# Patient Record
Sex: Male | Born: 1945 | Race: White | Hispanic: No | Smoking: Never smoker
Health system: Southern US, Community
[De-identification: ages and names within clinical notes are randomized; demographics above are authoritative.]

## PROBLEM LIST (undated history)

## (undated) ENCOUNTER — Emergency Department (HOSPITAL_COMMUNITY): Payer: Self-pay | Source: Home / Self Care

## (undated) DIAGNOSIS — I00 Rheumatic fever without heart involvement: Secondary | ICD-10-CM

## (undated) DIAGNOSIS — Q211 Atrial septal defect, unspecified: Secondary | ICD-10-CM

## (undated) DIAGNOSIS — I639 Cerebral infarction, unspecified: Secondary | ICD-10-CM

## (undated) DIAGNOSIS — E7800 Pure hypercholesterolemia, unspecified: Secondary | ICD-10-CM

## (undated) DIAGNOSIS — M545 Low back pain, unspecified: Secondary | ICD-10-CM

## (undated) DIAGNOSIS — H269 Unspecified cataract: Secondary | ICD-10-CM

## (undated) DIAGNOSIS — H9190 Unspecified hearing loss, unspecified ear: Secondary | ICD-10-CM

## (undated) DIAGNOSIS — IMO0001 Reserved for inherently not codable concepts without codable children: Secondary | ICD-10-CM

## (undated) DIAGNOSIS — M25519 Pain in unspecified shoulder: Secondary | ICD-10-CM

## (undated) DIAGNOSIS — M170 Bilateral primary osteoarthritis of knee: Secondary | ICD-10-CM

## (undated) DIAGNOSIS — I10 Essential (primary) hypertension: Secondary | ICD-10-CM

## (undated) DIAGNOSIS — H547 Unspecified visual loss: Secondary | ICD-10-CM

## (undated) HISTORY — DX: Bilateral primary osteoarthritis of knee: M17.0

## (undated) HISTORY — DX: Unspecified hearing loss, unspecified ear: H91.90

## (undated) HISTORY — DX: Reserved for inherently not codable concepts without codable children: IMO0001

## (undated) HISTORY — PX: ASD REPAIR: SHX258

## (undated) HISTORY — DX: Essential (primary) hypertension: I10

## (undated) HISTORY — DX: Pure hypercholesterolemia, unspecified: E78.00

## (undated) HISTORY — PX: KNEE SURGERY: SHX244

## (undated) HISTORY — DX: Unspecified visual loss: H54.7

## (undated) HISTORY — DX: Cerebral infarction, unspecified: I63.9

## (undated) HISTORY — PX: OTHER SURGICAL HISTORY: SHX169

## (undated) HISTORY — PX: CATARACT EXTRACTION: SUR2

---
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
## 1977-12-30 HISTORY — PX: APPENDECTOMY: SHX54

## 2007-02-26 ENCOUNTER — Ambulatory Visit: Payer: Self-pay | Admitting: Internal Medicine

## 2007-03-05 ENCOUNTER — Ambulatory Visit: Payer: Self-pay | Admitting: *Deleted

## 2007-03-17 ENCOUNTER — Ambulatory Visit: Payer: Self-pay | Admitting: Internal Medicine

## 2007-12-16 ENCOUNTER — Ambulatory Visit: Payer: Self-pay | Admitting: Internal Medicine

## 2007-12-31 HISTORY — PX: TOTAL KNEE ARTHROPLASTY: SHX125

## 2008-01-04 ENCOUNTER — Ambulatory Visit: Payer: Self-pay | Admitting: Internal Medicine

## 2008-01-04 LAB — CONVERTED CEMR LAB
Calcium: 9.4 mg/dL (ref 8.4–10.5)
Creatinine, Ser: 1.06 mg/dL (ref 0.40–1.50)
Potassium: 4.5 meq/L (ref 3.5–5.3)
Sodium: 142 meq/L (ref 135–145)

## 2008-02-01 ENCOUNTER — Ambulatory Visit: Payer: Self-pay | Admitting: Internal Medicine

## 2008-02-02 ENCOUNTER — Ambulatory Visit (HOSPITAL_COMMUNITY): Admission: RE | Admit: 2008-02-02 | Discharge: 2008-02-02 | Payer: Self-pay | Admitting: Cardiovascular Disease

## 2008-02-15 ENCOUNTER — Ambulatory Visit: Payer: Self-pay | Admitting: Internal Medicine

## 2008-03-14 DIAGNOSIS — I639 Cerebral infarction, unspecified: Secondary | ICD-10-CM

## 2008-03-14 HISTORY — DX: Cerebral infarction, unspecified: I63.9

## 2008-03-15 ENCOUNTER — Encounter: Admission: RE | Admit: 2008-03-15 | Discharge: 2008-03-15 | Payer: Self-pay | Admitting: Orthopedic Surgery

## 2008-03-17 ENCOUNTER — Inpatient Hospital Stay (HOSPITAL_COMMUNITY): Admission: RE | Admit: 2008-03-17 | Discharge: 2008-03-20 | Payer: Self-pay | Admitting: Orthopedic Surgery

## 2008-03-21 ENCOUNTER — Emergency Department (HOSPITAL_COMMUNITY): Admission: EM | Admit: 2008-03-21 | Discharge: 2008-03-21 | Payer: Self-pay | Admitting: Emergency Medicine

## 2008-03-31 ENCOUNTER — Encounter (INDEPENDENT_AMBULATORY_CARE_PROVIDER_SITE_OTHER): Payer: Self-pay | Admitting: Internal Medicine

## 2008-03-31 ENCOUNTER — Ambulatory Visit: Payer: Self-pay | Admitting: Vascular Surgery

## 2008-03-31 ENCOUNTER — Inpatient Hospital Stay (HOSPITAL_COMMUNITY): Admission: EM | Admit: 2008-03-31 | Discharge: 2008-04-05 | Payer: Self-pay | Admitting: Emergency Medicine

## 2008-04-04 ENCOUNTER — Ambulatory Visit: Payer: Self-pay | Admitting: Psychiatry

## 2008-04-05 ENCOUNTER — Inpatient Hospital Stay (HOSPITAL_COMMUNITY): Admission: AD | Admit: 2008-04-05 | Discharge: 2008-04-13 | Payer: Self-pay | Admitting: *Deleted

## 2008-04-05 ENCOUNTER — Ambulatory Visit: Payer: Self-pay | Admitting: *Deleted

## 2008-05-03 ENCOUNTER — Encounter: Admission: RE | Admit: 2008-05-03 | Discharge: 2008-05-24 | Payer: Self-pay | Admitting: Orthopedic Surgery

## 2008-05-25 ENCOUNTER — Ambulatory Visit: Payer: Self-pay | Admitting: Internal Medicine

## 2008-10-31 ENCOUNTER — Ambulatory Visit: Payer: Self-pay | Admitting: *Deleted

## 2008-11-03 ENCOUNTER — Encounter: Admission: RE | Admit: 2008-11-03 | Discharge: 2008-11-03 | Payer: Self-pay | Admitting: Orthopedic Surgery

## 2009-01-11 ENCOUNTER — Encounter: Payer: Self-pay | Admitting: Family Medicine

## 2009-01-11 ENCOUNTER — Ambulatory Visit: Payer: Self-pay | Admitting: Internal Medicine

## 2009-01-11 LAB — CONVERTED CEMR LAB
ALT: 18 units/L (ref 0–53)
AST: 17 units/L (ref 0–37)
Alkaline Phosphatase: 63 units/L (ref 39–117)
Calcium: 9.5 mg/dL (ref 8.4–10.5)
Creatinine, Ser: 0.99 mg/dL (ref 0.40–1.50)
Glucose, Bld: 90 mg/dL (ref 70–99)
HDL: 33 mg/dL — ABNORMAL LOW (ref >39)
Potassium: 4.4 meq/L (ref 3.5–5.3)
Sodium: 140 meq/L (ref 135–145)
Total CHOL/HDL Ratio: 4.1
Total Protein: 7.2 g/dL (ref 6.0–8.3)
Triglycerides: 138 mg/dL (ref <150)
VLDL: 28 mg/dL (ref 0–40)

## 2009-01-13 ENCOUNTER — Ambulatory Visit: Payer: Self-pay | Admitting: Internal Medicine

## 2009-01-18 ENCOUNTER — Ambulatory Visit (HOSPITAL_COMMUNITY): Admission: RE | Admit: 2009-01-18 | Discharge: 2009-01-18 | Payer: Self-pay | Admitting: Family Medicine

## 2009-01-19 ENCOUNTER — Encounter: Admission: RE | Admit: 2009-01-19 | Discharge: 2009-02-20 | Payer: Self-pay | Admitting: Family Medicine

## 2009-02-28 ENCOUNTER — Ambulatory Visit: Payer: Self-pay | Admitting: Internal Medicine

## 2009-02-28 ENCOUNTER — Encounter: Payer: Self-pay | Admitting: Family Medicine

## 2009-02-28 LAB — CONVERTED CEMR LAB
Alkaline Phosphatase: 76 units/L (ref 39–117)
BUN: 22 mg/dL (ref 6–23)
Band Neutrophils: 0 % (ref 0–10)
Basophils Absolute: 0 10*3/uL (ref 0.0–0.1)
CO2: 24 meq/L (ref 19–32)
Calcium: 9.2 mg/dL (ref 8.4–10.5)
Chloride: 102 meq/L (ref 96–112)
Cholesterol: 130 mg/dL (ref 0–200)
Creatinine, Ser: 0.97 mg/dL (ref 0.40–1.50)
Glucose, Bld: 73 mg/dL (ref 70–99)
HDL: 30 mg/dL — ABNORMAL LOW (ref >39)
Hemoglobin: 13.4 g/dL (ref 13.0–17.0)
LDL Cholesterol: 68 mg/dL (ref 0–99)
Lymphs Abs: 2.1 10*3/uL (ref 0.7–4.0)
Monocytes Absolute: 0.7 10*3/uL (ref 0.1–1.0)
Monocytes Relative: 10 % (ref 3–12)
Neutro Abs: 3.8 10*3/uL (ref 1.7–7.7)
Platelets: 220 10*3/uL (ref 150–400)
Sodium: 139 meq/L (ref 135–145)
Total CHOL/HDL Ratio: 4.3
VLDL: 32 mg/dL (ref 0–40)

## 2009-03-10 ENCOUNTER — Ambulatory Visit: Payer: Self-pay | Admitting: Internal Medicine

## 2009-07-07 ENCOUNTER — Ambulatory Visit: Payer: Self-pay | Admitting: Internal Medicine

## 2009-07-12 ENCOUNTER — Ambulatory Visit: Payer: Self-pay | Admitting: Family Medicine

## 2009-09-22 IMAGING — CR DG CHEST 2V
2 series · 2 of 2 positions shown · non-contrast
Comparison: None.

CLINICAL DATA: Hypertension. Preoperative evaluation.
 CHEST - 2 VIEW:

[w chest pa]
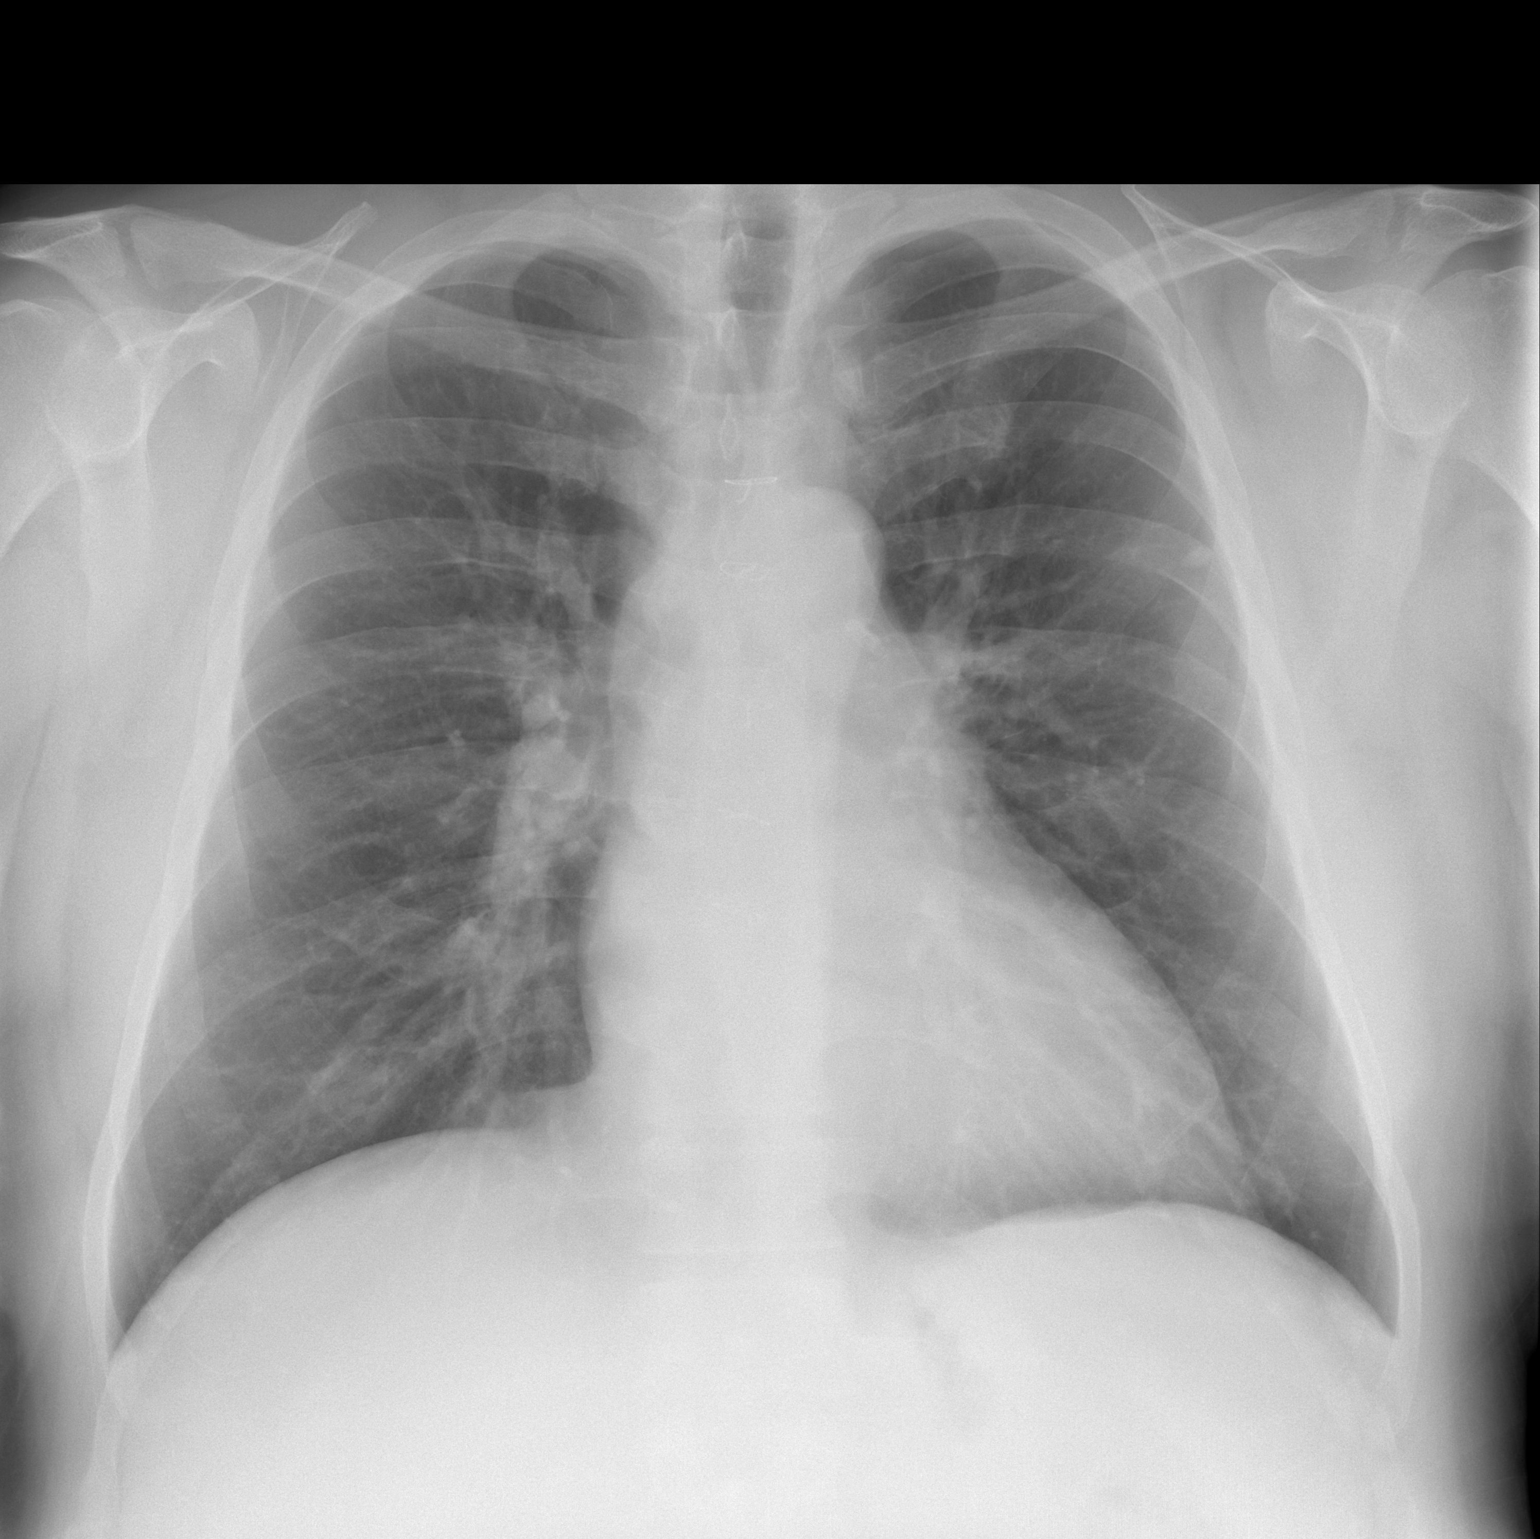

[w chest lat]
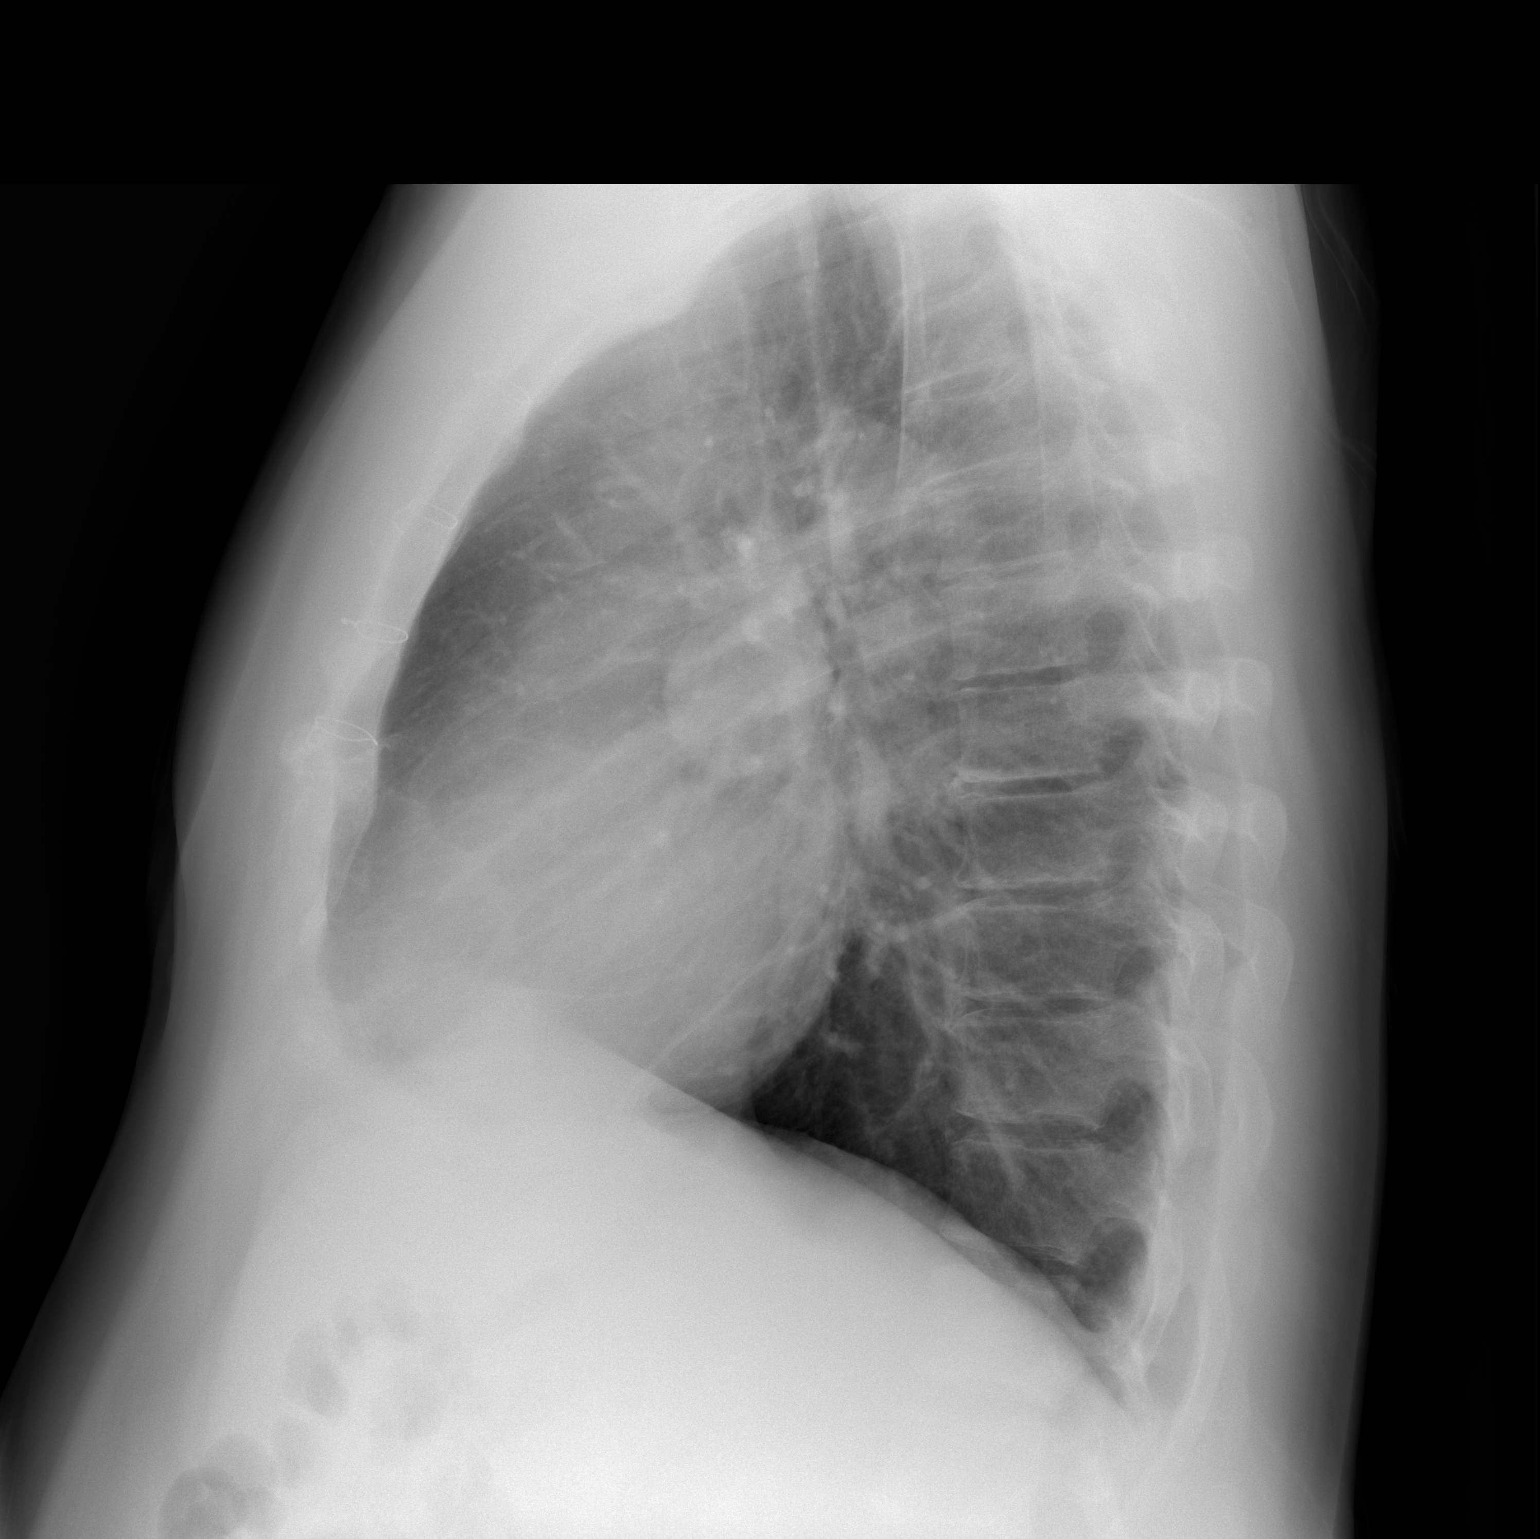

[2 of 2 positions shown; findings below may reference images not displayed]

FINDINGS: PA and lateral examinations are submitted for interpretation.
 The cardiac density is borderline in size. An 8 mm pulmonary nodular density projects over the lateral aspect of the posterior portion of the left 6th rib.  No other nodular opacities are seen. No pulmonary infiltrates or evident. There is no evidence of pleural effusion. There is slight flattening of the diaphragm on the lateral view consistent with minimal hyperinflation. There is minimal osteophyte formation of the spine representing degenerative spondylosis compatible with age.
IMPRESSION: 1.  Slight hyperinflation with no evidence of pulmonary edema or pneumonia.

## 2009-09-27 IMAGING — CT CT CHEST W/O CM
2 of 4 series · 15 of 36 positions shown, 18 images · IV contrast (agent unspecified)
Comparison: Chest x-ray, 03/10/08.

CLINICAL DATA: 61 year old with abnormal chest x-ray.  
CHEST CT WITHOUT CONTRAST:
TECHNIQUE: Multidetector CT imaging of the chest was performed following the standard protocol without IV contrast.

[Series 3: routine chest · axial · 0.70mm/px · z∈[-304,-19]mm · 12 of 67 slices shown, 15 images]
[im 5/67  mediastinal]
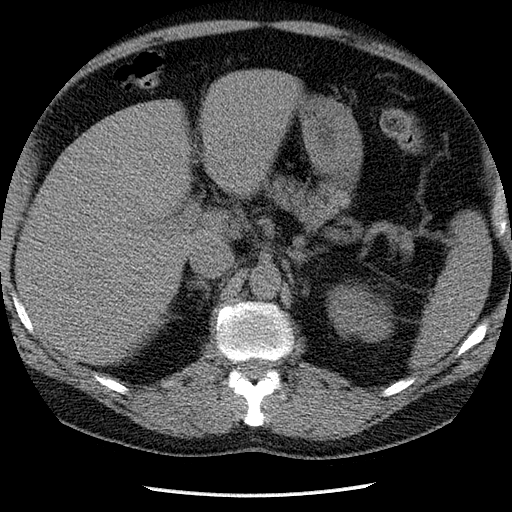
[im 5/67  lung]
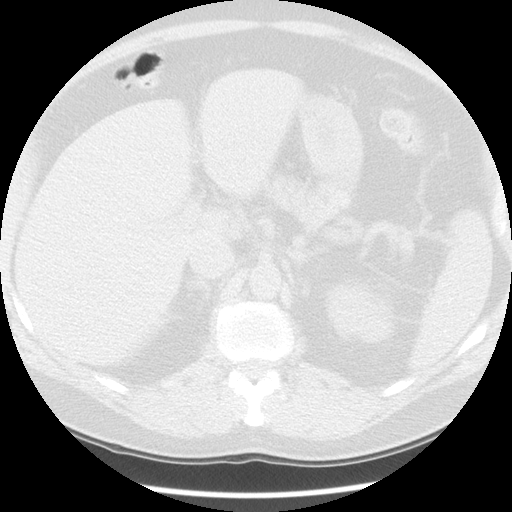
[im 10/67  lung]
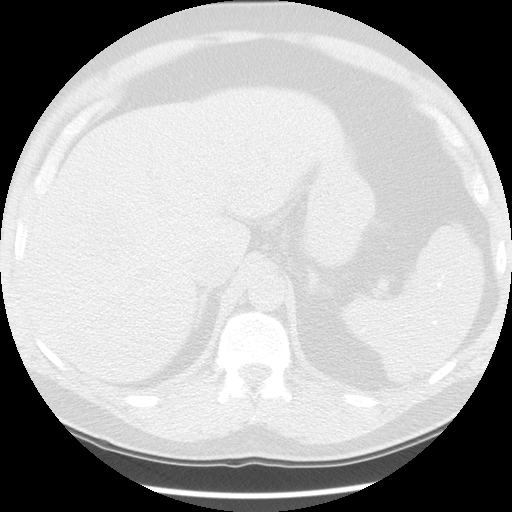
[im 15/67  lung]
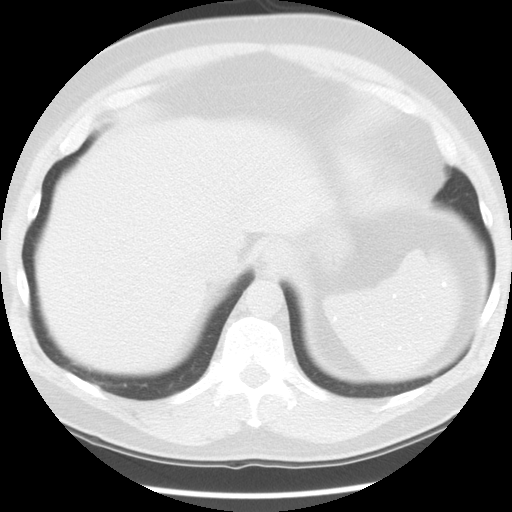
[im 19/67  lung]
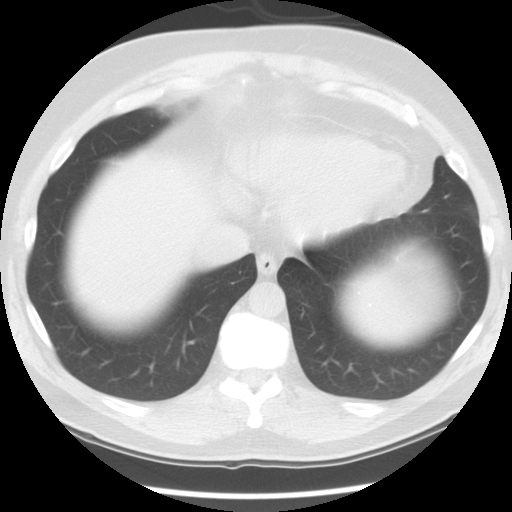
[im 24/67  mediastinal]
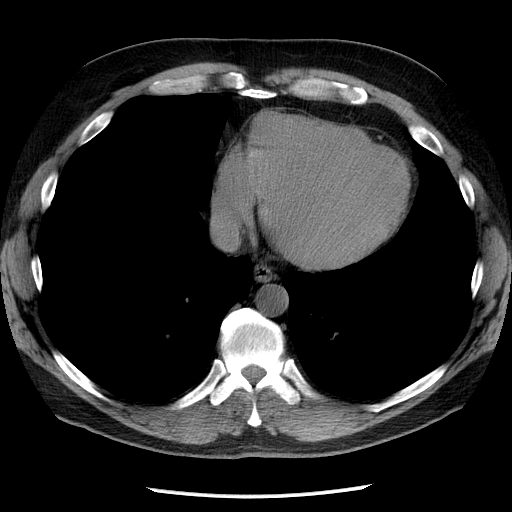
[im 24/67  lung]
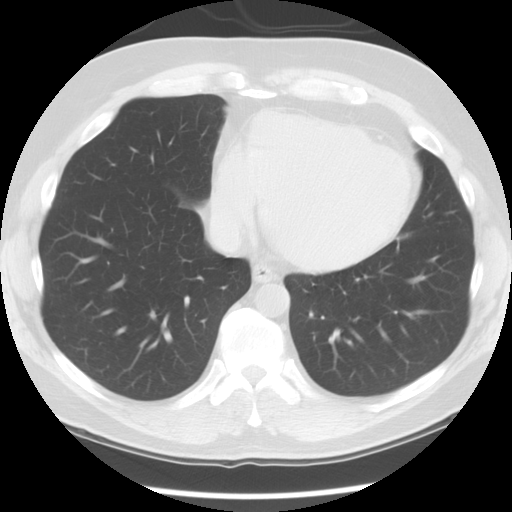
[im 29/67  lung]
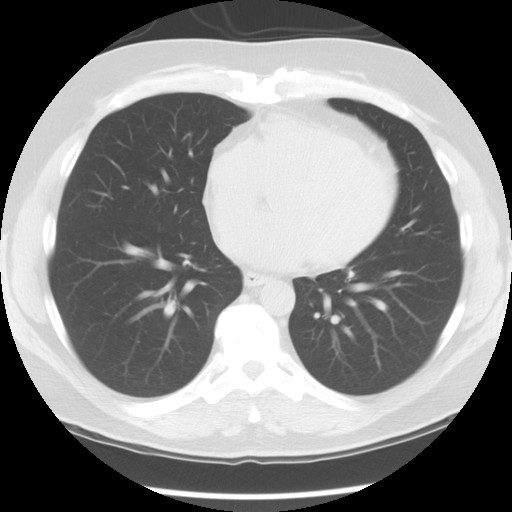
[im 38/67  lung]
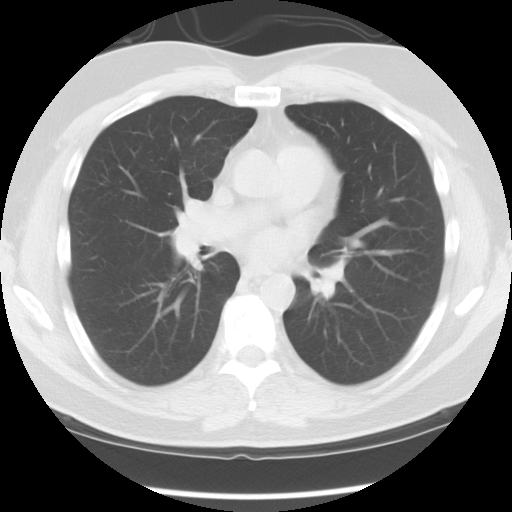
[im 43/67  lung]
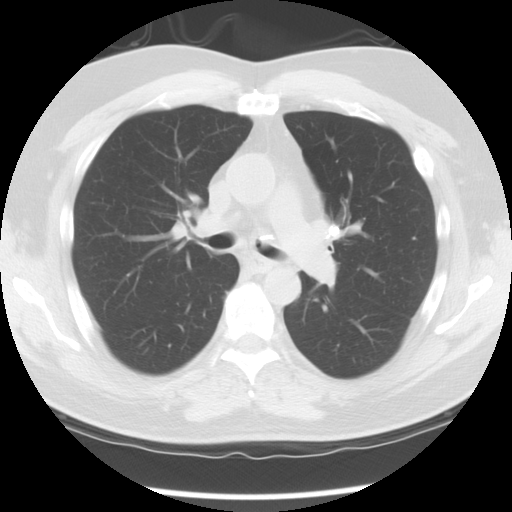
[im 48/67  mediastinal]
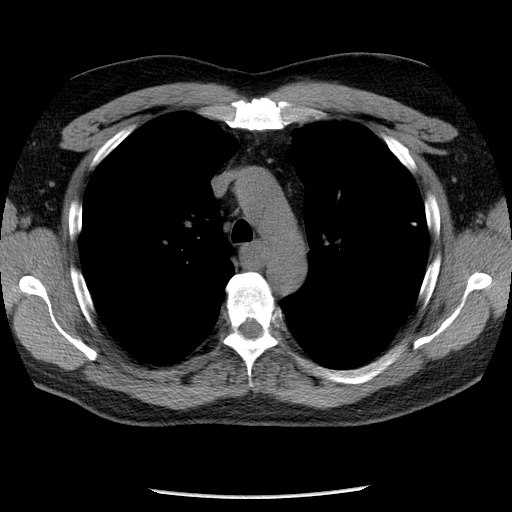
[im 48/67  lung]
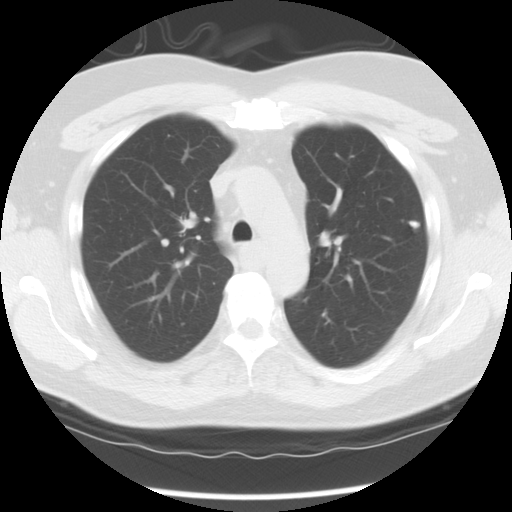
[im 52/67  lung]
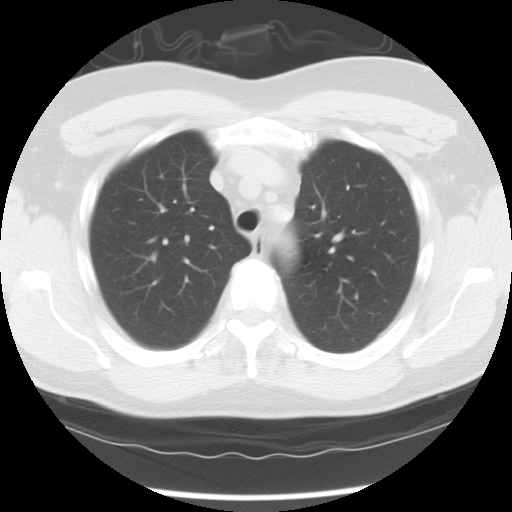
[im 57/67  lung]
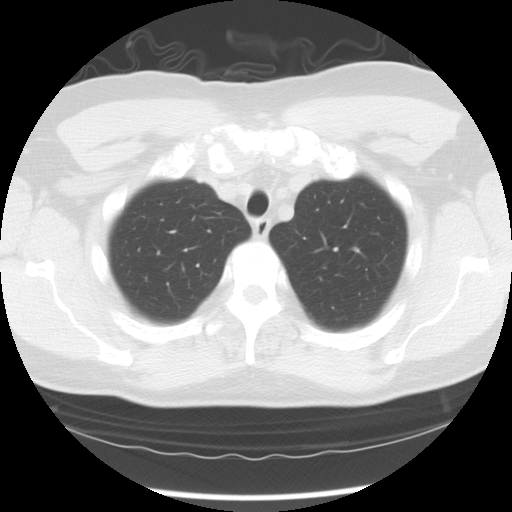
[im 62/67  lung]
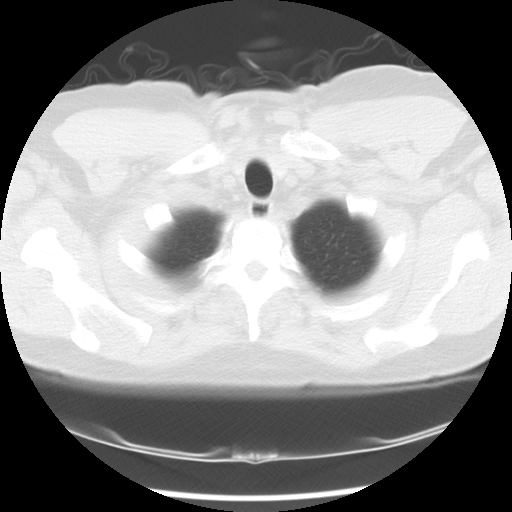

[Series 602: sagittal body · sagittal · 0.70mm/px · 3 of 145 slices shown]
[im 29/145  lung]
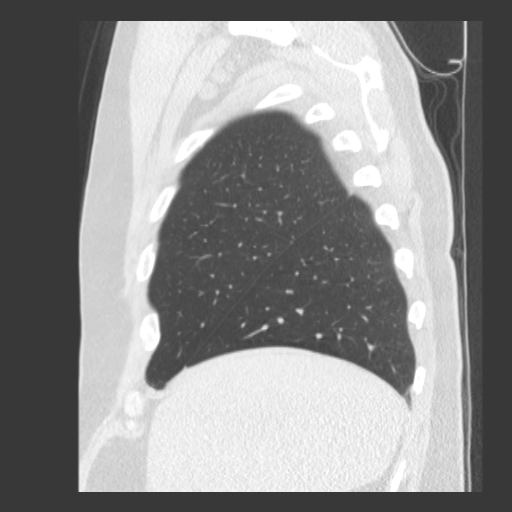
[im 58/145  lung]
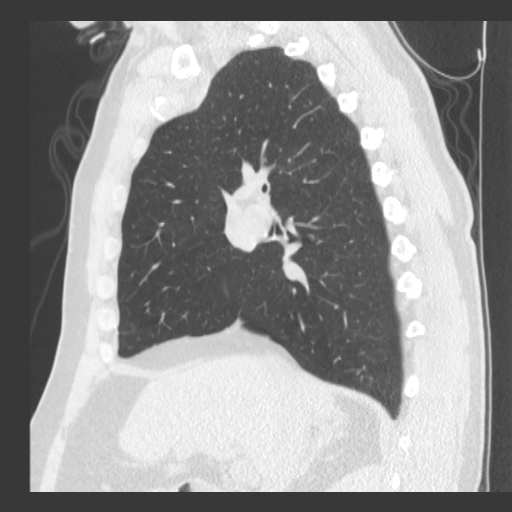
[im 87/145  lung]
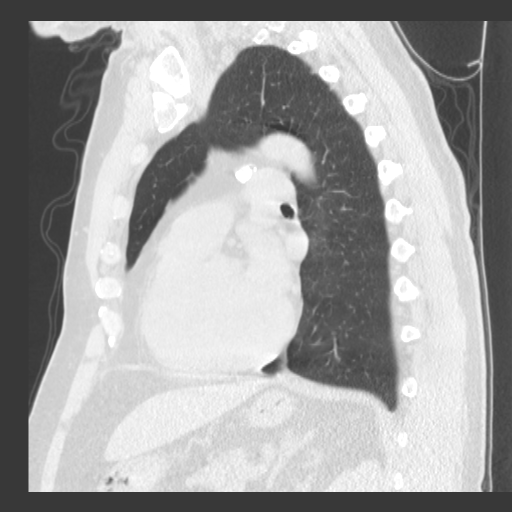

[15 of 36 positions shown; findings below may reference images not displayed]

FINDINGS: The patient has scattered calcifications throughout the mediastinum.  Evidence for a calcified node in the prevascular space.  Overall, there is no significant chest lymphadenopathy.  Negative for pericardial or pleural effusions.  The patient has undergone a median sternotomy in the past.  There is some tenting of the anterior pericardial fat as well as the right atrium along the caudal margin of the sternum.  Suspect these findings are related to scarring from the previous surgery.  There is a normal appearance of the adrenal tissue.  Multiple punctate calcifications scattered throughout the splenic parenchyma.  There may be a small accessory spleen along the inferior margin of the spleen.  Evaluation of the upper abdominal structures is limited, however, there is slightly decreased attenuation in the anterior right hepatic lobe seen on sequence 3, image #58.  This area of low density is only appreciated with liver windows.  Difficult to exclude an underlying lesion. The trachea and mainstem bronchi are patent.  The area of concern on the recent chest radiograph represents a 7 mm calcification in the left upper lobe.  The lungs are otherwise clear with exception of a 3 mm nodular density along the top of the right major fissure on sequence 4, image #21.  No acute bony abnormalities.
IMPRESSION: 1.  The nodule in the left upper lobe is calcified and consistent with a benign granuloma.  There is evidence of old granulomatous disease throughout the mediastinum as well as the spleen. 
2.  No acute chest findings.
3.  There is a punctate density along the right major fissure that measures approximately 3 mm.  If the patient is low risk for malignancy, this does not require follow-up.  
4.  Decreased density in the right hepatic lobe concerning for an underlying lesion.  
.

## 2009-11-14 ENCOUNTER — Inpatient Hospital Stay (HOSPITAL_COMMUNITY): Admission: EM | Admit: 2009-11-14 | Discharge: 2009-11-15 | Payer: Self-pay | Admitting: Emergency Medicine

## 2009-12-13 ENCOUNTER — Ambulatory Visit: Payer: Self-pay | Admitting: Internal Medicine

## 2010-04-23 ENCOUNTER — Ambulatory Visit (HOSPITAL_COMMUNITY): Admission: RE | Admit: 2010-04-23 | Discharge: 2010-04-23 | Payer: Self-pay | Admitting: Internal Medicine

## 2010-04-23 ENCOUNTER — Ambulatory Visit: Payer: Self-pay | Admitting: Internal Medicine

## 2011-01-20 ENCOUNTER — Encounter: Payer: Self-pay | Admitting: Cardiovascular Disease

## 2011-02-26 ENCOUNTER — Encounter (INDEPENDENT_AMBULATORY_CARE_PROVIDER_SITE_OTHER): Payer: Self-pay | Admitting: *Deleted

## 2011-03-05 ENCOUNTER — Encounter (INDEPENDENT_AMBULATORY_CARE_PROVIDER_SITE_OTHER): Payer: Self-pay | Admitting: *Deleted

## 2011-03-07 ENCOUNTER — Encounter: Payer: Self-pay | Admitting: Internal Medicine

## 2011-03-07 NOTE — Letter (Signed)
Summary: Pre Visit Letter Revised  [facility]  [address] [place], [place] [address]   Phone: [phone]  Fax: [phone]        02/26/2011 MRN: [id number] Dean Fry [address] [place], [place]  [address]                          Procedure Date:  March 19, 2011                 dir col- Dr Juanda Chance                 Welcome to the Gastroenterology Division at [facility].    You are scheduled to see a nurse for your pre-procedure visit on March 05, 2011 at 1:30pm on the 3rd floor at [facility], [address]  We ask that you try to arrive at our office 15 minutes prior to your appointment time to allow for check-in.  Please take a minute to review the attached form.  If you answer "Yes" to one or more of the questions on the first page, we ask that you call the person listed at your earliest opportunity.  If you answer "No" to all of the questions, please complete the rest of the form and bring it to your appointment.    Your nurse visit will consist of discussing your medical and surgical history, your immediate family medical history, and your medications.   If you are unable to list all of your medications on the form, please bring the medication bottles to your appointment and we will list them.  We will need to be aware of both prescribed and over the counter drugs.  We will need to know exact dosage information as well.    Please be prepared to read and sign documents such as consent forms, a financial agreement, and acknowledgement forms.  If necessary, and with your consent, a friend or relative is welcome to sit-in on the nurse visit with you.  Please bring your insurance card so that we may make a copy of it.  If your insurance requires a referral to see a specialist, please bring your referral form from your primary care physician.  No co-pay is required for this nurse visit.     If you cannot keep your appointment, please  call [phone] to cancel or reschedule prior to your appointment date.  This allows [place] the opportunity to schedule an appointment for another patient in need of care.    Thank you for choosing  Gastroenterology for your medical needs.  We appreciate the opportunity to care for you.  Please visit [place] at our website  to learn more about our practice.  Sincerely, The Gastroenterology Division

## 2011-03-12 NOTE — Letter (Signed)
Summary: [facility] Instructions  [facility]  [address] [place], [place] [address]   Phone: [phone]  Fax: [phone]       Dean Fry    [birth year]    MRN: [id number]        Procedure Day Dorna Bloom: Jake Shark  03/19/11     Arrival Time:  2:00pm     Procedure Time: 3:00pm     Location of Procedure:                    _x _  [facility] (4th Floor)                       PREPARATION FOR COLONOSCOPY WITH MOVIPREP   Starting 5 days prior to your procedure  Thursday  03/15  do not eat nuts, seeds, popcorn, corn, beans, peas,  salads, or any raw vegetables.  Do not take any fiber supplements (e.g. Metamucil, Citrucel, and Benefiber).  THE DAY BEFORE YOUR PROCEDURE         DATE: MONDAY 03/19  1.  Drink clear liquids the entire day-NO SOLID FOOD  2.  Do not drink anything colored red or purple.  Avoid juices with pulp.  No orange juice.  3.  Drink at least 64 oz. (8 glasses) of fluid/clear liquids during the day to prevent dehydration and help the prep work efficiently.  CLEAR LIQUIDS INCLUDE: Water Jello Ice Popsicles Tea (sugar ok, no milk/cream) Powdered fruit flavored drinks Coffee (sugar ok, no milk/cream) Gatorade Juice: apple, white grape, white cranberry  Lemonade Clear bullion, consomm, broth Carbonated beverages (any kind) Strained chicken noodle soup Hard Candy                             4.  In the morning, mix first dose of MoviPrep solution:    Empty 1 Pouch A and 1 Pouch B into the disposable container    Add lukewarm drinking water to the top line of the container. Mix to dissolve    Refrigerate (mixed solution should be used within 24 hrs)  5.  Begin drinking the prep at 5:00 p.m. The MoviPrep container is divided by 4 marks.   Every 15 minutes drink the solution down to the next mark (approximately 8 oz) until the full liter is complete.   6.  Follow completed prep with 16 oz of clear liquid of your choice  (Nothing red or purple).  Continue to drink clear liquids until bedtime.  7.  Before going to bed, mix second dose of MoviPrep solution:    Empty 1 Pouch A and 1 Pouch B into the disposable container    Add lukewarm drinking water to the top line of the container. Mix to dissolve    Refrigerate  THE DAY OF YOUR PROCEDURE      DATE: TUESDAY  03/20  Beginning at  10:00 a.m. (5 hours before procedure):         1. Every 15 minutes, drink the solution down to the next mark (approx 8 oz) until the full liter is complete.  2. Follow completed prep with 16 oz. of clear liquid of your choice.    3. You may drink clear liquids until 1:00pm  (2 HOURS BEFORE PROCEDURE).   MEDICATION INSTRUCTIONS  Unless otherwise instructed, you should take regular prescription medications with a small sip of water   as early as possible the  morning of your procedure.   Additional medication instructions: Do not take Lisiniopril/HCTZ day of procedure.       OTHER INSTRUCTIONS  You will need a responsible adult at least 66 years of age to accompany you and drive you home.   This person must remain in the waiting room during your procedure.  Wear loose fitting clothing that is easily removed.  Leave jewelry and other valuables at home.  However, you may wish to bring a book to read or  an iPod/MP3 player to listen to music as you wait for your procedure to start.  Remove all body piercing jewelry and leave at home.  Total time from sign-in until discharge is approximately 2-3 hours.  You should go home directly after your procedure and rest.  You can resume normal activities the  day after your procedure.  The day of your procedure you should not:   Drive   Make legal decisions   Operate machinery   Drink alcohol   Return to work  You will receive specific instructions about eating, activities and medications before you leave.    The above instructions have been reviewed and  explained to me by   _______________________ Instructions reviewed w/ pt by Sadie Haber.  Computer was locked during PV.  Pt verbalized instructions   I fully understand and can verbalize these instructions _____________________________ Date _________

## 2011-03-12 NOTE — Miscellaneous (Signed)
Summary: [facility] PV  Clinical Lists Changes  Medications: Added new medication of MOVIPREP 100 GM  SOLR (PEG-KCL-NACL-NASULF-NA ASC-C) As per prep instructions. - Signed Rx of MOVIPREP 100 GM  SOLR (PEG-KCL-NACL-NASULF-NA ASC-C) As per prep instructions.;  #1 x 0;  Signed;  Entered by: Ezra Sites RN;  Authorized by: Hart Carwin MD;  Method used: Electronically to [facility] [address], [place], [place]  [id number], Ph: [phone], Fax: [phone] Observations: Added new observation of NKA: T (03/07/2011 14:27)    Prescriptions: MOVIPREP 100 GM  SOLR (PEG-KCL-NACL-NASULF-NA ASC-C) As per prep instructions.  #1 x 0   Entered by:   Ezra Sites RN   Authorized by:   Hart Carwin MD   Signed by:   Ezra Sites RN on 03/07/2011   Method used:   Electronically to        [facility] (retail)       [address]       [place], [place]  [id number]       Ph: [phone]       Fax: [phone]   RxID:   [id number]

## 2011-03-19 ENCOUNTER — Encounter: Payer: Self-pay | Admitting: Internal Medicine

## 2011-03-19 ENCOUNTER — Ambulatory Visit: Payer: Medicare Other | Admitting: Internal Medicine

## 2011-03-19 VITALS — BP 157/73 | HR 65 | Temp 98.9°F | Resp 16 | Ht 72.0 in | Wt 254.0 lb

## 2011-03-19 DIAGNOSIS — K5730 Diverticulosis of large intestine without perforation or abscess without bleeding: Secondary | ICD-10-CM

## 2011-03-19 DIAGNOSIS — Z1211 Encounter for screening for malignant neoplasm of colon: Secondary | ICD-10-CM

## 2011-03-19 NOTE — Patient Instructions (Signed)
MODERATE DIVERTICULOSIS IN THE SIGMOID COLON OTHERWISE NORMAL EXAM  PLEASE EAT A DIET THAT IS HIGH IN FIBER  REPEAT EXAM IN 10 YEARS-2022

## 2011-03-20 ENCOUNTER — Telehealth: Payer: Self-pay | Admitting: *Deleted

## 2011-03-20 NOTE — Telephone Encounter (Signed)

## 2011-03-28 NOTE — Procedures (Signed)
Summary: Colonoscopy  Patient: [name] Note: All result statuses are Final unless otherwise noted.  Tests: (1) Colonoscopy (COL)   COL Colonoscopy           DONE      [facility]     [address]     [place], [place]  [address]          COLONOSCOPY PROCEDURE REPORT          PATIENT:  Dean Fry, Dean Fry  MR#:  [id number]     BIRTHDATE:  November 20, 1946, 64 yrs. old  GENDER:  male     ENDOSCOPIST:  Hedwig Morton. Juanda Chance, MD     REF. BY:     PROCEDURE DATE:  03/19/2011     PROCEDURE:  Colonoscopy [address]     ASA CLASS:  Class I     INDICATIONS:  Routine Risk Screening     MEDICATIONS:   Versed 8 mg, Fentanyl 50 mcg          DESCRIPTION OF PROCEDURE:   After the risks benefits and     alternatives of the procedure were thoroughly explained, informed     consent was obtained.  Digital rectal exam was performed and     revealed no rectal masses.   The LB 180AL [id number] endoscope was     introduced through the anus and advanced to the cecum, which was     identified by both the appendix and ileocecal valve, without     limitations.  The quality of the prep was Moviprep fair.  The     instrument was then slowly withdrawn as the colon was fully     examined.<<PROCEDUREIMAGES>>          FINDINGS:  Moderate diverticulosis was found in the sigmoid colon     (see image1, image5, and image6).  This was otherwise a normal     examination of the colon (see image8, image4, image3, and image2).     Retroflexed views in the rectum revealed no abnormalities.    The     scope was then withdrawn from the patient and the procedure     completed.          COMPLICATIONS:  None     ENDOSCOPIC IMPRESSION:     1) Moderate diverticulosis in the sigmoid colon     2) Otherwise normal examination     RECOMMENDATIONS:     1) high fiber diet     REPEAT EXAM:  In 10 year(s) for.          ______________________________     Hedwig Morton. Juanda Chance, MD          CC:          n.     eSIGNED:   Hedwig Morton. Ashaya Raftery at 03/19/2011 03:37 PM          Brigandi, Mellody Dance, [phone]  Note: An exclamation mark (!) indicates a result that was not dispersed into the flowsheet. Document Creation Date: 03/19/2011 3:37 PM _______________________________________________________________________  (1) Order result status: Final Collection or observation date-time: 03/19/2011 15:13 Requested date-time:  Receipt date-time:  Reported date-time:  Referring Physician:   Ordering Physician: Lina Sar [id number]) Specimen Source:  Source: Launa Grill Order Number: [id number] Lab site:

## 2011-04-03 LAB — BASIC METABOLIC PANEL
BUN: 29 mg/dL — ABNORMAL HIGH (ref 6–23)
BUN: 43 mg/dL — ABNORMAL HIGH (ref 6–23)
BUN: 41 mg/dL — ABNORMAL HIGH (ref 6–23)
CO2: 17 mEq/L — ABNORMAL LOW (ref 19–32)
CO2: 20 mEq/L (ref 19–32)
CO2: 17 mEq/L — ABNORMAL LOW (ref 19–32)
Calcium: 8.4 mg/dL (ref 8.4–10.5)
Chloride: 106 mEq/L (ref 96–112)
Chloride: 112 mEq/L (ref 96–112)
Chloride: 105 mEq/L (ref 96–112)
Creatinine, Ser: 2.91 mg/dL — ABNORMAL HIGH (ref 0.4–1.5)
Creatinine, Ser: 3.64 mg/dL — ABNORMAL HIGH (ref 0.4–1.5)
GFR calc Af Amer: 27 mL/min — ABNORMAL LOW (ref >60)
GFR calc non Af Amer: 22 mL/min — ABNORMAL LOW (ref >60)
Glucose, Bld: 91 mg/dL (ref 70–99)
Potassium: 3.6 mEq/L (ref 3.5–5.1)
Potassium: 4.2 mEq/L (ref 3.5–5.1)
Sodium: 132 mEq/L — ABNORMAL LOW (ref 135–145)
Sodium: 138 mEq/L (ref 135–145)

## 2011-04-03 LAB — URINALYSIS, ROUTINE W REFLEX MICROSCOPIC
Glucose, UA: NEGATIVE mg/dL
Ketones, ur: 15 mg/dL — AB
Protein, ur: 100 mg/dL — AB

## 2011-04-03 LAB — COMPREHENSIVE METABOLIC PANEL
AST: 24 U/L (ref 0–37)
BUN: 40 mg/dL — ABNORMAL HIGH (ref 6–23)
CO2: 19 mEq/L (ref 19–32)
Chloride: 106 mEq/L (ref 96–112)
Creatinine, Ser: 3.73 mg/dL — ABNORMAL HIGH (ref 0.4–1.5)
GFR calc non Af Amer: 17 mL/min — ABNORMAL LOW (ref >60)
Glucose, Bld: 121 mg/dL — ABNORMAL HIGH (ref 70–99)
Total Bilirubin: 1.8 mg/dL — ABNORMAL HIGH (ref 0.3–1.2)

## 2011-04-03 LAB — DIFFERENTIAL
Basophils Absolute: 0 10*3/uL (ref 0.0–0.1)
Basophils Relative: 0 % (ref 0–1)
Eosinophils Absolute: 0.2 10*3/uL (ref 0.0–0.7)
Eosinophils Relative: 1 % (ref 0–5)
Lymphocytes Relative: 9 % — ABNORMAL LOW (ref 12–46)
Monocytes Absolute: 0.7 10*3/uL (ref 0.1–1.0)
Monocytes Relative: 7 % (ref 3–12)
Neutro Abs: 11.1 10*3/uL — ABNORMAL HIGH (ref 1.7–7.7)
Neutrophils Relative %: 85 % — ABNORMAL HIGH (ref 43–77)

## 2011-04-03 LAB — POCT CARDIAC MARKERS
Myoglobin, poc: >500 ng/mL (ref 12–200)
Troponin i, poc: <0.05 ng/mL (ref 0.00–0.09)

## 2011-04-03 LAB — CBC
HCT: 37.2 % — ABNORMAL LOW (ref 39.0–52.0)
HCT: 44.9 % (ref 39.0–52.0)
Hemoglobin: 13.6 g/dL (ref 13.0–17.0)
Hemoglobin: 15.6 g/dL (ref 13.0–17.0)
MCHC: 34 g/dL (ref 30.0–36.0)
MCHC: 34.6 g/dL (ref 30.0–36.0)
MCV: 87.7 fL (ref 78.0–100.0)
MCV: 87.9 fL (ref 78.0–100.0)
MCV: 87.4 fL (ref 78.0–100.0)
RBC: 4.24 MIL/uL (ref 4.22–5.81)
RBC: 4.54 MIL/uL (ref 4.22–5.81)
RBC: 5.14 MIL/uL (ref 4.22–5.81)
WBC: 7.3 10*3/uL (ref 4.0–10.5)
WBC: 13 10*3/uL — ABNORMAL HIGH (ref 4.0–10.5)

## 2011-04-03 LAB — URINE MICROSCOPIC-ADD ON

## 2011-04-03 LAB — POCT I-STAT, CHEM 8
Calcium, Ion: 1.13 mmol/L (ref 1.12–1.32)
Chloride: 111 mEq/L (ref 96–112)
HCT: 48 % (ref 39.0–52.0)

## 2011-04-03 LAB — LIPASE, BLOOD: Lipase: 20 U/L (ref 11–59)

## 2011-05-14 NOTE — Op Note (Signed)
NAME:  Dean Fry, Dean Fry           ACCOUNT NO.:  [id number]   MEDICAL RECORD NO.:  [id number]          PATIENT TYPE:  INP   LOCATION:  1606                         FACILITY:  [facility]   PHYSICIAN:  John L. Rendall, M.D.  DATE OF BIRTH:  Feb 08, 1946   DATE OF PROCEDURE:  03/17/2008  DATE OF DISCHARGE:                               OPERATIVE REPORT   PREOPERATIVE DIAGNOSIS:  End-stage osteoarthritis right knee, status  post high tibial osteotomy with lateral staple.   POSTOPERATIVE DIAGNOSIS:  End-stage osteoarthritis right knee, status  post high tibial osteotomy with lateral staple.   SURGICAL PROCEDURES:  Removal lateral stable and LCS total knee  replacement using computer navigation assistance.   SURGEON:  John L. Rendall, M.D.   ASSISTANT:  Legrand Pitts. Duffy, P.A.C.   ANESTHESIA:  General.   PATHOLOGY:  The patient has had a proximal high tibial closing osteotomy  with the lateral step staple.  The step staple was been in now for  virtually 20 years and is overgrown with bone.  In addition, the total  knee has abundant scar fibrosis and spurring and enlargement of the  lateral femoral and medial femoral condyle posteriorly causing great  deal of difficulty and final balancing and debridement of the knee.   PROCEDURE:  Under general anesthesia, the right leg was prepared with  DuraPrep and draped as a sterile field with using a proximal thigh  sterile tourniquet.  Legs wrapped out with the Esmarch and tourniquet is  used at 350 mm.  First the portion of the old lateral tibial osteotomy  incision is reopened approximately an inch and half of it.  The step  staple is identified beneath the periosteum and dissection about it is  done.  It was necessary to make a vertical incision towards the medial  side to get down to the length of the step staple.  Once it was  undermined, it was grasped with pliers and literally pulled straight  out.  Fortunately, it came out intact but it took  considerable removal  of bone overgrowth to get it ready and considerable undermining to work  it loose as it was solidly fixed.  This took about 30 minutes.  Once  this was completed, attention was turned to the joint itself.  A midline  incision was made going deep medial parapatellar.  The patella was  everted.  The femur was sized to a large plus.  Debridement was done in  preparation for computer mapping.  Then two Schanz pins were placed  through accessory punctures in the superior medial tibia and  inferomedial femur.  The arrays were set up.  The femoral head and  medial and lateral malleoli were identified.  Proximal tibia and distal  femur were mapped, all within 0.5 mm of reproducible accuracy.  Once  this was completed, the proximal tibia was resected using the computer  as a guide to remove 9 mm of proximal tibia.  This was a greatly helpful  as the proximal tibia was very deformed by the old osteotomy.  The pins  were left in place in case it was necessary  to remove even more bone  later because of the extensive scarring about the posterior and medial  side of the knee.  Once the proximal tibia was resected and the large  fragment removed, balancing was carried out both in flexion and  extension using the first femoral guide, anterior and posterior flare of  the distal femur were resected and at this point it was noted that it  was very difficult to even get a 10 mm spacer in the flexion gap.  An  additional 2.5 mm were taken off the proximal tibia at this point.  Then  the balancing block worked well at the flexion gap.  The extension cut  was then made using the distal femoral guide.  Once this was completed,  the lamina spreader was inserted.  Remnants of menisci and cruciates  were resected.  A horrendous amount of spurring was encountered both in  the inner condylar notch and posteriorly.  As this spurring and the  totally buried PCL were essentially dugout from  overhanging spur, the  knee developed better range of motion and came out into extension  better.  At this point, the recessing guide was used.  The proximal  tibia was then exposed.  It fit a #5, first a number 10 bearing was  inserted and on top of the #5 tibial plate.  With this in place, the  femoral component was inserted.  It should be noted that in flexion the  tibial tray flipped up in front and this was felt to be a result of bone  spurs on the back of the femur.  Everything was removed at this point  and more debridement of the back of the femoral condyles was done  removing at least 2 x 1 cm additional spurring on either side.  Once  this was completed, the posterior recesses were freed up much better and  a 12.5 bearing at this point went in quite nicely.  The patella was  osteotomized and the three peg hole patella was inserted.  Trial  reduction of all components at this time reveals excellent alignment  within 1.5 degrees of anatomic axis and full extension.  Permanent  components were obtained.  All were then cemented in place.  After  excess cement was removed, the tourniquet was let down at 2 hours 10  minutes.  Multiple small vessels were cauterized.  a medium Hemovac was  inserted.  The knee was then closed in layers with #1 Tycron, #1 Vicryl,  2-0 Vicryl and skin clips.  The patient returned to recovery in good  condition.      John L. Rendall, M.D.  Electronically Signed     JLR/MEDQ  D:  03/17/2008  T:  03/17/2008  Job:  [id number]

## 2011-05-14 NOTE — Discharge Summary (Signed)
NAME:  DERK, DOUBEK           ACCOUNT NO.:  [id number]   MEDICAL RECORD NO.:  [id number]          PATIENT TYPE:  INP   LOCATION:  1515                         FACILITY:  [facility]   PHYSICIAN:  John L. Rendall, M.D.  DATE OF BIRTH:  1946/03/25   DATE OF ADMISSION:  03/17/2008  DATE OF DISCHARGE:  03/20/2008                               DISCHARGE SUMMARY   ADMISSION DIAGNOSES:  1. End-stage osteoarthritis right knee.  2. Hypertension.  3. History of atrial septal defect treated with surgery.  4. Dyslipidemia.  5. Hard of hearing.   DISCHARGE DIAGNOSES:  1. End-stage osteoarthritis right knee status post right total knee      arthroplasty.  2. Acute blood loss anemia secondary to surgery.  3. Possible liver lesion.  4. Hypertension.  5. History of atrioseptal defect treated with surgery.  6. Dyslipidemia.  7. Hard of hearing.   SURGICAL PROCEDURES:  1. On March 17, 2008 Mr. Osterlund underwent a removal of an HTO      staple and a right total knee arthroplasty with computer navigation      by Dr. Jonny Ruiz L.  Rendall assisted by Arnoldo Morale, PA-C.  He had a      DePuy LPS complete primary femoral component cemented size large      plus right, placed with an LPS complete RP, insert size large plus,      12.5-mm thickness.  A DePuy NBT keel tibial tray cemented size 5      stem, and a metal back patella cemented size large plus.   COMPLICATIONS:  None.   CONSULTANTS:  1. Physical therapy and case management consult March 18, 2008.  2. Occupational therapy consult March 19, 2008.   HISTORY OF PRESENT ILLNESS:  This 65 year old white male patient  presented to Dr. Priscille Kluver with a 20-year history of gradual onset  progressive right knee pain.  He has a history of an old injury and  surgery on the knee in 1969, 1990, and 1997.  The pain, now, is a  constant ache-to-throb, grade 2-9/10 over the medial joint line without  radiation.  It increases with activity and weightbearing,  and decreases  with riding his exercise bike.  The knee grinds, pops, gives way, and  keeps him up at night.  He has failed conservative treatment, and x-rays  show end-stage arthritic changes of the knee.  Because of this, he is  presenting for a right knee replacement.   HOSPITAL COURSE:  Mr. Grimley tolerated his surgical procedure well  without immediate postoperative complications.  He was transferred to  the orthopedic floor.  On postop day #1 he was having very little pain,  T-max 100.5, vitals stable.  Hemoglobin 10.3, hematocrit 29.8.  Chest CT  was done due to preop chest x-ray.  It showed no acute findings in the  chest, but a  punctate density in the right major fissure,  and then a  decrease low-density hepatic lobe with possible lesion.  They  recommended an ultrasound and that was ordered.  Hemovac was then placed  to the right leg.  The leg was neurovascularly  intact.  He was weaned  off his oxygen and switched to p.o. pain medicine.   On postop day #2 he was doing well with minimal complaints of pain.  T-  max 101.8.  vitals stable.  Hemoglobin 9.4, hematocrit 27.2.  Leg was  neurovascularly intact.  Dressing was changed.  There was possibly a  lesion noted on his liver with the ultrasound, so a CT scan was ordered.  He was switched totally to p.o. pain meds, and plans were made for  possible discharge home the next day.   Postop day #3 he remained afebrile, vitals stable.  He was doing well  with therapy; and it was felt he was ready for discharge home and was  discharged home later that day.   DISCHARGE INSTRUCTIONS:  Diet:  He can resume his regular  prehospitalization diet.   MEDICATIONS:  He may resume his home meds as follows:  1. NO nabumetone at this time.  2. Pravastatin 40 mg p.o. b.i.d.  3. Lisinopril/HCT 10/25 mg p.o. q.a.m..  Additional meds at this time include:  1. Arixtra 2.5 mg subcu at 8 a.m. with the last dose March 26.  2. On the 27th he  can start a baby aspirin a day for 1 month.  3. OxyContin 10 mg 1 tablet p.o. q.12 h. #24 with no refill.  4. Percocet 5/325 to take 1-2 tablets p.o. q.4 h. p.r.n. for pain #60      with no refill.  5. Celebrex 200 mg p.o. b.i.d. #30 with no refill.  6. Lyrica 50 mg 1 tablet p.o. b.i.d. #14 with no refill.  7. Robaxin 500 mg 1-2 tablets p.o. q.6 h. p.r.n. for spasms.   ACTIVITY:  He can be out-of-bed weightbearing as tolerated on the right  leg with the walker.  He is to have home health PT per [facility], and home  CPM 0-90 degrees, q.6-8 h. hours a day.  Please see the blue total knee  discharge sheet for further activity instructions.   WOUND CARE:  He may shower after no drainage from the wound for 2 days.  Please see the blue total knee discharge sheet for further wound care  instructions.   FOLLOWUP:  He needs follow up with Dr. Priscille Kluver in our office on Tuesday  March 31 and needs to call [phone] for that appointment.   LABORATORY DATA:  Hemoglobin/hematocrit ranged from 12.4 and 35.2 on the  12th to 8.6 and 25.1 on the 22nd.  White count remained within normal  limits, but platelets dropped to a low of 135 on the 22nd.   Glucose ranged from 110 on the 12th to 103 on the 21st.  Calcium dropped  to a low of 7.8 on the 20th and then went to 7.9.   Chest x-ray done on March 12 showed slight hyperinflation with no  evidence of pulmonary edema or pneumonia, but a small nodular density  projected in the upper left hemithorax. It could be a rib or a pulmonary  nodule.  They recommended CT of the chest to evaluate this.   Abdominal ultrasound done on March 20 showed an echogenic right hepatic  lobe mass statistically most likely a hemangioma, but this is  nonspecific.  A triple phase CT scan of the abdomen with contrast  according to liver mass protocol.  A liver mass protocol MRI with  contrast is recommended for definitive characterization.  All other  laboratory studies were  within normal limits.  Legrand Pitts Duffy, P.A.      John L. Rendall, M.D.  Electronically Signed    KED/MEDQ  D:  04/04/2008  T:  04/04/2008  Job:  [id number]

## 2011-05-14 NOTE — Consult Note (Signed)
NAME:  Dean Fry, Dean Fry           ACCOUNT NO.:  [id number]   MEDICAL RECORD NO.:  [id number]          PATIENT TYPE:  INP   LOCATION:  1236                         FACILITY:  [facility]   PHYSICIAN:  Casimiro Needle L. Reynolds, M.D.DATE OF BIRTH:  06/01/1946   DATE OF CONSULTATION:  03/31/2008  DATE OF DISCHARGE:                                 CONSULTATION   REQUESTING PHYSICIAN:  Dr. Olena Leatherwood.   REASON FOR EVALUATION:  Altered mental status and stroke.   HISTORY OF PRESENT ILLNESS:  This is the initial inpatient consultation  visit for this 65 year old man who was brought to [facility]  last night with alteration in mental status.  The patient really is not  able to tell me very much about this, other than he recalls being with  the police and the EMS under their control.  He has a friend at the  bedside, who was not available, but states that he talked to the patient  about a week ago and he seemed normal.  Apparently the patient was  brought in by another friend to the ED for confused and aggressive  behavior, and the plan was actually to have him committed to behavioral  health.  However, he had a CT of the head done which raised the  possibility of an acute stroke; and, therefore, was admitted to [facility] for further management.   The patient had an MRI earlier today to clarify this further, which is  available for review.  The friend at the bedside does agree that the  patient does not seem to be himself.  He seems easily confused and  tangential, and fairly loquacious.  The patient denies chronic use of  medications.  He has been prescribed antihypertensives in the past, but  says that he subsequently discontinued them.  He recently had surgery on  his right knee, and says that since that time he has been taking a  variety of medications.  Neurologic consultation is requested for  further considerations.   PAST HISTORY:  Remarkable for hypertension as above.   Beyond that he  denies any chronic medical problems; specifically diabetes, stroke, or  coronary disease.  He says that he had heart surgery as a teenager for  correction of an atrial septal defect.  He also states that he has had  some head injuries in the remote past.   FAMILY HISTORY/SOCIAL HISTORY/REVIEW OF SYSTEMS:  Per admission H&P of  March 30, 2008 with Dr. Toniann Fail which is reviewed.   MEDICATIONS:  None prior to admission.  He is presently on intravenous  fluids, IV Protonix, and Ativan p.r.n..   PHYSICAL EXAMINATION:  VITAL SIGNS:  Afebrile, blood pressure was  139/59, pulse 76, respirations 16, GENERAL EXAMINATION:  This is a  healthy-appearing man lying supine on a hospital bed in no distress.  HEAD:  Cranium is normocephalic, atraumatic.  Oropharynx benign.  NECK:  Supple without carotid or supraclavicular bruits.  HEART:  Regular rhythm without murmurs.   NEUROLOGIC EXAMINATION:  Mental status--he is awake and alert.  He is  oriented to time and place, but not  really the situation.  He is able to  register 3/3 memory objects, and recall 2 of them after distraction.  He  performs concentration tasks without much difficulty.  He is able to  name objects and repeat phrases.  In his speech, he does have occasional  paraphasic errors, and he does seem to be a little bit tangential at  times in his speech, often frequently jumping from one topic to another.  He also is a little bit expansive in his mood; however, his interactions  with me generally are fully appropriate.   CRANIAL NERVES:  Pupils equal and briskly reactive.  Extraocular  movements are full with some gaze-evoked nystagmus to the left.  Visual  fields are full to confrontation.  Hearing is intact to conversational  speech.  Facial sensation was intact to pinprick.  Face, tongue and  palate moved normally and symmetrically.   MOTOR:  Normal bulk and tone normal strength of all tested muscles.  Sensation  intact to light touch and double stimulation in all  extremities.   COORDINATION:  Finger-to-nose reveals a bit of left-sided ataxia,  otherwise normal on the right..  Gait is deferred.  Reflexes are 2+ and  symmetric.  Toes are downgoing bilaterally.   LABORATORY REVIEW:  CBC from this morning:  White count 10.2, hemoglobin  10.3, platelets 377,000.  A CMET from late last night, normal except for  slightly elevated glucose of 106.  Hemoglobin A1c this morning normal at  5.2.  Cardiac enzymes have shown a high total CK with a negative MB and  troponin.  Lipid profile is normal except for a little bit of a low HDL.  Homocysteine level is normal.   A 2-D echocardiogram performed today is pending.   MRI of the brain performed earlier today is personally reviewed.  There  is no diffusion positive abnormality.  Would agree that there is some  cephalomalacia and perhaps some calcification, much less likely  hemorrhage, involving the left inferior cerebellar territory.   IMPRESSION:  1. 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      This appears be chronic.  A question of subacute bleeding has been      raised, although this is more likely a calcification.  He has some      old sequelae of this, including nystagmus on left gaze, and mild      left ataxia; but this is a poor explanation for his major problem.  2. Altered mental status.  He seems to be a little bit      encephalopathic, but I wonder if he does not also have a thought      disorder.  We need to rule out medical situations such as postictal      state, infection, medications, etc.  3. History of hypertension.   PLAN: We will recheck a CT of the head in the morning.  Followup carotid  Dopplers and echocardiogram.  We will check labs for altered mental  status including TSH, RPR, B12, sedimentation rate, and also check an  EEG.  It may be useful, at some point, to have a psychiatrist evaluate   him.      Michael L. Thad Ranger, M.D.  Electronically Signed     MLR/MEDQ  D:  03/31/2008  T:  04/01/2008  Job:  [id number]

## 2011-05-14 NOTE — Procedures (Signed)
This is a 65 year old man. The only history available to me is that he  was admitted with cerebellar infarctions/psychosis.  The patient  brought from behavioral health with altered mental status, questions  stroke.  The medications listed include Protonix, Cardene, Zofran,  Ativan, and atenolol. Clinically the patient was described as being  awake and drowsy.   This is a portable 17 channel EEG with one channel devoted to EKG  utilizing [employer] 10/20 lead placement system.  The patient was  described clinically as being awake and drowsy. Electrographically  appeared to be awake, drowsy and asleep.  The background consisted of an  admixture of fairly low amplitude 10 Hz alpha and 13 and higher Hertz  beta activity while awake with some muscle and motion artifact. With  drowsiness there is attenuation in the background with decrease in the  frequency and amplitude. During sleep K complexes and sleep spindles  were seen.  No interhemispheric asymmetry is identified and no definite  epileptiform discharges were seen.  No focal abnormalities were  identified.  No activation procedures were performed.  The EKG monitor  reveals a relatively regular rhythm with a rate between 54 and 60 beats  per minute.   CONCLUSION:  Essentially normal awake sleep and drowsy EEG with the  exception of excessive beta activity which may be medication related  particularly to the Ativan.  No seizure activity or focal abnormalities  were seen during the course of today's recording.  Clinical correlation  is recommended.      Catherine A. Orlin Hilding, M.D.  Electronically Signed     UXL:KGMW  D:  04/04/2008 14:09:32  T:  04/04/2008 14:28:37  Job #:  [id number]

## 2011-05-14 NOTE — Consult Note (Signed)
NAME:  Dean Fry, PETROPOULOS NO.:  [id number]   MEDICAL RECORD NO.:  [id number]          PATIENT TYPE:  INP   LOCATION:  1501                         FACILITY:  [facility]   PHYSICIAN:  Anselm Jungling, MD  DATE OF BIRTH:  Aug 12, 1946   DATE OF CONSULTATION:  04/04/2008  DATE OF DISCHARGE:                                 CONSULTATION   IDENTIFYING DATA AND REASON FOR REFERRAL:  The patient is a 65 year old,  unmarried male, currently under the care of Dr. Jilda Panda here at [facility].  He was admitted due to altered mental status, and rule  out CVA.  Psychiatric consultation is requested to assess mental status  and make recommendations.   HISTORY OF PRESENTING PROBLEM:  The presenting problem.  The patient has  no psychiatric history of that we know of.  He denies any psychiatric  history, but is not necessarily a reliable historian due to apparent  acute mental status changes.   He was initially brought to the emergency department by a friend due to  mental status changes that included confusion and aggressive behaviors.  Initially, there was a plan to admit him to the behavioral health  center, but upon medical clearance, a CT scan of the head suggested the  possibility of acute CVA.  Because of this, he was hospitalized  medically here at [facility] rather than being admitted directly to  inpatient psychiatry.   Since his admission, he has continued with mental status changes,  according to his friends who know him well and who have continued to  visit and communicate with the treatment staff.  The patient has been  somewhat tangential and loquacious.   Neurological consultation has been obtained.  The neurologist has  reviewed CT and MRI scans, which show chronic left posterior inferior  cerebellar artery territory stroke, but no acute changes.  The  neurologist consultant felt that the patient was perhaps a bit  encephalopathic, but also raise the  possibility of a thought disorder.  Additional studies have been obtained.  A B12 is within normal range.  ESR is significantly elevated at 60.  An EEG is pending.  RPR is  negative.  TSH is within normal limits.  A urine drug screen is  negative.   In terms of medical history, the patient had major surgery on his right  knee on March 11, 2008, and since then has been on a regimen of  medications including Arixtra, OxyContin, Celebrex, Percocet, and  Lyrica.   MENTAL STATUS AND OBSERVATIONS:  The patient is a well-nourished,  normally-developed adult male who was initially sleeping in bed when I  walked in, but he woke up quickly, and began speaking a loud and fairly  rapid voice.  He indicated right away that he needed to orient himself,  and he immediately inspected the wall opposite his bed, where there is a  calendar, and a clock, and he verbally noted the date and time.  Once he  put on his glasses, he looked at me and asked me carefully who I was,  saying, if he could identify yourself.  It is difficult to determine his level of orientation because he used  the cues available in the room to tell him what day it is.  When I asked  him how and why he came to be in the hospital, he gave a somewhat  confused and tangential explanation having to do in getting in the back  of somebody's car, who that person was he was not sure, but he did not  think it was police.  He went on in a rapid and tangential fashion for  quite some time, and as he is doing so, it is clear that he is trying as  hard as possible to piece together bits and pieces of his memories, and  current experience, to try to form some kind of kind of cohesive hold.  Interspersed with this was his comments that he wants to get out so that  he can watch the finals of the basketball tournament tonight on  television.   Through all of this, he is generally pleasant and fairly friendly, with  occasional humor.  He was also  taking notes during some of her  conversation, and he has several pieces of paper that have been written  all over by him, in a disorganized fashion.  He repeatedly suggested  that I take notes on what he was telling me.   He did give me permission to speak with his friends, in fact, encouraged  me to.  He did understand that I was a psychiatrist.  He was able to  acknowledge that he has experienced some confusion, and he is not sure  why.  He denies any psychiatric history.   He tells me that he lives alone, and was working at [employer] part-  time, just prior to his knee surgery.  He states that he has no family  in the immediate geographic area, but he does have several friends.   IMPRESSION:  The patient presents a unclear picture of acute mental  status changes, associated with brain imaging studies that showed no  acute changes, but some CVA in the distant past.  At present, he shows  evidence of moderate confusional state, but we can also not rule out the  possibility of hypomania or mania.  It is important to note that he did  not make any delusional statements, and does not appear to be responding  to internal stimuli, therefore he does not really have an appearance of  someone who is psychotic.  However, some of his ideation had some  qualities of magical thinking, for example, he is telling me that when  his friends visit they stay in a certain seat within the hospital room,  and that seat designates them as friends.   DIAGNOSTIC IMPRESSION:  AXIS I:  Rule out psychosis NOS, rule out acute  confusions, delirium secondary to medical condition.  Rule out bipolar disorder, manic, rule out toxic psychosis.  AXIS II:  Deferred.  AXIS III:  Status post knee surgery, history of CVA.  AXIS IV:  Stressors severe.  AXIS V:  GAF 35.   RECOMMENDATIONS:  At this time, it is not clear the etiology of the  patient's acute mental status changes, and it is not clear what the most   appropriate diagnosis for him is.  However, I feel that when he is  medically cleared, and/or his medical workup is complete here, it would  be appropriate to have him transferred to the inpatient psychiatric  service, as was,  after all the initial plan.   I think it could be very helpful of social services could speak to his  friends, and obtain more detailed history about this individual's  current, recent, and past life, to what extent they can provide such  information.   At present, the patient is currently on Zyprexa 5 mg daily.  I feel this  could be increased to 10 mg daily safely, as the patient is clearly not  sedated, and this could address underlying issues of either psychosis,  mania, or confusional and delirious states.   I will follow this patient through his stay.  Thank you for involving me  in his care.      Anselm Jungling, MD  Electronically Signed     SPB/MEDQ  D:  04/04/2008  T:  04/04/2008  Job:  [id number]

## 2011-05-14 NOTE — Discharge Summary (Signed)
NAME:  Dean Fry, VEASEY NO.:  [id number]   MEDICAL RECORD NO.:  [id number]          PATIENT TYPE:  IPS   LOCATION:  0401                          FACILITY:  [facility]   PHYSICIAN:  Jasmine Pang, M.D. DATE OF BIRTH:  1946/03/17   DATE OF ADMISSION:  04/05/2008  DATE OF DISCHARGE:  04/13/2008                               DISCHARGE SUMMARY   IDENTIFICATION:  This is a 65 year old man from [place].   HISTORY OF PRESENT ILLNESS:  The patient was admitted due to confusion  and combativeness.  Dr. Electa Sniff who consulted on the patient and found  evidence of moderate confusional state.  He suggested the patient be  admitted to rule out psychosis NOS, rule out acute confusion or delirium  secondary to medical conditions, bipolar disorder, manic, or toxic  psychosis.  The patient had a recent total knee replacement in mid  March.  He lacked the capacity for medical decision making.   PAST PSYCHIATRIC HISTORY:  This is the first [facility] admission for the  patient.  He has no prior history.   FAMILY HISTORY:  Family history is unknown.   ALCOHOL AND DRUG HISTORY:  There is no apparent alcohol or drug use.   MEDICAL PROBLEMS:  Right knee replacement on March 19th.   DRUG ALLERGIES:  No known drug allergies.   PHYSICAL FINDINGS:  There are no acute physical or medical problems  noted.   ADMISSION LABORATORIES:  Admission laboratories were done during his  hospital stay at [facility].   HOSPITAL COURSE:  Upon admission, the patient was continued on his home  medications of the atenolol 50 mg p.o. daily, aspirin 81 mg daily,  Zyprexa 10 mg p.o. q.h.s.  On April 05, 2008, he was started on Geodon  IM, may repeat times one after 2 hours for severe agitation and Ativan 2  mg IM or p.o. q.6 hours for p.r.n. irritability, agitation.  He was also  started on ibuprofen 800 mg p.o. q.8 h p.r.n. pain.  On April 06, 2008,  he was started on Geodon 80 mg p.o. b.i.d.  In individual  sessions with  me, the patient was initially irritable and anxious.  He had difficulty  going to group because of his mood lability.  His speech was fast and  pressure.  He was loud hyperverbal.  Eye contact intense.  Psychomotor  agitation, mood again was angry at times euphoric at other times.  Affect labile.  He denies suicidal or homicidal ideation.  He denied  auditory or visual hallucinations or it was positive paranoia and  delusional ideation.  Thoughts revealed flight of ideas and  circumstantiality.  Thought content, he was preoccupied with wanting to  see his orthopedic surgeon.  His hospitalization progressed he continued  to be euphoric though his irritability began to resolve.  He became less  paranoid.  He was friendly and more engaging.  He was complying with  Geodon 80 mg p.o. b.i.d. and there were no significant side effects  noted to that.  The patient still had flight of ideas with disorganized  thinking and was unable to converse  in a meaningful manner.  Mood was  expansive and irritable on April 08, 2008.  He began to refuse his  medications, but on April 11, 2008 agreed to take them.  He was started  on Depakote ER 500 mg p.o.  q.h.s. on April 08, 2008.  On April 09, 2008, the Depakote ER was increased to 500 mg in the morning and at  bedtime.  The patient continued to have some flight of ideas and  tangential ideation.  He was still focused on finding signs in his  room.  He wanted me to call his occasional rehab counselor, but could  not give me a reason.  The patient's mother had been talking with [place]  over the phone.  A friend of his was also talking with [place] during the  hospitalization.  On April 13, 2008, the patient's mother and friends  felt the patient was at baseline.  His mood was euthymic.  Affect  consistent with mood.  There was no suicidal or homicidal ideation.  No  auditory or visual hallucinations.  No paranoia or delusions.  Thoughts  were logical  and goal-directed.  Thought content no predominant theme.  Cognitive was grossly back to baseline and it was felt the patient was  close to baseline and able to go home today.  His friend and his mother  agreed that he sounded as well enough to return home.   DISCHARGE DIAGNOSES:  Axis I:  Mood disorder, not otherwise specified.  Axis II:  None.  Axis III:  Status post knee replacement, hypertension.  Axis IV:  Moderate problems with primary support group, other  psychosocial problems, burden of psychiatric illness, burden of recent  medical problems.  Axis V:  Global assessment of functioning was 47 upon discharge.  GAF  was 35 upon admission.  GAF highest past year was 65.   DISCHARGE PLAN:  There was no specific activity level or dietary  restriction.   POSTHOSPITAL CARE PLANS:  The patient will go to the [facility] for  followup on April 21, 2008, at 11:30 a.m.   DISCHARGE MEDICATIONS:  1. Tenormin 50 mg p.o. every day.  2. Aspirin 81 mg p.o. every day.  3. Zyprexa 10 mg p.o. daily.  4. Geodon 80 mg twice daily with food.  5. Depakote ER 500 mg in the morning and at bedtime.      Jasmine Pang, M.D.  Electronically Signed     BHS/MEDQ  D:  05/16/2008  T:  05/16/2008  Job:  [id number]

## 2011-05-14 NOTE — H&P (Signed)
NAME:  Dean Fry, Dean Fry NO.:  [id number]   MEDICAL RECORD NO.:  [id number]          PATIENT TYPE:  INP   LOCATION:  1236                         FACILITY:  [facility]   PHYSICIAN:  Eduard Clos, MDDATE OF BIRTH:  11/09/1946   DATE OF ADMISSION:  03/30/2008  DATE OF DISCHARGE:                              HISTORY & PHYSICAL   History is obtained from patient's friend, Mr. Alfredo Bach and the nurse at  [facility] and the ER on-call and ER physician.   CHIEF COMPLAINT:  Altered mental status.   HISTORY OF PRESENT ILLNESS:  A 65 year old male with known history of  hypertension with a recent right knee surgery was brought in to ER from  [facility] after the patient was found to have altered mental  status and was referred actually for medical clearance for involuntary  commitment to [facility].  The patient has had a recent  surgery a week ago for his right knee, subsequent to which the patient  was taking medications for pain relief and per patient's friend Mr.  Alfredo Bach.  The patient started to behave abnormally, which was not  himself, and has not behaved like this before.  The patient was  completely confused and very aggressive, and in order to commit patient  to [facility], the patient was actually brought here today to get  medical clearance for further management of his possible psychosis to be  managed in the [facility].  CT scan of the head showed a good  CVA, thus, the patient is being admitted for further management.  Per  the patient's friend, the patient usually behaves well, and has not  complained of any chest pain.  Had a stress test done in February 2009,  per recordsshowing in the computer which was negative for any ischemia.  Has not had any complaints of shortness of breath or any loss of  consciousness to his knowledge.  But since his knee surgery, the patient  had been behaving very abnormally and that  prompted him involuntarily  commit patient to the [facility].  The patient's only known  relative lives in [place] which is an 66 year old male who actually  had taken care of him when he was young, and there was no other relative  per Mr. Alfredo Bach who is patient's friend.   PAST MEDICAL HISTORY:  Hypertension.   PAST SURGICAL HISTORY:  Has had two surgeries for his knee.   MEDICATIONS ON ADMISSION:  Not clearly known, but per records, shows  patient was on Arixtra, OxyContin, Celebrex, Percocet, Lyrica, Robaxin.   ALLERGIES:  No known drug allergies.   SOCIAL HISTORY:  The patient lives in a retired home.  Used to work in  [employer].  Used to be a Mudlogger.  Per the patient's friend, the  patient does not smoke cigarettes, drink alcohol or use any illegal  drugs.   REVIEW OF SYSTEMS:  Not possible, but the patient is very drowsy and  does not give a good history.   PHYSICAL EXAMINATION:  GENERAL:  The patient was examined at bedside,  not in acute  distress.  VITAL SIGNS:  Blood pressure 160/80, pulse 84 per minute, temperature  97.9, respirations 18, O2 saturation 98%.  HEENT:  Anicteric.  PERRLA.  Positive pallor.  CHEST:  Clear bilaterally to auscultation.  No rhonchi.  No crepitation.  HEART:  S1, S2 are heard.  ABDOMEN:  Soft, nontender, bowel sounds are heard.  CNS:  The patient is drowsy, arousable, follows commands, but does not  answer questions more than yes or no.  Most upper and lower extremities  5/5.  Tongue is in the middle.  There is no facial drooping.  EXTREMITIES:  Peripheral pulses felt.  No edema.  There are sutures seen  on the right knee with mild swelling.  No discharge is seen.   LABORATORY DATA:  CBC:  WBC 10.5, hemoglobin 10.6, hematocrit 30.6,  platelets 415, neutrophils 75%.  CMP:  Sodium 136, potassium 3.6,  chloride 102, CO2 25, glucose 106, BUN 39, creatinine 1.2, AST 32, ALT  24, albumin 3.6, calcium 9.2.  Drug screen  negative. Chloride 102, CO2  25, glucose 106, BUN 39, creatinine 1.2.  CAT scan of the head:  Left  posterior inferior cerebral hypodensity most compatible with infarct.  There is a small central amount of increased density suspicious for mild  hemorrhage.  I have discussed this finding with the radiologist.   ASSESSMENT:  1. 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  2. Uncontrolled hypertension.  3. Normocytic-normochromic anemia.  4. Recent right knee surgery.  5. Altered mental status, probably significant to CVA and medications.   PLAN:  Admit patient to Team C to ICU.  Will place patient on neuro  checks.  Will obtain MRI/MRA, carotid ultrasound and 2D echo.  Check  fasting lipid panels.  The patient will not be on any anticoagulants  secondary to his hemorrhage  and due to his hemorrhage will try to  maintain blood pressure with Cardene drip.  I discussed with  neurologist, Dr. Orlin Hilding.      Eduard Clos, MD  Electronically Signed     ANK/MEDQ  D:  03/31/2008  T:  03/31/2008  Job:  [id number]

## 2011-05-14 NOTE — Discharge Summary (Signed)
Dean Fry, Dean Fry           ACCOUNT NO.:  [id number]   MEDICAL RECORD NO.:  [id number]          PATIENT TYPE:  INP   LOCATION:  1501                         FACILITY:  [facility]   PHYSICIAN:  Ladell Pier, M.D.   DATE OF BIRTH:  03/01/1946   DATE OF ADMISSION:  03/30/2008  DATE OF DISCHARGE:  04/04/2008                               DISCHARGE SUMMARY   DISCHARGE DIAGNOSES:  1. Mania  2. Hypertension.  3. Blood loss anemia.  4. Knee surgery status post knee surgery on April 29.  5. Chronic inferior left cerebellar infarct seen on CAT scan April 01, 2008.  6. Parotid cyst, most likely benign.  Follow-up CT scan to re-evaluate      the cyst in one year.   DISCHARGE MEDICATIONS:  1. Atenolol 50 mg daily.  2. Zyprexa 10 mg daily.  3. Aspirin 81 mg daily.   FOLLOW-UP APPOINTMENTS:  The patient is being discharged to [facility].   PROCEDURES:  Initially, he had a CT scan of the head done that showed  left posterior inferior cerebellar hypodensity, most compatible with  infarct, with a small density suspicious for mild associate hemorrhage.  Chest x-ray showed old granulomatous disease and mild cardiomegaly.  An  MRI of the brain showed chronic-appearing abnormality of the inferior  left cerebellar hemisphere, favor prior infarct with petechial  hemorrhage or dystrophic calcification.  Less likely, this could reflect  a subacute infarct or less likely still a parenchymal hemorrhage.  Repeat CT scan of the head showed no acute intracranial abnormality,  consisting of findings most suggestive of chronic anterior left  cerebellar infarct and dystrophic calcification, otherwise normal for  age.  CT scan of the neck showed  9 x 5-mm lesion of the deep lobe of  the left parotid gland, most likely cysts, follow up in one year.   CONSULTANTS:  Neurology and psychiatry.   HISTORY OF PRESENT ILLNESS:  The patient is a 65 year old white male  that recently underwent knee  surgery here at [facility]  Recently, he  has been having some change in his mental status.  He was transferred to  the Behavioral health and was sent to the ER for clearance, but  abnormality was seen on the CAT scan.  She was admitted for further  workup.  Per his friends, the patient started behaving abnormal, he was  not himself and has never had that kind of behavior before.  The patient  was confused, very aggressive.   PAST MEDICAL HISTORY:  Family, social history, meds, allergies and  review of systems, per admission H&P.   PHYSICAL EXAMINATION ON DISCHARGE:  VITAL SIGNS:  Temperature 98.4,  pulse 58, respirations 16, blood pressure 107/50, pulse ox 98% on room  air.  GENERAL:  The patient was walking around the hospital.  No acute  distress.  HEENT:  Head is normocephalic, atraumatic.  Pupils reactive to light.  Throat without edema.  HEART:  Was regular rate and rhythm.  LUNGS:  Clear bilaterally.  No wheezes, rhonchi or rales.  ABDOMEN:  Soft, nontender, nondistended.  Positive  bowel sounds.  EXTREMITIES:  May be a trace edema on the right knee, 2+ DP pulse  bilaterally.   HOSPITAL COURSE:  1. Mental status changes:  The patient was admitted to the hospital,      had several CTs an MRIs done to rule out an acute stroke.  The last      CT scan showed a previous cerebellar stroke, but that is not acute.      No further interventional workup noted, per neurology.  The patient      will be discharged to the [facility].  His blood pressure is      controlled.  2. Knee surgery:  The patient to continue physical therapy, per ortho      recommendations.  3. Anemia:  This is most likely blood loss anemia, is guaiac negative.  4. Hypertension:  The patient's blood pressure is controlled on his      current medication regimen.  5. Parotid cyst:  The patient to have repeat CT scan of the cyst in 1      year, most likely is benign.   DISCHARGE LABS:  WBC 8.7, hemoglobin  11, platelets 362, guaiac negative.  Vitamin B12 489, TSH 2.56.  RPR nonreactive.  ESR sodium 134, potassium  3.9, chloride 100, CO2 26, glucose 96, BUN 21, creatinine 1.09,  hemoglobin A1c 5.2, troponin 0.01, CK 455, MB 8.7, relative index 1.9,  UDS negative.  A 2-D echo showed normal EF at 65%, with some increase in  left ventricle wall thickness.      Ladell Pier, M.D.  Electronically Signed     NJ/MEDQ  D:  04/04/2008  T:  04/04/2008  Job:  [id number]   cc:   Anselm Jungling, MD   Carlisle Beers. Rendall, M.D.  Fax: [phone]

## 2011-08-29 ENCOUNTER — Other Ambulatory Visit: Payer: Self-pay | Admitting: Internal Medicine

## 2011-08-29 ENCOUNTER — Ambulatory Visit
Admission: RE | Admit: 2011-08-29 | Discharge: 2011-08-29 | Disposition: A | Discharge status: Home / Self Care | Payer: Medicare Other | Source: Ambulatory Visit | Attending: Internal Medicine | Admitting: Internal Medicine

## 2011-08-29 DIAGNOSIS — M1990 Unspecified osteoarthritis, unspecified site: Secondary | ICD-10-CM

## 2011-09-23 LAB — BASIC METABOLIC PANEL
BUN: 12
BUN: 15
CO2: 30
Calcium: 7.8 — ABNORMAL LOW
Calcium: 7.9 — ABNORMAL LOW
GFR calc non Af Amer: >60
GFR calc non Af Amer: >60
Glucose, Bld: 103 — ABNORMAL HIGH
Glucose, Bld: 107 — ABNORMAL HIGH
Potassium: 3.7
Sodium: 135

## 2011-09-23 LAB — CBC
HCT: 25.1 — ABNORMAL LOW
HCT: 29.8 — ABNORMAL LOW
Hemoglobin: 10.3 — ABNORMAL LOW
Hemoglobin: 8.6 — ABNORMAL LOW
MCHC: 35.4
MCHC: 34.7
MCV: 85.7
Platelets: 217
Platelets: 144 — ABNORMAL LOW
Platelets: 167
RBC: 2.88 — ABNORMAL LOW
RDW: 13.8
RDW: 13.9
RDW: 14
WBC: 6.4
WBC: 7.6
WBC: 9.9

## 2011-09-23 LAB — URINE CULTURE
Culture: NO GROWTH
Special Requests: NEGATIVE

## 2011-09-23 LAB — URINALYSIS, ROUTINE W REFLEX MICROSCOPIC
Bilirubin Urine: NEGATIVE
Hgb urine dipstick: NEGATIVE
Ketones, ur: NEGATIVE
Nitrite: NEGATIVE
pH: 5.5

## 2011-09-23 LAB — CROSSMATCH
Antibody Screen: POSITIVE
Donor AG Type: NEGATIVE
PT AG Type: NEGATIVE

## 2011-09-23 LAB — COMPREHENSIVE METABOLIC PANEL
AST: 23
Albumin: 3.6
Calcium: 8.7
Creatinine, Ser: 0.89
GFR calc Af Amer: >60
Total Protein: 6.4

## 2011-09-23 LAB — DIFFERENTIAL
Eosinophils Relative: 2
Lymphocytes Relative: 25
Lymphs Abs: 1.6
Monocytes Absolute: 0.3

## 2011-09-23 LAB — PROTIME-INR: Prothrombin Time: 13.3

## 2011-09-24 LAB — CBC
HCT: 26.8 — ABNORMAL LOW
HCT: 27 — ABNORMAL LOW
HCT: 28.1 — ABNORMAL LOW
HCT: 28.1 — ABNORMAL LOW
HCT: 28.9 — ABNORMAL LOW
HCT: 29 — ABNORMAL LOW
HCT: 29.1 — ABNORMAL LOW
HCT: 29.6 — ABNORMAL LOW
HCT: 30.4 — ABNORMAL LOW
HCT: 30.5 — ABNORMAL LOW
HCT: 30.5 — ABNORMAL LOW
HCT: 30.5 — ABNORMAL LOW
HCT: 30.6 — ABNORMAL LOW
HCT: 30.7 — ABNORMAL LOW
Hemoglobin: 10 — ABNORMAL LOW
Hemoglobin: 10.1 — ABNORMAL LOW
Hemoglobin: 10.1 — ABNORMAL LOW
Hemoglobin: 10.1 — ABNORMAL LOW
Hemoglobin: 10.3 — ABNORMAL LOW
Hemoglobin: 10.3 — ABNORMAL LOW
Hemoglobin: 10.3 — ABNORMAL LOW
Hemoglobin: 10.3 — ABNORMAL LOW
Hemoglobin: 10.4 — ABNORMAL LOW
Hemoglobin: 10.6 — ABNORMAL LOW
Hemoglobin: 10.7 — ABNORMAL LOW
Hemoglobin: 9.5 — ABNORMAL LOW
Hemoglobin: 9.5 — ABNORMAL LOW
Hemoglobin: 9.7 — ABNORMAL LOW
MCHC: 33.8
MCHC: 34.1
MCHC: 34.2
MCHC: 34.5
MCHC: 34.6
MCHC: 34.9
MCHC: 35
MCHC: 35
MCHC: 35.3
MCHC: 35.3
MCHC: 35.3
MCV: 86
MCV: 86.4
MCV: 86.5
MCV: 86.6
MCV: 86.7
MCV: 86.8
MCV: 87.4
MCV: 87.8
MCV: 87.8
MCV: 87.9
MCV: 88
MCV: 88.4
Platelets: 312
Platelets: 315
Platelets: 315
Platelets: 320
Platelets: 326
Platelets: 331
Platelets: 342
Platelets: 354
Platelets: 362
Platelets: 369
Platelets: 376
Platelets: 377
Platelets: 388
Platelets: 423 — ABNORMAL HIGH
RBC: 3.12 — ABNORMAL LOW
RBC: 3.12 — ABNORMAL LOW
RBC: 3.22 — ABNORMAL LOW
RBC: 3.33 — ABNORMAL LOW
RBC: 3.34 — ABNORMAL LOW
RBC: 3.42 — ABNORMAL LOW
RBC: 3.46 — ABNORMAL LOW
RBC: 3.51 — ABNORMAL LOW
RBC: 3.52 — ABNORMAL LOW
RBC: 3.56 — ABNORMAL LOW
RDW: 13.1
RDW: 13.2
RDW: 13.2
RDW: 13.3
RDW: 13.4
RDW: 13.4
RDW: 13.5
RDW: 13.6
RDW: 13.8
WBC: 7.6
WBC: 7.6
WBC: 7.7
WBC: 8.1
WBC: 8.1
WBC: 8.4
WBC: 8.6
WBC: 8.7
WBC: 9.4

## 2011-09-24 LAB — CARDIAC PANEL(CRET KIN+CKTOT+MB+TROPI)
CK, MB: 5.1 — ABNORMAL HIGH
Relative Index: 1.7
Relative Index: 1.8
Troponin I: 0.01

## 2011-09-24 LAB — DIFFERENTIAL
Basophils Absolute: 0
Basophils Absolute: 0.1
Eosinophils Relative: 1
Eosinophils Relative: 1
Lymphocytes Relative: 17
Lymphocytes Relative: 17
Lymphs Abs: 1.8
Monocytes Absolute: 0.4
Monocytes Absolute: 0.6
Monocytes Relative: 6
Neutro Abs: 7.9 — ABNORMAL HIGH

## 2011-09-24 LAB — COMPREHENSIVE METABOLIC PANEL
AST: 32
Albumin: 3.6
BUN: 39 — ABNORMAL HIGH
Chloride: 102
Creatinine, Ser: 1.21
GFR calc Af Amer: >60
Total Bilirubin: 1
Total Protein: 7.2

## 2011-09-24 LAB — LIPID PANEL
Cholesterol: 117
HDL: 24 — ABNORMAL LOW
LDL Cholesterol: 73
Triglycerides: 98

## 2011-09-24 LAB — TROPONIN I: Troponin I: 0.01

## 2011-09-24 LAB — BASIC METABOLIC PANEL
CO2: 26
Chloride: 100
GFR calc non Af Amer: >60
Glucose, Bld: 96
Potassium: 3.9
Sodium: 134 — ABNORMAL LOW

## 2011-09-24 LAB — URINALYSIS, ROUTINE W REFLEX MICROSCOPIC
Bilirubin Urine: NEGATIVE
Ketones, ur: NEGATIVE
Nitrite: NEGATIVE
Protein, ur: NEGATIVE
pH: 5.5

## 2011-09-24 LAB — HOMOCYSTEINE: Homocysteine: 14.5

## 2011-09-24 LAB — URINE MICROSCOPIC-ADD ON

## 2011-09-24 LAB — TSH: TSH: 2.56

## 2011-09-24 LAB — RAPID URINE DRUG SCREEN, HOSP PERFORMED
Amphetamines: NOT DETECTED
Benzodiazepines: NOT DETECTED
Cocaine: NOT DETECTED
Opiates: NOT DETECTED
Tetrahydrocannabinol: NOT DETECTED

## 2011-09-24 LAB — CK TOTAL AND CKMB (NOT AT ARMC): Relative Index: 1.9

## 2011-09-24 LAB — RPR: RPR Ser Ql: NONREACTIVE

## 2012-10-23 ENCOUNTER — Encounter (HOSPITAL_COMMUNITY): Payer: Self-pay | Admitting: Pharmacy Technician

## 2012-10-26 ENCOUNTER — Other Ambulatory Visit (HOSPITAL_COMMUNITY): Payer: Medicare Other

## 2012-10-26 ENCOUNTER — Other Ambulatory Visit: Payer: Self-pay | Admitting: Physician Assistant

## 2012-11-02 ENCOUNTER — Encounter (HOSPITAL_COMMUNITY)
Admission: RE | Admit: 2012-11-02 | Discharge: 2012-11-02 | Disposition: A | Discharge status: Home / Self Care | Payer: Medicare Other | Source: Ambulatory Visit | Attending: Anesthesiology | Admitting: Anesthesiology

## 2012-11-02 ENCOUNTER — Encounter (HOSPITAL_COMMUNITY)
Admission: RE | Admit: 2012-11-02 | Discharge: 2012-11-02 | Disposition: A | Discharge status: Home / Self Care | Payer: Medicare Other | Source: Ambulatory Visit | Attending: Orthopedic Surgery | Admitting: Orthopedic Surgery

## 2012-11-02 ENCOUNTER — Encounter (HOSPITAL_COMMUNITY): Payer: Self-pay

## 2012-11-02 ENCOUNTER — Ambulatory Visit (HOSPITAL_COMMUNITY): Admission: RE | Admit: 2012-11-02 | Payer: Medicare Other | Source: Ambulatory Visit

## 2012-11-02 DIAGNOSIS — M1710 Unilateral primary osteoarthritis, unspecified knee: Secondary | ICD-10-CM | POA: Insufficient documentation

## 2012-11-02 DIAGNOSIS — I10 Essential (primary) hypertension: Secondary | ICD-10-CM | POA: Insufficient documentation

## 2012-11-02 DIAGNOSIS — Z01812 Encounter for preprocedural laboratory examination: Secondary | ICD-10-CM | POA: Insufficient documentation

## 2012-11-02 DIAGNOSIS — Z01810 Encounter for preprocedural cardiovascular examination: Secondary | ICD-10-CM | POA: Insufficient documentation

## 2012-11-02 DIAGNOSIS — Z01818 Encounter for other preprocedural examination: Secondary | ICD-10-CM | POA: Insufficient documentation

## 2012-11-02 HISTORY — DX: Rheumatic fever without heart involvement: I00

## 2012-11-02 HISTORY — DX: Low back pain, unspecified: M54.50

## 2012-11-02 HISTORY — DX: Atrial septal defect: Q21.1

## 2012-11-02 HISTORY — DX: Unspecified cataract: H26.9

## 2012-11-02 HISTORY — DX: Atrial septal defect, unspecified: Q21.10

## 2012-11-02 HISTORY — DX: Low back pain: M54.5

## 2012-11-02 HISTORY — DX: Pain in unspecified shoulder: M25.519

## 2012-11-02 LAB — PROTIME-INR: Prothrombin Time: 13.3 seconds (ref 11.6–15.2)

## 2012-11-02 LAB — URINALYSIS, ROUTINE W REFLEX MICROSCOPIC
Ketones, ur: NEGATIVE mg/dL
Leukocytes, UA: NEGATIVE
Nitrite: NEGATIVE
Protein, ur: NEGATIVE mg/dL
Urobilinogen, UA: 0.2 mg/dL (ref 0.0–1.0)

## 2012-11-02 LAB — COMPREHENSIVE METABOLIC PANEL
ALT: 16 U/L (ref 0–53)
Alkaline Phosphatase: 64 U/L (ref 39–117)
BUN: 21 mg/dL (ref 6–23)
CO2: 29 mEq/L (ref 19–32)
Calcium: 9.2 mg/dL (ref 8.4–10.5)
GFR calc Af Amer: 89 mL/min — ABNORMAL LOW (ref >90)
GFR calc non Af Amer: 76 mL/min — ABNORMAL LOW (ref >90)
Glucose, Bld: 83 mg/dL (ref 70–99)
Potassium: 4.2 mEq/L (ref 3.5–5.1)
Sodium: 141 mEq/L (ref 135–145)
Total Protein: 7.1 g/dL (ref 6.0–8.3)

## 2012-11-02 LAB — URINE MICROSCOPIC-ADD ON

## 2012-11-02 LAB — CBC WITH DIFFERENTIAL/PLATELET
Eosinophils Absolute: 0.1 10*3/uL (ref 0.0–0.7)
Eosinophils Relative: 2 % (ref 0–5)
Hemoglobin: 13.5 g/dL (ref 13.0–17.0)
Lymphocytes Relative: 26 % (ref 12–46)
Lymphs Abs: 1.9 10*3/uL (ref 0.7–4.0)
MCH: 29.1 pg (ref 26.0–34.0)
MCV: 85.3 fL (ref 78.0–100.0)
Monocytes Relative: 7 % (ref 3–12)
RBC: 4.64 MIL/uL (ref 4.22–5.81)
WBC: 7.4 10*3/uL (ref 4.0–10.5)

## 2012-11-02 LAB — SURGICAL PCR SCREEN
MRSA, PCR: NEGATIVE
Staphylococcus aureus: NEGATIVE

## 2012-11-02 MED ORDER — CHLORHEXIDINE GLUCONATE 4 % EX LIQD: 60.0000 mL | Freq: Once | CUTANEOUS | Status: DC

## 2012-11-02 NOTE — Pre-Procedure Instructions (Signed)
20 KASSEN FINE  11/02/2012   Your procedure is scheduled on:  Friday November 06, 2012  Report to [facility] at 5:30 AM.  Call this number if you have problems the morning of surgery: [phone]   Remember:   Do not eat food or drink :After Midnight.      Take these medicines the morning of surgery with A SIP OF WATER: none   Do not wear jewelry, make-up or nail polish.  Do not wear lotions, powders, or perfumes.  Do not shave 48 hours prior to surgery. Men may shave face and neck.  Do not bring valuables to the hospital.  Contacts, dentures or bridgework may not be worn into surgery.  Leave suitcase in the car. After surgery it may be brought to your room.  For patients admitted to the hospital, checkout time is 11:00 AM the day of discharge.   Patients discharged the day of surgery will not be allowed to drive home.  Name and phone number of your driver: family / friend  Special Instructions: Shower using CHG 2 nights before surgery and the night before surgery.  If you shower the day of surgery use CHG.  Use special wash - you have one bottle of CHG for all showers.  You should use approximately 1/3 of the bottle for each shower.   Please read over the following fact sheets that you were given: Pain Booklet, Coughing and Deep Breathing, Blood Transfusion Information, Total Joint Packet, MRSA Information and Surgical Site Infection Prevention

## 2012-11-02 NOTE — Progress Notes (Addendum)
Patient to have stress test on 11/02/12, will have stress test sent for review by anesthesia. Also request EKG to be reviewed.

## 2012-11-03 ENCOUNTER — Encounter (HOSPITAL_COMMUNITY): Payer: Self-pay | Admitting: Vascular Surgery

## 2012-11-03 NOTE — Consult Note (Signed)
Anesthesia chart review: Patient is a 66 year old male scheduled for left total knee arthroplasty by Dr. Madelon Lips on 10/06/2012.  History includes hypertension, ASD status post repair in 1962, left posterior inferior cerebellar CVA (noted to be a "chronic" finding on MRI in 03/2008, rheumatic fever at age 49 years old, hypercholesterolemia, arthritis, obesity, cataracts, nonsmoker, right TKA 02/2008 with hospitalization for altered mental status in 03/2008 (acute CVA ruled out and no seizure activity noted on EEG). PCP is Dr. Dorris Fetch.  EKG on 11/02/12 showed SR with first degree AVB, LAD, incomplete right BBB.  It was not felt significantly changed from 03/31/08.  Echo on 03/31/08 showed: - Overall left ventricular systolic function was normal. Left ventricular ejection fraction was estimated to be 65 %. There were no left ventricular regional wall motion abnormalities. Left ventricular wall thickness was mildly to moderately increased. IMPRESSIONS - Although no cardiac source of embolism was identified, the possibility cannot be ruled out on the basis of this study.  Nuclear stress test on 02/02/08 showed: No perfusion defects. Septal hypokinesis. Ejection fraction 60%.  CXR on report on 11/02/12 showed the heart and pulmonary vascularity are within normal limits. Multiple calcified granulomas are seen. Postsurgical changes are noted. The lungs are clear.  Labs noted.  Urine culture is pending.  I'll follow-up stress test results when available.  Shonna Chock, PA-C 11/03/12 1307

## 2012-11-04 LAB — URINE CULTURE: Colony Count: NO GROWTH

## 2012-11-04 NOTE — Progress Notes (Signed)
Office of pts PCP Dr Renato Gails [phone]) closed at this time.  We are still waiting for the stress tests results from 11/4.  Left message with medical records to have them faxed to [place] ASAP.

## 2012-11-04 NOTE — Progress Notes (Signed)
Spoke with Dr Ernest Mallick office and they state that the pt never had a stress test done because it was ordered an exercise stress test and the pt is not able to walk so they canceled the test.

## 2012-11-05 NOTE — H&P (Signed)
[facility] [address]   [address] [place], [place] [address] [phone] A Division of The [facility]  Loreta Ave, M.D.   Robert A. Thurston Hole, M.D.   Burnell Blanks, M.D.   Eulas Post, M.D.   Lunette Stands, M.D Buford Dresser, M.D.  Charlsie Quest, M.D.   Estell Harpin, M.D.   Melina Fiddler, M.D. Genene Churn. Barry Dienes, PA-C            Kirstin A. Shepperson, PA-C Josh Hanalei, PA-C [place], [place]  RE: [place], [place]   [id number]      DOB: 12-23-1946 PROGRESS NOTE: 10-22-12 Follow-up left knee osteoarthritis.   HPI: The patient is a 66 year old male with a history of left knee osteoarthritis status post right total knee arthroplasty by Dr. Priscille Kluver in 2008. Also with history of hypertension, high cholesterol. Here to discuss left knee osteoarthritis, continued knee pain. Has failed conservative treatment with NSAIDs, corticosteroid injection, physical therapy, pain medications. The left knee pain is significantly affecting his quality of life, activities of daily living. It will awaken him from sleep at night.  Review of Systems:   A 10-point review of systems obtained and positive for glasses/contacts, high blood pressure, rheumatic fever in 1955, nosebleeds.  Past Medical History:   Hypertension, high cholesterol. Past hospitalization for food poisoning and dehydration in 2010. Past Surgical History:   Reconstructive knee surgery in 1998, arthroscopic knee surgery in 1990, right total knee arthroplasty in 2007, also bilateral plantar fascia surgeries.  Current Medications:   Lisinopril 20-25 mg daily, Lovaza 1 gm capsule 3 times a day, atorvastatin 40 mg at bedtime, Celebrex 200 mg as needed. Also takes a baby aspirin daily. No known drug allergies.   Family History:   Positive for cancer in his grandparents. He states he is adopted so does not have a detailed history.  Social History:   The patient is a  nonsmoker. He does drink alcohol approximately 3 times a month. He lives alone. He does not live in a one story residence. There are 4 floors in an apartment building. He is divorced. 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   EXAMINATION: The patient is seated in the exam room in no acute distress. Alert and oriented. Appears appropriate age. Height 5'11", weight 255 pounds. BMI calculated at 35.6. HEENT:  pupils are equal, round and reactive to light. Has no dentures or oral implants. Neck is supple with good range of motion. Chest and lungs clear to auscultation bilaterally. Normal breath sounds. Normal effort. Cardiac:  regular rate and rhythm. No sign of murmur. Abdomen:  soft, nontender, nondistended. Positive bowel sounds in all 4 quadrants. Rectal/breasts:  not indicated for surgery. Neuro:  cranial nerves grossly intact. Skin:  no sign of rashes or lesions. Musculoskeletal:  examination of left lower extremity shows patient is neurovascularly intact. He has intact dorsiflexion and plantar flexion of the ankle. No calf tenderness with palpation. He does have a moderate varus deformity. Thick varicosities in front of the knees.  Medial and lateral joint line tenderness. Positive patellofemoral crepitus. Stable ligamentous testing. Range of motion from 0 to approximately 115 degrees.   IMAGING: No images were obtained today. X-rays taken of the left knee on 09-07-12 show a severely narrowed medial compartment and patellofemoral arthrosis.   ASSESSMENT:   1.  Left knee osteoarthritis, end stage. 2.  Hypertension.  3.  High cholesterol.  PLAN: The risks and benefits and left total knee arthroplasty were discussed again today and patient wishes to proceed. He has a preadmission appointment scheduled at [facility]. Surgery tentatively scheduled for 11-06-12. He was sent for a preoperative clearance from his primary care physician who is recommending a  cardiology consult and stress test. This will need to be obtained prior to going ahead with surgery. He does feel he will have some difficulty after surgery going home so will likely need skilled nursing facility. Primary care physician is  Dr. Bufford Spikes. He has an appointment set with [facility] and a stress test scheduled for 11-02-12. He mentioned some concern of addiction or dependence status post his previous surgery on the pain medication, although states he tolerates the Percocet. The OxyContin was his major concern. Will closely monitor this postoperatively. He will be given Ancef antibiotic, Lovenox for DVT prophylaxis. If he has any questions or concerns prior to surgery he may contact the office.   Kewan Mcnease A. Sura Canul, PA-C

## 2012-11-06 ENCOUNTER — Ambulatory Visit (HOSPITAL_COMMUNITY): Admission: RE | Admit: 2012-11-06 | Payer: Medicare Other | Source: Ambulatory Visit | Admitting: Orthopedic Surgery

## 2012-11-06 SURGERY — ARTHROPLASTY, KNEE, TOTAL
Anesthesia: General | Laterality: Left

## 2012-11-17 LAB — TYPE AND SCREEN
Antibody Screen: POSITIVE
DAT, IgG: NEGATIVE
Donor AG Type: NEGATIVE
Donor AG Type: NEGATIVE
Unit division: 0

## 2013-01-07 ENCOUNTER — Other Ambulatory Visit: Payer: Self-pay | Admitting: Physician Assistant

## 2013-01-08 ENCOUNTER — Encounter (HOSPITAL_COMMUNITY): Payer: Self-pay

## 2013-01-08 ENCOUNTER — Encounter (HOSPITAL_COMMUNITY)
Admission: RE | Admit: 2013-01-08 | Discharge: 2013-01-08 | Disposition: A | Discharge status: Home / Self Care | Payer: Medicare Other | Source: Ambulatory Visit | Attending: Orthopedic Surgery | Admitting: Orthopedic Surgery

## 2013-01-08 ENCOUNTER — Encounter (HOSPITAL_COMMUNITY): Payer: Self-pay | Admitting: Pharmacy Technician

## 2013-01-08 LAB — COMPREHENSIVE METABOLIC PANEL
ALT: 21 U/L (ref 0–53)
Alkaline Phosphatase: 68 U/L (ref 39–117)
CO2: 28 mEq/L (ref 19–32)
Calcium: 9.4 mg/dL (ref 8.4–10.5)
GFR calc Af Amer: >90 mL/min (ref >90)
GFR calc non Af Amer: 84 mL/min — ABNORMAL LOW (ref >90)
Glucose, Bld: 92 mg/dL (ref 70–99)
Sodium: 139 mEq/L (ref 135–145)

## 2013-01-08 LAB — CBC WITH DIFFERENTIAL/PLATELET
Eosinophils Relative: 2 % (ref 0–5)
HCT: 39.5 % (ref 39.0–52.0)
Hemoglobin: 13.3 g/dL (ref 13.0–17.0)
Lymphocytes Relative: 30 % (ref 12–46)
Lymphs Abs: 2.3 10*3/uL (ref 0.7–4.0)
MCV: 84.8 fL (ref 78.0–100.0)
Monocytes Relative: 8 % (ref 3–12)
Platelets: 210 10*3/uL (ref 150–400)
RBC: 4.66 MIL/uL (ref 4.22–5.81)
WBC: 7.7 10*3/uL (ref 4.0–10.5)

## 2013-01-08 LAB — SURGICAL PCR SCREEN
MRSA, PCR: NEGATIVE
Staphylococcus aureus: NEGATIVE

## 2013-01-08 LAB — URINALYSIS, ROUTINE W REFLEX MICROSCOPIC
Ketones, ur: NEGATIVE mg/dL
Leukocytes, UA: NEGATIVE
Nitrite: NEGATIVE
Protein, ur: NEGATIVE mg/dL

## 2013-01-08 NOTE — Pre-Procedure Instructions (Signed)
Dean Fry  01/08/2013   Your procedure is scheduled on:  Friday, January 17th  Report to [facility] at 0815 AM.  Call this number if you have problems the morning of surgery: [phone]   Remember:   Do not eat food or drink liquids after midnight.    Take these medicines the morning of surgery with A SIP OF WATER: none   Do not wear jewelry, make-up or nail polish.  Do not wear lotions, powders, or perfumes. You may not wear deodorant.  Do not shave 48 hours prior to surgery. Men may shave face and neck.  Do not bring valuables to the hospital.  Contacts, dentures or bridgework may not be worn into surgery.  Leave suitcase in the car. After surgery it may be brought to your room.  For patients admitted to the hospital, checkout time is 11:00 AM the day of  discharge.   Patients discharged the day of surgery will not be allowed to drive  home.   Special Instructions: Shower using CHG 2 nights before surgery and the night before surgery.  If you shower the day of surgery use CHG.  Use special wash - you have one bottle of CHG for all showers.  You should use approximately 1/3 of the bottle for each shower.   Please read over the following fact sheets that you were given: Pain Booklet, Coughing and Deep Breathing, Blood Transfusion Information, MRSA Information and Surgical Site Infection Prevention

## 2013-01-08 NOTE — Progress Notes (Signed)
Primary Physician - Dr. Bufford Spikes [facility] Does not have a cardiologist, Did have stress test completed in December - cannot remember who performed the stress test but dr. Renato Gails will have results. Will request records

## 2013-01-08 NOTE — Progress Notes (Signed)
01/08/13 1016  OBSTRUCTIVE SLEEP APNEA  Do you snore loudly (loud enough to be heard through closed doors)?  0  Do you often feel tired, fatigued, or sleepy during the daytime? 0  Has anyone observed you stop breathing during your sleep? 0  Do you have, or are you being treated for high blood pressure? 1  BMI more than 35 kg/m2? 1  Age over 68 years old? 1  Neck circumference greater than 40 cm/18 inches? 0  Gender: 1  Obstructive Sleep Apnea Score 4   Score 4 or greater  Results sent to PCP

## 2013-01-11 NOTE — Consult Note (Signed)
Anesthesia chart review: Patient is a 67 year old male scheduled for left total knee arthroplasty by Dr. Madelon Lips on 01/15/13. It was initially scheduled for 11/03/2012, but was postponed to allow time for him to have a stress test (ordered by his PCP).   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 PCP is Dr. Bufford Spikes who cleared him for this procedure following a low risk stress test.   EKG on 11/02/12 showed SR with first degree AVB, LAD, incomplete right BBB. It was not felt significantly changed from 03/31/08.   Nuclear stress test on 11/25/12 (done at [facility]; Dr. Jacinto Halim) showed resting EKG with sinus rhythm with first-degree AV block, LAD, LAFB, right bundle branch block (trifascicular block).  Stress EKG was nondiagnostic for ischemia. The perfusion study demonstrated moderate diaphragmatic and chest wall attenuation artifact. There was no evidence of ischemia or scar. Dynamic gated images revealed normal wall motion and endocardial thickening. Left ventricular ejection fraction was estimated to be 80%. This represents a low risk study. Based on this study, patient can be taken up for surgery with acceptable (see below) CV morbidity and mortality.  Echo on 03/31/08 showed:  - Overall left ventricular systolic function was normal. Left ventricular ejection fraction was estimated to be 65 %. There were no left ventricular regional wall motion abnormalities. Left ventricular wall thickness was mildly to moderately increased. IMPRESSIONS: Although no cardiac source of embolism was identified, the possibility cannot be ruled out on the basis  of this study.  CXR on report on 11/02/12 showed the heart and pulmonary vascularity are within normal limits. Multiple calcified granulomas are seen. Postsurgical changes are noted. The lungs are clear.   Preoperative labs noted.  If no significant change in his status then anticipate he can proceed as planned. He'll be evaluated by his assigned anesthesiologist on the day of surgery.  Shonna Chock, PA-C 01/11/13 1338

## 2013-01-14 MED ORDER — CEFAZOLIN SODIUM-DEXTROSE 2-3 GM-% IV SOLR
2.0000 g | INTRAVENOUS | Status: AC
  Administered 2013-01-15: 2 g via INTRAVENOUS
  Filled 2013-01-14: qty 50

## 2013-01-14 NOTE — H&P (Signed)
TOTAL KNEE ADMISSION H&P  Patient is being admitted for left total knee arthroplasty.  Subjective:  Chief Complaint:left knee pain.  HPI: Dean Fry, 67 y.o. male, has a history of pain and functional disability in the left knee due to arthritis and has failed non-surgical conservative treatments for greater than 12 weeks to includeNSAID's and/or analgesics, corticosteriod injections, viscosupplementation injections, supervised PT with diminished ADL's post treatment and activity modification.  Onset of symptoms was gradual, starting years ago with gradually worsening course since that time. The patient noted no past surgery on the left knee(s).  Patient currently rates pain in the left knee(s) as moderate to severe with activity. Patient has night pain, worsening of pain with activity and weight bearing, pain that interferes with activities of daily living and crepitus.  Patient has evidence of periarticular osteophytes and joint space narrowing by imaging studies. There is no active infection.  There are no active problems to display for this patient.  Past Medical History  Diagnosis Date  . Impaired vision     glasses  . Hypercholesteremia   . Stroke 03/14/2008    pt states that he may have had a mild stroke 1 week after knee surgery  . Arthritis     left knee  . Impaired hearing   . Atrial septal defect     corrected in 1962  . Rheumatic fever     at 67 years old  . Low back pain   . Shoulder pain     left, sees chiropracter for   . Cataracts, bilateral     "early stages"  . Hypertension     Sees Dr. Tiffany Reid    Past Surgical History  Procedure Date  . Asd repair   . Total knee arthroplasty     right knee  . Knee surgery     4 surgeries on right knee  . Appendectomy   . Plantar fascititis     bilateral surgery   . Asd repair   . Tonsillectomy     and removal of adnoids     (Not in a hospital admission) No Known Allergies  History  Substance Use  Topics  . Smoking status: Never Smoker   . Smokeless tobacco: Not on file  . Alcohol Use: 1.0 oz/week    2 drink(s) per week     Comment: occasional use    Family History  Problem Relation Age of Onset  . Adopted: Yes     Review of Systems  Constitutional: Negative.   HENT: Positive for hearing loss and nosebleeds. Negative for ear pain, sore throat and tinnitus.   Eyes: Negative for blurred vision, double vision, photophobia and pain.  Respiratory: Negative.   Cardiovascular: Negative.   Gastrointestinal: Negative.   Genitourinary: Negative.   Musculoskeletal: Positive for joint pain. Negative for falls.  Skin: Negative.   Neurological: Negative.  Negative for headaches.  Endo/Heme/Allergies: Negative.   Psychiatric/Behavioral: Negative.     Objective:  Physical Exam  Constitutional: He is oriented to person, place, and time. He appears well-developed and well-nourished. No distress.  HENT:  Head: Normocephalic and atraumatic.  Nose: Nose normal.  Eyes: Conjunctivae normal and EOM are normal. Pupils are equal, round, and reactive to light.  Neck: Normal range of motion. Neck supple.  Cardiovascular: Normal rate, regular rhythm, normal heart sounds and intact distal pulses.   No murmur heard. Respiratory: Effort normal and breath sounds normal. No respiratory distress. He has no wheezes. He exhibits no   tenderness.  GI: Soft. Bowel sounds are normal. He exhibits no distension. There is no tenderness.  Musculoskeletal:       Left knee: He exhibits bony tenderness. tenderness found. Medial joint line and lateral joint line tenderness noted.       Varus deformity knee, large varicosities anterior knee, intact DF and PF ankle, no calf tenderness, stable ligamentous testing knee  Neurological: He is alert and oriented to person, place, and time. No cranial nerve deficit.  Skin: Skin is warm and dry. No rash noted. No erythema. No pallor.  Psychiatric: He has a normal mood and  affect. His behavior is normal.    Vital signs in last 24 hours: @VSRANGES@  Labs:   Estimated Body mass index is 35.86 kg/(m^2) as calculated from the following:   Height as of 11/02/12: 5' 11"(1.803 m).   Weight as of 11/02/12: 257 lb 1.6 oz(116.62 kg).   Imaging Review Plain radiographs demonstrate severe degenerative joint disease of the left knee(s). The overall alignment issignificant varus. The bone quality appears to be good for age and reported activity level.  Assessment/Plan:  End stage arthritis, left knee   The patient history, physical examination, clinical judgment of the provider and imaging studies are consistent with end stage degenerative joint disease of the left knee(s) and total knee arthroplasty is deemed medically necessary. The treatment options including medical management, injection therapy arthroscopy and arthroplasty were discussed at length. The risks and benefits of total knee arthroplasty were presented and reviewed. The risks due to aseptic loosening, infection, stiffness, patella tracking problems, thromboembolic complications and other imponderables were discussed. The patient acknowledged the explanation, agreed to proceed with the plan and consent was signed. Patient is being admitted for inpatient treatment for surgery, pain control, PT, OT, prophylactic antibiotics, VTE prophylaxis, progressive ambulation and ADL's and discharge planning. The patient is planning to be discharged to skilled nursing facility [facility].  Obtained clearance PCP Tiffany Reed, [facility] [place].   

## 2013-01-15 ENCOUNTER — Encounter (HOSPITAL_COMMUNITY): Payer: Self-pay | Admitting: Vascular Surgery

## 2013-01-15 ENCOUNTER — Inpatient Hospital Stay (HOSPITAL_COMMUNITY)
Admission: RE | Admit: 2013-01-15 | Discharge: 2013-01-19 | DRG: 470 | Disposition: A | Discharge status: Skilled Nursing Facility | Payer: Medicare Other | Source: Ambulatory Visit | Attending: Orthopedic Surgery | Admitting: Orthopedic Surgery

## 2013-01-15 ENCOUNTER — Encounter (HOSPITAL_COMMUNITY)
Admission: RE | Disposition: A | Discharge status: Skilled Nursing Facility | Payer: Self-pay | Source: Ambulatory Visit | Attending: Orthopedic Surgery

## 2013-01-15 ENCOUNTER — Encounter (HOSPITAL_COMMUNITY): Payer: Self-pay | Admitting: *Deleted

## 2013-01-15 ENCOUNTER — Ambulatory Visit (HOSPITAL_COMMUNITY): Payer: Medicare Other | Admitting: Vascular Surgery

## 2013-01-15 DIAGNOSIS — Z01812 Encounter for preprocedural laboratory examination: Secondary | ICD-10-CM

## 2013-01-15 DIAGNOSIS — Z96659 Presence of unspecified artificial knee joint: Secondary | ICD-10-CM

## 2013-01-15 DIAGNOSIS — D62 Acute posthemorrhagic anemia: Secondary | ICD-10-CM | POA: Diagnosis not present

## 2013-01-15 DIAGNOSIS — I442 Atrioventricular block, complete: Secondary | ICD-10-CM | POA: Diagnosis not present

## 2013-01-15 DIAGNOSIS — M1710 Unilateral primary osteoarthritis, unspecified knee: Principal | ICD-10-CM | POA: Diagnosis present

## 2013-01-15 DIAGNOSIS — Z7901 Long term (current) use of anticoagulants: Secondary | ICD-10-CM

## 2013-01-15 DIAGNOSIS — I10 Essential (primary) hypertension: Secondary | ICD-10-CM | POA: Diagnosis present

## 2013-01-15 DIAGNOSIS — H269 Unspecified cataract: Secondary | ICD-10-CM | POA: Diagnosis present

## 2013-01-15 DIAGNOSIS — E7800 Pure hypercholesterolemia, unspecified: Secondary | ICD-10-CM | POA: Diagnosis present

## 2013-01-15 DIAGNOSIS — Z79899 Other long term (current) drug therapy: Secondary | ICD-10-CM

## 2013-01-15 HISTORY — PX: TOTAL KNEE ARTHROPLASTY: SHX125

## 2013-01-15 LAB — CBC
Hemoglobin: 13.2 g/dL (ref 13.0–17.0)
MCH: 29.4 pg (ref 26.0–34.0)
RBC: 4.49 MIL/uL (ref 4.22–5.81)

## 2013-01-15 LAB — TYPE AND SCREEN
ABO/RH(D): A POS
Antibody Screen: POSITIVE

## 2013-01-15 LAB — CREATININE, SERUM: Creatinine, Ser: 0.93 mg/dL (ref 0.50–1.35)

## 2013-01-15 SURGERY — ARTHROPLASTY, KNEE, TOTAL
Anesthesia: General | Site: Knee | Laterality: Left | Wound class: Clean

## 2013-01-15 MED ORDER — BUPIVACAINE-EPINEPHRINE PF 0.5-1:200000 % IJ SOLN
INTRAMUSCULAR | Status: DC | PRN
  Administered 2013-01-15: 30 mL

## 2013-01-15 MED ORDER — CHLORHEXIDINE GLUCONATE 4 % EX LIQD: 60.0000 mL | Freq: Once | CUTANEOUS | Status: DC

## 2013-01-15 MED ORDER — MIDAZOLAM HCL 2 MG/2ML IJ SOLN
1.0000 mg | INTRAMUSCULAR | Status: DC | PRN
  Administered 2013-01-15: 2 mg via INTRAVENOUS

## 2013-01-15 MED ORDER — PHENOL 1.4 % MT LIQD: 1.0000 | OROMUCOSAL | Status: DC | PRN

## 2013-01-15 MED ORDER — PROMETHAZINE HCL 25 MG/ML IJ SOLN: 6.2500 mg | INTRAMUSCULAR | Status: DC | PRN

## 2013-01-15 MED ORDER — ACETAMINOPHEN 10 MG/ML IV SOLN
INTRAVENOUS | Status: AC
  Filled 2013-01-15: qty 100

## 2013-01-15 MED ORDER — FLEET ENEMA 7-19 GM/118ML RE ENEM: 1.0000 | ENEMA | Freq: Once | RECTAL | Status: AC | PRN

## 2013-01-15 MED ORDER — METHOCARBAMOL 100 MG/ML IJ SOLN
500.0000 mg | Freq: Four times a day (QID) | INTRAVENOUS | Status: DC | PRN
  Filled 2013-01-15: qty 5

## 2013-01-15 MED ORDER — ENOXAPARIN SODIUM 30 MG/0.3ML ~~LOC~~ SOLN
30.0000 mg | Freq: Two times a day (BID) | SUBCUTANEOUS | Status: DC
  Administered 2013-01-16 – 2013-01-19 (×7): 30 mg via SUBCUTANEOUS
  Filled 2013-01-15 (×9): qty 0.3

## 2013-01-15 MED ORDER — MIDAZOLAM HCL 5 MG/5ML IJ SOLN
INTRAMUSCULAR | Status: DC | PRN
  Administered 2013-01-15: 2 mg via INTRAVENOUS

## 2013-01-15 MED ORDER — ACETAMINOPHEN 325 MG PO TABS: 650.0000 mg | ORAL_TABLET | Freq: Four times a day (QID) | ORAL | Status: DC | PRN

## 2013-01-15 MED ORDER — OXYCODONE HCL 5 MG PO TABS
5.0000 mg | ORAL_TABLET | ORAL | Status: DC | PRN
  Administered 2013-01-16: 5 mg via ORAL
  Administered 2013-01-17: 10 mg via ORAL
  Administered 2013-01-17: 5 mg via ORAL
  Administered 2013-01-17: 10 mg via ORAL
  Administered 2013-01-17: 5 mg via ORAL
  Administered 2013-01-17 – 2013-01-18 (×5): 10 mg via ORAL
  Filled 2013-01-15 (×3): qty 1
  Filled 2013-01-15 (×3): qty 2
  Filled 2013-01-15: qty 1
  Filled 2013-01-15 (×3): qty 2
  Filled 2013-01-15: qty 1

## 2013-01-15 MED ORDER — ATORVASTATIN CALCIUM 40 MG PO TABS
40.0000 mg | ORAL_TABLET | Freq: Every day | ORAL | Status: DC
  Administered 2013-01-15 – 2013-01-18 (×4): 40 mg via ORAL
  Filled 2013-01-15 (×5): qty 1

## 2013-01-15 MED ORDER — ACETAMINOPHEN 650 MG RE SUPP: 650.0000 mg | Freq: Four times a day (QID) | RECTAL | Status: DC | PRN

## 2013-01-15 MED ORDER — ARTIFICIAL TEARS OP OINT
TOPICAL_OINTMENT | OPHTHALMIC | Status: DC | PRN
  Administered 2013-01-15: 1 via OPHTHALMIC

## 2013-01-15 MED ORDER — FENTANYL CITRATE 0.05 MG/ML IJ SOLN
50.0000 ug | INTRAMUSCULAR | Status: DC | PRN
  Administered 2013-01-15: 100 ug via INTRAVENOUS

## 2013-01-15 MED ORDER — MIDAZOLAM HCL 2 MG/2ML IJ SOLN: 0.5000 mg | Freq: Once | INTRAMUSCULAR | Status: DC | PRN

## 2013-01-15 MED ORDER — MEPERIDINE HCL 25 MG/ML IJ SOLN: 6.2500 mg | INTRAMUSCULAR | Status: DC | PRN

## 2013-01-15 MED ORDER — METOCLOPRAMIDE HCL 10 MG PO TABS: 5.0000 mg | ORAL_TABLET | Freq: Three times a day (TID) | ORAL | Status: DC | PRN

## 2013-01-15 MED ORDER — SODIUM CHLORIDE 0.9 % IV SOLN: INTRAVENOUS | Status: DC

## 2013-01-15 MED ORDER — 0.9 % SODIUM CHLORIDE (POUR BTL) OPTIME
TOPICAL | Status: DC | PRN
  Administered 2013-01-15: 1000 mL

## 2013-01-15 MED ORDER — CEFAZOLIN SODIUM 1-5 GM-% IV SOLN
1.0000 g | Freq: Four times a day (QID) | INTRAVENOUS | Status: AC
  Administered 2013-01-15 (×2): 1 g via INTRAVENOUS
  Filled 2013-01-15 (×2): qty 50

## 2013-01-15 MED ORDER — HYDROMORPHONE HCL PF 1 MG/ML IJ SOLN
INTRAMUSCULAR | Status: AC
  Administered 2013-01-15: 0.5 mg via INTRAVENOUS
  Filled 2013-01-15: qty 1

## 2013-01-15 MED ORDER — ACETAMINOPHEN 10 MG/ML IV SOLN
1000.0000 mg | Freq: Four times a day (QID) | INTRAVENOUS | Status: DC
  Administered 2013-01-15 (×2): 1000 mg via INTRAVENOUS

## 2013-01-15 MED ORDER — HYDROCHLOROTHIAZIDE 25 MG PO TABS
25.0000 mg | ORAL_TABLET | Freq: Every day | ORAL | Status: DC
  Administered 2013-01-15 – 2013-01-19 (×4): 25 mg via ORAL
  Filled 2013-01-15 (×5): qty 1

## 2013-01-15 MED ORDER — METHOCARBAMOL 500 MG PO TABS
500.0000 mg | ORAL_TABLET | Freq: Four times a day (QID) | ORAL | Status: DC | PRN
  Administered 2013-01-16 – 2013-01-19 (×9): 500 mg via ORAL
  Filled 2013-01-15 (×9): qty 1

## 2013-01-15 MED ORDER — SENNOSIDES-DOCUSATE SODIUM 8.6-50 MG PO TABS
1.0000 | ORAL_TABLET | Freq: Every evening | ORAL | Status: DC | PRN
  Administered 2013-01-17: 1 via ORAL

## 2013-01-15 MED ORDER — MIDAZOLAM HCL 2 MG/2ML IJ SOLN
INTRAMUSCULAR | Status: AC
  Administered 2013-01-15: 2 mg via INTRAVENOUS
  Filled 2013-01-15: qty 2

## 2013-01-15 MED ORDER — ONDANSETRON HCL 4 MG/2ML IJ SOLN
INTRAMUSCULAR | Status: DC | PRN
  Administered 2013-01-15: 4 mg via INTRAVENOUS

## 2013-01-15 MED ORDER — METHOCARBAMOL 500 MG PO TABS: 500.0000 mg | ORAL_TABLET | Freq: Four times a day (QID) | ORAL | Status: DC

## 2013-01-15 MED ORDER — METHOCARBAMOL 100 MG/ML IJ SOLN
500.0000 mg | INTRAVENOUS | Status: AC
  Administered 2013-01-15: 500 mg via INTRAVENOUS
  Filled 2013-01-15: qty 5

## 2013-01-15 MED ORDER — LISINOPRIL-HYDROCHLOROTHIAZIDE 20-25 MG PO TABS: 1.0000 | ORAL_TABLET | Freq: Every day | ORAL | Status: DC

## 2013-01-15 MED ORDER — LISINOPRIL 20 MG PO TABS
20.0000 mg | ORAL_TABLET | Freq: Every day | ORAL | Status: DC
  Administered 2013-01-15 – 2013-01-19 (×4): 20 mg via ORAL
  Filled 2013-01-15 (×5): qty 1

## 2013-01-15 MED ORDER — GLYCOPYRROLATE 0.2 MG/ML IJ SOLN
INTRAMUSCULAR | Status: DC | PRN
  Administered 2013-01-15: .8 mg via INTRAVENOUS

## 2013-01-15 MED ORDER — SODIUM CHLORIDE 0.9 % IV SOLN
INTRAVENOUS | Status: DC
  Administered 2013-01-15 – 2013-01-16 (×2): via INTRAVENOUS

## 2013-01-15 MED ORDER — HYDROMORPHONE HCL PF 1 MG/ML IJ SOLN: 1.0000 mg | INTRAMUSCULAR | Status: DC | PRN

## 2013-01-15 MED ORDER — PROPOFOL 10 MG/ML IV BOLUS
INTRAVENOUS | Status: DC | PRN
  Administered 2013-01-15: 200 mg via INTRAVENOUS

## 2013-01-15 MED ORDER — FENTANYL CITRATE 0.05 MG/ML IJ SOLN
INTRAMUSCULAR | Status: AC
  Administered 2013-01-15: 100 ug via INTRAVENOUS
  Filled 2013-01-15: qty 2

## 2013-01-15 MED ORDER — DOCUSATE SODIUM 100 MG PO CAPS
100.0000 mg | ORAL_CAPSULE | Freq: Two times a day (BID) | ORAL | Status: DC
  Administered 2013-01-15 – 2013-01-19 (×8): 100 mg via ORAL
  Filled 2013-01-15 (×8): qty 1

## 2013-01-15 MED ORDER — BISACODYL 10 MG RE SUPP: 10.0000 mg | Freq: Every day | RECTAL | Status: DC | PRN

## 2013-01-15 MED ORDER — LACTATED RINGERS IV SOLN
INTRAVENOUS | Status: DC
  Administered 2013-01-15: 09:00:00 via INTRAVENOUS

## 2013-01-15 MED ORDER — METOCLOPRAMIDE HCL 5 MG/ML IJ SOLN: 5.0000 mg | Freq: Three times a day (TID) | INTRAMUSCULAR | Status: DC | PRN

## 2013-01-15 MED ORDER — ACETAMINOPHEN 10 MG/ML IV SOLN: 1000.0000 mg | Freq: Once | INTRAVENOUS | Status: DC

## 2013-01-15 MED ORDER — ROCURONIUM BROMIDE 100 MG/10ML IV SOLN
INTRAVENOUS | Status: DC | PRN
  Administered 2013-01-15: 50 mg via INTRAVENOUS

## 2013-01-15 MED ORDER — ONDANSETRON HCL 4 MG PO TABS: 4.0000 mg | ORAL_TABLET | Freq: Four times a day (QID) | ORAL | Status: DC | PRN

## 2013-01-15 MED ORDER — OXYCODONE HCL 5 MG/5ML PO SOLN: 5.0000 mg | Freq: Once | ORAL | Status: DC | PRN

## 2013-01-15 MED ORDER — ONDANSETRON HCL 4 MG/2ML IJ SOLN: 4.0000 mg | Freq: Four times a day (QID) | INTRAMUSCULAR | Status: DC | PRN

## 2013-01-15 MED ORDER — DEXAMETHASONE SODIUM PHOSPHATE 4 MG/ML IJ SOLN
INTRAMUSCULAR | Status: DC | PRN
  Administered 2013-01-15: 8 mg via INTRAVENOUS

## 2013-01-15 MED ORDER — OXYCODONE HCL 5 MG PO TABS: 5.0000 mg | ORAL_TABLET | Freq: Once | ORAL | Status: DC | PRN

## 2013-01-15 MED ORDER — ENOXAPARIN SODIUM 30 MG/0.3ML ~~LOC~~ SOLN: 30.0000 mg | Freq: Two times a day (BID) | SUBCUTANEOUS | Status: DC

## 2013-01-15 MED ORDER — LIDOCAINE HCL (CARDIAC) 20 MG/ML IV SOLN
INTRAVENOUS | Status: DC | PRN
  Administered 2013-01-15: 30 mg via INTRAVENOUS

## 2013-01-15 MED ORDER — OMEGA-3-ACID ETHYL ESTERS 1 G PO CAPS
1.0000 g | ORAL_CAPSULE | Freq: Three times a day (TID) | ORAL | Status: DC
  Administered 2013-01-15 – 2013-01-19 (×10): 1 g via ORAL
  Filled 2013-01-15 (×13): qty 1

## 2013-01-15 MED ORDER — ACETAMINOPHEN 10 MG/ML IV SOLN: 1000.0000 mg | Freq: Four times a day (QID) | INTRAVENOUS | Status: DC

## 2013-01-15 MED ORDER — MENTHOL 3 MG MT LOZG: 1.0000 | LOZENGE | OROMUCOSAL | Status: DC | PRN

## 2013-01-15 MED ORDER — ACETAMINOPHEN 10 MG/ML IV SOLN
1000.0000 mg | Freq: Four times a day (QID) | INTRAVENOUS | Status: AC
  Administered 2013-01-15 – 2013-01-16 (×4): 1000 mg via INTRAVENOUS
  Filled 2013-01-15 (×4): qty 100

## 2013-01-15 MED ORDER — OXYCODONE HCL 5 MG PO TABS: ORAL_TABLET | ORAL | Status: DC

## 2013-01-15 MED ORDER — LACTATED RINGERS IV SOLN
INTRAVENOUS | Status: DC | PRN
  Administered 2013-01-15 (×2): via INTRAVENOUS

## 2013-01-15 MED ORDER — ATROPINE SULFATE 1 MG/ML IJ SOLN
INTRAMUSCULAR | Status: DC | PRN
  Administered 2013-01-15: 0.2 mg via INTRAVENOUS

## 2013-01-15 MED ORDER — NEOSTIGMINE METHYLSULFATE 1 MG/ML IJ SOLN
INTRAMUSCULAR | Status: DC | PRN
  Administered 2013-01-15: 4 mg via INTRAVENOUS

## 2013-01-15 MED ORDER — FENTANYL CITRATE 0.05 MG/ML IJ SOLN
INTRAMUSCULAR | Status: DC | PRN
  Administered 2013-01-15 (×2): 50 ug via INTRAVENOUS
  Administered 2013-01-15 (×2): 25 ug via INTRAVENOUS
  Administered 2013-01-15: 50 ug via INTRAVENOUS

## 2013-01-15 MED ORDER — HYDROMORPHONE HCL PF 1 MG/ML IJ SOLN
0.2500 mg | INTRAMUSCULAR | Status: DC | PRN
  Administered 2013-01-15 (×3): 0.5 mg via INTRAVENOUS

## 2013-01-15 MED ORDER — SODIUM CHLORIDE 0.9 % IR SOLN
Status: DC | PRN
  Administered 2013-01-15: 3000 mL

## 2013-01-15 SURGICAL SUPPLY — 59 items
BANDAGE ELASTIC 4 VELCRO ST LF (GAUZE/BANDAGES/DRESSINGS) ×2 IMPLANT
BANDAGE ELASTIC 6 VELCRO ST LF (GAUZE/BANDAGES/DRESSINGS) ×2 IMPLANT
BANDAGE ESMARK 6X9 LF (GAUZE/BANDAGES/DRESSINGS) ×1 IMPLANT
BLADE SAGITTAL 25.0X1.19X90 (BLADE) ×2 IMPLANT
BLADE SAW SAG 90X13X1.27 (BLADE) ×2 IMPLANT
BNDG ESMARK 6X9 LF (GAUZE/BANDAGES/DRESSINGS) ×2
BOWL SMART MIX CTS (DISPOSABLE) ×2 IMPLANT
CEMENT HV SMART SET (Cement) ×4 IMPLANT
CLOTH BEACON ORANGE TIMEOUT ST (SAFETY) ×2 IMPLANT
COVER BACK TABLE 24X17X13 BIG (DRAPES) IMPLANT
COVER SURGICAL LIGHT HANDLE (MISCELLANEOUS) ×2 IMPLANT
CUFF TOURNIQUET SINGLE 34IN LL (TOURNIQUET CUFF) ×2 IMPLANT
CUFF TOURNIQUET SINGLE 44IN (TOURNIQUET CUFF) IMPLANT
DRAPE INCISE IOBAN 66X45 STRL (DRAPES) IMPLANT
DRAPE ORTHO SPLIT 77X108 STRL (DRAPES) ×2
DRAPE SURG ORHT 6 SPLT 77X108 (DRAPES) ×2 IMPLANT
DRAPE U-SHAPE 47X51 STRL (DRAPES) ×2 IMPLANT
DRSG ADAPTIC 3X8 NADH LF (GAUZE/BANDAGES/DRESSINGS) ×2 IMPLANT
DRSG PAD ABDOMINAL 8X10 ST (GAUZE/BANDAGES/DRESSINGS) ×2 IMPLANT
DURAPREP 26ML APPLICATOR (WOUND CARE) ×2 IMPLANT
ELECT REM PT RETURN 9FT ADLT (ELECTROSURGICAL) ×2
ELECTRODE REM PT RTRN 9FT ADLT (ELECTROSURGICAL) ×1 IMPLANT
EVACUATOR 1/8 PVC DRAIN (DRAIN) ×2 IMPLANT
FACESHIELD LNG OPTICON STERILE (SAFETY) ×4 IMPLANT
FLOSEAL 10ML (HEMOSTASIS) IMPLANT
GLOVE BIOGEL PI IND STRL 8 (GLOVE) ×4 IMPLANT
GLOVE BIOGEL PI INDICATOR 8 (GLOVE) ×4
GLOVE ORTHO TXT STRL SZ7.5 (GLOVE) ×6 IMPLANT
GLOVE SURG ORTHO 8.0 STRL STRW (GLOVE) ×6 IMPLANT
GOWN PREVENTION PLUS XLARGE (GOWN DISPOSABLE) ×2 IMPLANT
GOWN PREVENTION PLUS XXLARGE (GOWN DISPOSABLE) ×2 IMPLANT
GOWN STRL NON-REIN LRG LVL3 (GOWN DISPOSABLE) ×4 IMPLANT
HANDPIECE INTERPULSE COAX TIP (DISPOSABLE) ×1
HOOD PEEL AWAY FACE SHEILD DIS (HOOD) ×2 IMPLANT
IMMOBILIZER KNEE 22 UNIV (SOFTGOODS) ×4 IMPLANT
KIT BASIN OR (CUSTOM PROCEDURE TRAY) ×2 IMPLANT
KIT ROOM TURNOVER OR (KITS) ×2 IMPLANT
MANIFOLD NEPTUNE II (INSTRUMENTS) ×2 IMPLANT
NEEDLE 22X1 1/2 (OR ONLY) (NEEDLE) IMPLANT
NS IRRIG 1000ML POUR BTL (IV SOLUTION) ×2 IMPLANT
PACK TOTAL JOINT (CUSTOM PROCEDURE TRAY) ×2 IMPLANT
PAD ARMBOARD 7.5X6 YLW CONV (MISCELLANEOUS) ×4 IMPLANT
PAD CAST 4YDX4 CTTN HI CHSV (CAST SUPPLIES) ×1 IMPLANT
PADDING CAST COTTON 4X4 STRL (CAST SUPPLIES) ×1
PADDING CAST COTTON 6X4 STRL (CAST SUPPLIES) ×2 IMPLANT
SET HNDPC FAN SPRY TIP SCT (DISPOSABLE) ×1 IMPLANT
SPONGE GAUZE 4X4 12PLY (GAUZE/BANDAGES/DRESSINGS) ×2 IMPLANT
STAPLER VISISTAT 35W (STAPLE) ×2 IMPLANT
SUCTION FRAZIER TIP 10 FR DISP (SUCTIONS) ×2 IMPLANT
SUT ETHIBOND NAB CT1 #1 30IN (SUTURE) ×6 IMPLANT
SUT VIC AB 0 CT1 27 (SUTURE) ×1
SUT VIC AB 0 CT1 27XBRD ANBCTR (SUTURE) ×1 IMPLANT
SUT VIC AB 2-0 CT1 27 (SUTURE) ×2
SUT VIC AB 2-0 CT1 TAPERPNT 27 (SUTURE) ×2 IMPLANT
SYR CONTROL 10ML LL (SYRINGE) IMPLANT
TOWEL OR 17X24 6PK STRL BLUE (TOWEL DISPOSABLE) ×2 IMPLANT
TOWEL OR 17X26 10 PK STRL BLUE (TOWEL DISPOSABLE) ×2 IMPLANT
TRAY FOLEY CATH 14FR (SET/KITS/TRAYS/PACK) ×2 IMPLANT
WATER STERILE IRR 1000ML POUR (IV SOLUTION) ×6 IMPLANT

## 2013-01-15 NOTE — Progress Notes (Signed)
Anesthesiology:  67 year old male underwent L. Total knee arthroplasty under general anesthesia with pre-op femoral nerve block. Intra-operatively patient had transient episode of complete heart block with escape rhythm in 20s, given 0.2 mg of atropine with restoration of SR with HR in 70s  Patient is S/P ASD repair in 1962 had  a low risk nuclear stress test 11/25/12. EF 65 % by echo 03/31/08.   Patient now in PACU awake, alert feels fine  VS T-36.9 BP 134/49 HR 68 (SR) RR 17   ECG  SR HR 66 with first degree AV block and mild IVCD no acute ST/T wave changes  Impression:  Transient intraoperative CHB patient now appears stable  Plan:  1. Admit to telemetry 2. Cardiology consult  Kipp Brood, MD

## 2013-01-15 NOTE — Anesthesia Preprocedure Evaluation (Addendum)
Anesthesia Evaluation  Patient identified by MRN, date of birth, ID band Patient awake    Reviewed: Allergy & Precautions, H&P , NPO status , Patient's Chart, lab work & pertinent test results  Airway Mallampati: II TM Distance: >3 FB Neck ROM: Full    Dental  (+) Poor Dentition and Dental Advisory Given   Pulmonary neg pulmonary ROS,  breath sounds clear to auscultation  Pulmonary exam normal       Cardiovascular hypertension, Pt. on medications Rhythm:Regular Rate:Normal  11/13 stress test: no ischemia or scar, EF 80% Pt is s/p ASD reair   Neuro/Psych CVA (patient not sure if he had CVA), No Residual Symptoms negative neurological ROS     GI/Hepatic negative GI ROS, Neg liver ROS,   Endo/Other  Morbid obesity  Renal/GU negative Renal ROS     Musculoskeletal   Abdominal (+) + obese,   Peds  Hematology   Anesthesia Other Findings   Reproductive/Obstetrics                          Anesthesia Physical Anesthesia Plan  ASA: III  Anesthesia Plan: General   Post-op Pain Management:    Induction: Intravenous  Airway Management Planned: Oral ETT  Additional Equipment:   Intra-op Plan:   Post-operative Plan: Extubation in OR  Informed Consent: I have reviewed the patients History and Physical, chart, labs and discussed the procedure including the risks, benefits and alternatives for the proposed anesthesia with the patient or authorized representative who has indicated his/her understanding and acceptance.   Dental advisory given  Plan Discussed with: CRNA and Surgeon  Anesthesia Plan Comments: (Plan routine monitors, GETA with femoral nerve block for post op analgesia)        Anesthesia Quick Evaluation

## 2013-01-15 NOTE — Interval H&P Note (Signed)
History and Physical Interval Note:  01/15/2013 9:57 AM  Dean Fry  has presented today for surgery, with the diagnosis of OA LEFT KNEE  The various methods of treatment have been discussed with the patient and family. After consideration of risks, benefits and other options for treatment, the patient has consented to  Procedure(s) (LRB) with comments: TOTAL KNEE ARTHROPLASTY (Left) as a surgical intervention .  The patient's history has been reviewed, patient examined, no change in status, stable for surgery.  I have reviewed the patient's chart and labs.  Questions were answered to the patient's satisfaction.     Giulia Hickey JR,W D

## 2013-01-15 NOTE — Progress Notes (Signed)
Orthopedic Tech Progress Note Patient Details:  Dean Fry [birth year] [id number]  CPM Left Knee CPM Left Knee: On Left Knee Flexion (Degrees): 60  Left Knee Extension (Degrees): 0    [name] 01/15/2013, 2:17 PM

## 2013-01-15 NOTE — H&P (View-Only) (Signed)
TOTAL KNEE ADMISSION H&P  Patient is being admitted for left total knee arthroplasty.  Subjective:  Chief Complaint:left knee pain.  HPI: Dean Fry, 67 y.o. male, has a history of pain and functional disability in the left knee due to arthritis and has failed non-surgical conservative treatments for greater than 12 weeks to includeNSAID's and/or analgesics, corticosteriod injections, viscosupplementation injections, supervised PT with diminished ADL's post treatment and activity modification.  Onset of symptoms was gradual, starting years ago with gradually worsening course since that time. The patient noted no past surgery on the left knee(s).  Patient currently rates pain in the left knee(s) as moderate to severe with activity. Patient has night pain, worsening of pain with activity and weight bearing, pain that interferes with activities of daily living and crepitus.  Patient has evidence of periarticular osteophytes and joint space narrowing by imaging studies. There is no active infection.  There are no active problems to display for this patient.  Past Medical History  Diagnosis Date  . Impaired vision     glasses  . Hypercholesteremia   . Stroke 03/14/2008    pt states that he may have had a mild stroke 1 week after knee surgery  . Arthritis     left knee  . Impaired hearing   . Atrial septal defect     corrected in 1962  . Rheumatic fever     at 67 years old  . Low back pain   . Shoulder pain     left, sees chiropracter for   . Cataracts, bilateral     "early stages"  . Hypertension     Sees Dr. Dorris Fetch    Past Surgical History  Procedure Date  . Asd repair   . Total knee arthroplasty     right knee  . Knee surgery     4 surgeries on right knee  . Appendectomy   . Plantar fascititis     bilateral surgery   . Asd repair   . Tonsillectomy     and removal of adnoids     (Not in a hospital admission) No Known Allergies  History  Substance Use  Topics  . Smoking status: Never Smoker   . Smokeless tobacco: Not on file  . Alcohol Use: 1.0 oz/week    2 drink(s) per week     Comment: occasional use    Family History  Problem Relation Age of Onset  . Adopted: Yes     Review of Systems  Constitutional: Negative.   HENT: Positive for hearing loss and nosebleeds. Negative for ear pain, sore throat and tinnitus.   Eyes: Negative for blurred vision, double vision, photophobia and pain.  Respiratory: Negative.   Cardiovascular: Negative.   Gastrointestinal: Negative.   Genitourinary: Negative.   Musculoskeletal: Positive for joint pain. Negative for falls.  Skin: Negative.   Neurological: Negative.  Negative for headaches.  Endo/Heme/Allergies: Negative.   Psychiatric/Behavioral: Negative.     Objective:  Physical Exam  Constitutional: He is oriented to person, place, and time. He appears well-developed and well-nourished. No distress.  HENT:  Head: Normocephalic and atraumatic.  Nose: Nose normal.  Eyes: Conjunctivae normal and EOM are normal. Pupils are equal, round, and reactive to light.  Neck: Normal range of motion. Neck supple.  Cardiovascular: Normal rate, regular rhythm, normal heart sounds and intact distal pulses.   No murmur heard. Respiratory: Effort normal and breath sounds normal. No respiratory distress. He has no wheezes. He exhibits no  tenderness.  GI: Soft. Bowel sounds are normal. He exhibits no distension. There is no tenderness.  Musculoskeletal:       Left knee: He exhibits bony tenderness. tenderness found. Medial joint line and lateral joint line tenderness noted.       Varus deformity knee, large varicosities anterior knee, intact DF and PF ankle, no calf tenderness, stable ligamentous testing knee  Neurological: He is alert and oriented to person, place, and time. No cranial nerve deficit.  Skin: Skin is warm and dry. No rash noted. No erythema. No pallor.  Psychiatric: He has a normal mood and  affect. His behavior is normal.    Vital signs in last 24 hours: @VSRANGES @  Labs:   Estimated Body mass index is 35.86 kg/(m^2) as calculated from the following:   Height as of 11/02/12: 5\' 11" (1.803 m).   Weight as of 11/02/12: 257 lb 1.6 oz(116.62 kg).   Imaging Review Plain radiographs demonstrate severe degenerative joint disease of the left knee(s). The overall alignment issignificant varus. The bone quality appears to be good for age and reported activity level.  Assessment/Plan:  End stage arthritis, left knee   The patient history, physical examination, clinical judgment of the provider and imaging studies are consistent with end stage degenerative joint disease of the left knee(s) and total knee arthroplasty is deemed medically necessary. The treatment options including medical management, injection therapy arthroscopy and arthroplasty were discussed at length. The risks and benefits of total knee arthroplasty were presented and reviewed. The risks due to aseptic loosening, infection, stiffness, patella tracking problems, thromboembolic complications and other imponderables were discussed. The patient acknowledged the explanation, agreed to proceed with the plan and consent was signed. Patient is being admitted for inpatient treatment for surgery, pain control, PT, OT, prophylactic antibiotics, VTE prophylaxis, progressive ambulation and ADL's and discharge planning. The patient is planning to be discharged to skilled nursing facility [facility].  Obtained clearance PCP Bufford Spikes, [facility] [place].

## 2013-01-15 NOTE — Anesthesia Procedure Notes (Addendum)
Anesthesia Regional Block:  Femoral nerve block  Pre-Anesthetic Checklist: ,, timeout performed, Correct Patient, Correct Site, Correct Laterality, Correct Procedure, Correct Position, site marked, Risks and benefits discussed,  Surgical consent,  Pre-op evaluation,  At surgeon's request and post-op pain management  Laterality: Left  Prep: chloraprep and alcohol swabs       Needles:  Injection technique: Single-shot  Needle Type: Stimulator Needle - 40     Needle Length: 4cm  Needle Gauge: 22 and 22 G    Additional Needles:  Procedures: nerve stimulator Femoral nerve block  Nerve Stimulator or Paresthesia:  Response: patella twitch, 0.45 mA, 0.1 ms,   Additional Responses:   Narrative:  Start time: 01/15/2013 9:22 AM End time: 01/15/2013 9:28 AM Injection made incrementally with aspirations every 5 mL.  Performed by: Personally  Anesthesiologist: Sandford Craze, MD  Additional Notes: Pt identified in Holding room.  Monitors applied. Working IV access confirmed. Sterile prep L groin.  #22ga PNS to patella twitch at 0.1mA threshold.  30cc 0.5% Bupivacaine with 1:200k epi injected incrementally after negative test dose.  Patient asymptomatic, VSS, no heme aspirated, tolerated well.   Sandford Craze, MD   Procedure Name: Intubation Date/Time: 01/15/2013 10:42 AM Performed by: Luster Landsberg Pre-anesthesia Checklist: Patient identified, Emergency Drugs available, Suction available and Patient being monitored Patient Re-evaluated:Patient Re-evaluated prior to inductionOxygen Delivery Method: Circle system utilized Preoxygenation: Pre-oxygenation with 100% oxygen Intubation Type: IV induction Ventilation: Mask ventilation without difficulty and Oral airway inserted - appropriate to patient size Laryngoscope Size: [place] Pall, 3 and 4 Grade View: Grade IV Tube type: Oral Tube size: 8.0 mm Number of attempts: 3 Airway Equipment and Method: Stylet Placement Confirmation: ETT inserted  through vocal cords under direct vision,  positive ETCO2 and breath sounds checked- equal and bilateral Secured at: 24 cm Tube secured with: Tape Dental Injury: Bloody posterior oropharynx  Difficulty Due To: Difficulty was unanticipated and Difficult Airway- due to dentition Future Recommendations: Recommend- induction with short-acting agent, and alternative techniques readily available Comments: Pt easy mask with OAW in place DVL x1 by paramedic student with MAC 4- unable to obtain view DVL x1 with Miller 3 by Dr. Noreene Larsson - unable to obtain view of cords DVL x1 with Glidescope by Dr. Noreene Larsson - with grade II view.  ETT passed using glidescope stylet.  Teeth/lips as pre-op.

## 2013-01-15 NOTE — Transfer of Care (Signed)
Immediate Anesthesia Transfer of Care Note  Patient: Dean Fry  Procedure(s) Performed: Procedure(s) (LRB) with comments: TOTAL KNEE ARTHROPLASTY (Left) - left total knee arthroplasty  Patient Location: PACU  Anesthesia Type:General  Level of Consciousness: awake, alert  and oriented  Airway & Oxygen Therapy: Patient Spontanous Breathing and Patient connected to nasal cannula oxygen  Post-op Assessment: Report given to PACU RN, Post -op Vital signs reviewed and stable and Patient moving all extremities  Post vital signs: Reviewed and stable  Complications: No apparent anesthesia complications

## 2013-01-15 NOTE — Plan of Care (Signed)
Problem: Consults Goal: Diagnosis- Total Joint Replacement Primary Total Knee LEFT     

## 2013-01-15 NOTE — Progress Notes (Signed)
Dr.Joslin at beside to eval pt, new orders rec'd for 12 lead ekg for decreased HR intra-op, paged for ekg, will cont to monitor

## 2013-01-15 NOTE — Consult Note (Signed)
CARDIOLOGY CONSULT NOTE  Patient ID: Dean Fry MRN: [id number] DOB/AGE: 07/02/1946 67 y.o.  Admit date: 01/15/2013 Referring Physician  Dr. Kipp Brood Primary Physician:  Bufford Spikes, DO Reason for Consultation  Heart Block  HOPI: Patient is a 67 year old Caucasian male with history of remote atrial septal defect repair, history of rheumatic fever at 67 years of age and degenerative joint disease who recently underwent left knee replacement. Perioperatively in the recovery room, patient had a transient episode of complete heart block for maximum of 3 seconds and about 3 episodes of less than 1.5 second pauses. This was in the form of complete heart block. Patient has underlying conduction system disease. Patient previously has been completely asymptomatic with regard to heart block and this includes dizziness, fatigue, syncope. Preoperatively he had undergone Lexiscan sestamibi stress test on 11/25/2012, and his EKG at that time had revealed sinus rhythm with first degree AV block, left axis deviation, left and physical block, right bundle branch block, trifascicular block. Do to complete heart block his of her to me for further evaluation. Patient denies any chest pain or shortness of breath. He underwent surgery without any complications otherwise. Presently remains asymptomatic except for left leg pain but he is very motivated to going through physical therapy and rehabilitation. No complaints of dizziness or syncope.   Past Medical History  Diagnosis Date  . Impaired vision     glasses  . Hypercholesteremia   . Stroke 03/14/2008    pt states that he may have had a mild stroke 1 week after knee surgery  . Arthritis     left knee  . Impaired hearing   . Atrial septal defect     corrected in 1962  . Rheumatic fever     at 67 years old  . Low back pain   . Shoulder pain     left, sees chiropracter for   . Cataracts, bilateral     "early stages"  . Hypertension     Sees Dr.  Dorris Fetch     Past Surgical History  Procedure Date  . Asd repair   . Total knee arthroplasty     right knee  . Knee surgery     4 surgeries on right knee  . Appendectomy   . Plantar fascititis     bilateral surgery   . Asd repair   . Tonsillectomy     and removal of adnoids     Family History  Problem Relation Age of Onset  . Adopted: Yes     Social History: History   Social History  . Marital Status: Divorced    Spouse Name: N/A    Number of Children: N/A  . Years of Education: N/A   Occupational History  . Not on file.   Social History Main Topics  . Smoking status: Never Smoker   . Smokeless tobacco: Not on file  . Alcohol Use: 1.0 oz/week    2 drink(s) per week     Comment: occasional use  . Drug Use: No  . Sexually Active: Not on file   Other Topics Concern  . Not on file   Social History Narrative  . No narrative on file     Prescriptions prior to admission  Medication Sig Dispense Refill  . atorvastatin (LIPITOR) 40 MG tablet Take 40 mg by mouth at bedtime.       . celecoxib (CELEBREX) 200 MG capsule Take 200 mg by mouth daily as needed. For pain      .  lisinopril-hydrochlorothiazide (PRINZIDE,ZESTORETIC) 20-25 MG per tablet Take 1 tablet by mouth daily.        Marland Kitchen omega-3 acid ethyl esters (LOVAZA) 1 G capsule Take 1 g by mouth 3 (three) times daily.       . [DISCONTINUED] aspirin EC 81 MG tablet Take 81 mg by mouth daily.        Scheduled Meds:   . acetaminophen      . acetaminophen  1,000 mg Intravenous Q6H  . atorvastatin  40 mg Oral QHS  .  ceFAZolin (ANCEF) IV  1 g Intravenous Q6H  . docusate sodium  100 mg Oral BID  . enoxaparin (LOVENOX) injection  30 mg Subcutaneous Q12H  . lisinopril  20 mg Oral Daily   And  . hydrochlorothiazide  25 mg Oral Daily  . omega-3 acid ethyl esters  1 g Oral TID   Continuous Infusions:   . sodium chloride    . lactated ringers 50 mL/hr at 01/15/13 0910   PRN Meds:.acetaminophen, acetaminophen,  bisacodyl, HYDROmorphone (DILAUDID) injection, menthol-cetylpyridinium, methocarbamol (ROBAXIN) IV, methocarbamol, metoCLOPramide (REGLAN) injection, metoCLOPramide, ondansetron (ZOFRAN) IV, ondansetron, oxyCODONE, phenol, senna-docusate, sodium phosphate  ROS: General: no fevers/chills/night sweats Eyes: no blurry vision, diplopia, or amaurosis ENT: no sore throat or hearing loss Resp: no cough, wheezing, or hemoptysis CV: no edema or palpitations GI: no abdominal pain, nausea, vomiting, diarrhea, or constipation GU: no dysuria, frequency, or hematuria Skin: no rash Neuro: no headache, numbness, tingling, or weakness of extremities Heme: no bleeding, DVT, or easy bruising Endo: no polydipsia or polyuria    Physical Exam: Blood pressure 157/72, pulse 80, temperature 98.5 F (36.9 C), temperature source Oral, resp. rate 16, SpO2 99.00%.   General appearance: alert, cooperative, appears stated age, no distress and mildly obese Lungs: clear to auscultation bilaterally Chest wall: no tenderness Heart: regular rate and rhythm, S1, S2 normal, no murmur, click, rub or gallop Abdomen: soft, non-tender; bowel sounds normal; no masses,  no organomegaly Extremities: extremities normal, atraumatic, no cyanosis or edema and Left knee in cast. Not examined. Pulses: 2+ and symmetric Neurologic: Grossly normal  Labs:   Lab Results  Component Value Date   WBC 13.5* 01/15/2013   HGB 13.2 01/15/2013   HCT 38.6* 01/15/2013   MCV 86.0 01/15/2013   PLT 181 01/15/2013    Lab 01/15/13 1643  NA --  K --  CL --  CO2 --  BUN --  CREATININE 0.93  CALCIUM --  PROT --  BILITOT --  ALKPHOS --  ALT --  AST --  GLUCOSE --    Lipid Panel     Component Value Date/Time   CHOL 130 02/28/2009 2102   TRIG 158* 02/28/2009 2102   HDL 30* 02/28/2009 2102   CHOLHDL 4.3 Ratio 02/28/2009 2102   VLDL 32 02/28/2009 2102   LDLCALC 68 02/28/2009 2102    EKG: Independently reviewed: Performed 01/15/2013: Sinus rhythm  with first degree AV block,  V rate 66/min. left axis deviation, incomplete right bundle branch block, no evidence of ischemia.  ASSESSMENT AND PLAN:  1. Transient complete heart block perioperatively after left the replacement. My above impression in my history of present illness. Patient has underlying conduction system disease, however is completely asymptomatic. Unless he has recurrence of complete heart block with symptomatic or greater than 2.7 seconds, would not recommend pacemaker implantation at this time. Patient has had pharmacologic stress test on 11/25/2012 which revealed mild diaphragmatic and chest wall attenuation without evidence of ischemia. There was  no heart block during lexiscan administration. hypertension. 2. Hypertension, presently patient is not on any negative chronotropic agents. He is on lisinopril. Continue the same. I would recommend checking his electrolytes on his next lab draw.  Otherwise no specific recommendations. Unless recurrence of heart block or any kind of arrhythmias, I will see him back in the office electively in one to 2 months and then make arrangements for the same through my office.  He'll need cardiology followup given his underlying conduction system disease, albeit asymptomatic.   Pamella Pert, MD 01/15/2013, 6:54 PM [facility] Pager: [phone] Office: [phone] If no answer Cell [phone]

## 2013-01-15 NOTE — Evaluation (Signed)
Physical Therapy Evaluation Patient Details Name: Dean Fry MRN: [id number] DOB: 11-Aug-1946 Today's Date: 01/15/2013 Time: 4098-1191 PT Time Calculation (min): 42 min  PT Assessment / Plan / Recommendation Clinical Impression  Patient is a 67 yo male s/p Lt. TKA.  Patient able to transfer to chair today.  Will benefit from acute PT to maximize independence and increase activity tolerance prior to discharge.  Patient lives alone.  Recommend ST-SNF for continued therapy before returning home.    PT Assessment  Patient needs continued PT services    Follow Up Recommendations  SNF    Does the patient have the potential to tolerate intense rehabilitation      Barriers to Discharge Decreased caregiver support Lives alone.    Equipment Recommendations  None recommended by PT    Recommendations for Other Services     Frequency 7X/week    Precautions / Restrictions Precautions Precautions: Knee Precaution Booklet Issued: Yes (comment) Precaution Comments: Educated patient on precautions. Required Braces or Orthoses: Knee Immobilizer - Left Knee Immobilizer - Right: On when out of bed or walking Restrictions Weight Bearing Restrictions: Yes LLE Weight Bearing: Weight bearing as tolerated   Pertinent Vitals/Pain       Mobility  Bed Mobility Bed Mobility: Supine to Sit;Sitting - Scoot to Edge of Bed Supine to Sit: 4: Min assist;HOB elevated;With rails Sitting - Scoot to Edge of Bed: 4: Min guard;With rail Details for Bed Mobility Assistance: Instructed patient on donning knee immobilizer LLE.  Verbal cues for technique.  Assist to move LLE off of bed. Transfers Transfers: Sit to Stand;Stand to Sit;Stand Pivot Transfers Sit to Stand: 4: Min assist;From elevated surface;With upper extremity assist;From bed Stand to Sit: 4: Min assist;With upper extremity assist;With armrests;To chair/3-in-1 Stand Pivot Transfers: 4: Min assist Details for Transfer Assistance:  Instructed patient on effects of nerve block on LLE.  Verbal cues on hand placement and technique to stand.  Min assist to rise to standing.  Patient stood x 3 minutes.  Able to take a few steps to pivot to chair.  Unable to step backward - moved chair toward patient. Ambulation/Gait Ambulation/Gait Assistance: Not tested (comment)       Exercises Total Joint Exercises Ankle Circles/Pumps: AROM;Both;10 reps;Seated Quad Sets: AROM;Left;5 reps;Seated   PT Diagnosis: Difficulty walking;Acute pain  PT Problem List: Decreased strength;Decreased range of motion;Decreased activity tolerance;Decreased balance;Decreased mobility;Decreased knowledge of use of DME;Decreased knowledge of precautions;Pain PT Treatment Interventions: DME instruction;Gait training;Functional mobility training;Therapeutic exercise;Patient/family education   PT Goals Acute Rehab PT Goals PT Goal Formulation: With patient Time For Goal Achievement: 01/29/13 Potential to Achieve Goals: Good Pt will go Supine/Side to Sit: Independently;with HOB 0 degrees PT Goal: Supine/Side to Sit - Progress: Goal set today Pt will go Sit to Supine/Side: Independently;with HOB 0 degrees PT Goal: Sit to Supine/Side - Progress: Goal set today Pt will go Sit to Stand: with modified independence;with upper extremity assist PT Goal: Sit to Stand - Progress: Goal set today Pt will Ambulate: >150 feet;with modified independence;with rolling walker PT Goal: Ambulate - Progress: Goal set today Pt will Perform Home Exercise Program: Independently PT Goal: Perform Home Exercise Program - Progress: Goal set today  Visit Information  Last PT Received On: 01/15/13 Assistance Needed: +1    Subjective Data  Subjective: "I'm ready to get up." Patient Stated Goal: To be able to return home independently eventually.   Prior Functioning  Home Living Lives With: Alone Available Help at Discharge: Skilled Nursing Facility Home Adaptive  Equipment:  Walker - rolling Prior Function Level of Independence: Independent Able to Take Stairs?: Yes Driving: Yes Vocation: Volunteer work Comments: Very active.  Plays golf Communication Communication: HOH (Hearing aids)    Cognition  Overall Cognitive Status: Appears within functional limits for tasks assessed/performed Arousal/Alertness: Awake/alert Orientation Level: Oriented X4 / Intact Behavior During Session: [facility] for tasks performed    Extremity/Trunk Assessment Right Upper Extremity Assessment RUE ROM/Strength/Tone: WFL for tasks assessed RUE Sensation: WFL - Light Touch Left Upper Extremity Assessment LUE ROM/Strength/Tone: WFL for tasks assessed LUE Sensation: WFL - Light Touch Right Lower Extremity Assessment RLE ROM/Strength/Tone: WFL for tasks assessed RLE Sensation: WFL - Light Touch Left Lower Extremity Assessment LLE ROM/Strength/Tone: Deficits;Unable to fully assess;Due to pain LLE ROM/Strength/Tone Deficits: Able to help move LLE off of bed. Trunk Assessment Trunk Assessment: Normal   Balance    End of Session PT - End of Session Equipment Utilized During Treatment: Gait belt;Left knee immobilizer Activity Tolerance: Patient tolerated treatment well Patient left: in chair;with call bell/phone within reach Nurse Communication: Mobility status CPM Left Knee CPM Left Knee: Off  GP     Vena Austria 01/15/2013, 7:09 PM Durenda Hurt. Renaldo Fiddler, [facility] Acute Rehab Services Pager [phone]

## 2013-01-16 LAB — BASIC METABOLIC PANEL
BUN: 26 mg/dL — ABNORMAL HIGH (ref 6–23)
Chloride: 97 mEq/L (ref 96–112)
GFR calc Af Amer: 88 mL/min — ABNORMAL LOW (ref >90)
GFR calc non Af Amer: 76 mL/min — ABNORMAL LOW (ref >90)
Potassium: 4 mEq/L (ref 3.5–5.1)

## 2013-01-16 LAB — CBC
MCHC: 34.5 g/dL (ref 30.0–36.0)
Platelets: 198 10*3/uL (ref 150–400)
RDW: 13.3 % (ref 11.5–15.5)
WBC: 14.3 10*3/uL — ABNORMAL HIGH (ref 4.0–10.5)

## 2013-01-16 NOTE — Op Note (Signed)
NAME:  NAJIR, ROOP NO.:  [id number]  MEDICAL RECORD NO.:  [id number]  LOCATION:  5N32C                        FACILITY:  [facility]  PHYSICIAN:  Dyke Brackett, M.D.    DATE OF BIRTH:  1946/03/23  DATE OF PROCEDURE:  01/15/2013 DATE OF DISCHARGE:                              OPERATIVE REPORT   PREOPERATIVE DIAGNOSIS:  Osteoarthritis, left knee.  POSTOPERATIVE DIAGNOSIS:  Osteoarthritis, left knee.  OPERATION:  Left total knee replacement, Sigma cemented mobile bearing knee, size 4 femur, tibia 12.5 mm bearing, 38 mm patella, all poly.  SURGEON:  Dyke Brackett, M.D.  ASSISTANT:  Margart Sickles, PA-C.  TOURNIQUET TIME:  65 minutes.  DESCRIPTION OF PROCEDURE:  Sterile prep and drape, exsanguination of leg, inflation of 350.  A straight skin incision medial parapatellar approach to the knee made.  We cut 10 mm with a 5-degree valgus cut of the distal femur followed by 2-4 mm below the most diseased medial compartment, stripped the medial side to correct the varus deformity. Tibial cut was accomplished followed by extension gap being measured at 12.5 mm.  The size of the femur was size 4.  With intramedullary guide, we placed the appropriate balancing jig flexion creating a flexion gap of 12.5 mm with appropriate external rotation.  We then placed the anterior-posterior chamfer cut guide on this and cleanup cut was made posteriorly as well as resection of menisci released the PCL and equalization of the flexion gap at 12.5 and extension gap at 12.5.  Keel hole was cut for the tibia.  Tibial base plate was placed.  Box cut for the femur and then, trials were carried out on the femur and tibia. Full extension was noted.  Excellent balance.  Correction of the varus was noted as well.  Patella was cut for all-poly patella, size 38 leaving about 16-17 mm of native patella.  All trials were again tested with 0 degrees extension, 120 degrees flexion, good balance, no  tendency for bearing spin out.  Trial components were removed.  The bony surfaces irrigated.  We inserted the final components of tibia followed by femur and patella. We used a trial bearing.  We allowed the cement to get hardened.  Once the cement was hardened, excess cement was removed.  We checked for posterior cement.  We also released the tourniquet block prior to placing the final bearing.  No excessive bleeding was noted. Hemovac drain was placed exiting superolaterally.  Closure was affected with #1 Ethibond, 2-0 Vicryl, and skin clips.  Adequate sterile dressing and blocks were applied and taken to recovery in stable condition.     Dyke Brackett, M.D.     WDC/MEDQ  D:  01/15/2013  T:  01/16/2013  Job:  [id number]

## 2013-01-16 NOTE — Progress Notes (Signed)
SPORTS MEDICINE AND JOINT REPLACEMENT  Georgena Spurling, MD   Altamese Cabal, PA-C [address] [place], [place], [place]  [address]                             [phone]   PROGRESS NOTE  Subjective:  negative for Chest Pain  negative for Shortness of Breath  negative for Nausea/Vomiting   negative for Calf Pain  negative for Bowel Movement   Tolerating Diet: yes         Patient reports pain as 5 on 0-10 scale.    Objective: Vital signs in last 24 hours:   Patient Vitals for the past 24 hrs:  BP Temp Temp src Pulse Resp SpO2  01/16/13 0613 143/43 mmHg 98.3 F (36.8 C) Oral 69  18  97 %  01/16/13 0400 - - - - 18  97 %  01/16/13 0000 - - - - 18  97 %  01/15/13 2135 117/64 mmHg 99 F (37.2 C) Oral 78  18  97 %  01/15/13 2000 - - - - 18  97 %  01/15/13 1900 145/82 mmHg 98.2 F (36.8 C) - 93  20  96 %  01/15/13 1552 157/72 mmHg 98.5 F (36.9 C) - 80  16  -  01/15/13 1445 144/73 mmHg 97.2 F (36.2 C) - 77  11  99 %  01/15/13 1430 145/62 mmHg - - 78  15  95 %  01/15/13 1415 148/65 mmHg - - 73  14  99 %  01/15/13 1400 121/59 mmHg - - 66  11  97 %  01/15/13 1345 106/64 mmHg - - 64  13  98 %  01/15/13 1330 132/72 mmHg - - 67  11  98 %  01/15/13 1315 134/49 mmHg - - 68  17  99 %  01/15/13 1300 127/53 mmHg 98 F (36.7 C) - 72  13  97 %    @flow {1959:LAST@   Intake/Output from previous day:   01/17 0701 - 01/18 0700 In: 3180 [P.O.:480; I.V.:2700] Out: 3750 [Urine:3200; Drains:550]   Intake/Output this shift:       Intake/Output      01/17 0701 - 01/18 0700 01/18 0701 - 01/19 0700   P.O. 480    I.V. 2700    Total Intake 3180    Urine 3200    Drains 550    Total Output 3750    Net -570            LABORATORY DATA:  Basename 01/16/13 0640 01/15/13 1643  WBC 14.3* 13.5*  HGB 11.7* 13.2  HCT 33.9* 38.6*  PLT 198 181    Basename 01/16/13 0640 01/15/13 1643  NA 135 --  K 4.0 --  CL 97 --  CO2 23 --  BUN 26* --  CREATININE 1.00 0.93  GLUCOSE 115* --    CALCIUM 8.9 --   Lab Results  Component Value Date   INR 1.06 01/08/2013   INR 1.02 11/02/2012   INR 1.06 11/13/2009    Examination:  General appearance: alert, cooperative, appears stated age and no distress Extremities: Homans sign is negative, no sign of DVT  Wound Exam: clean, dry, intact   Drainage:  None: wound tissue dry  Motor Exam: EHL and FHL Intact  Sensory Exam: Deep Peroneal normal  Vascular Exam:    Assessment:    1 Day Post-Op  Procedure(s) (LRB): TOTAL KNEE ARTHROPLASTY (Left)  ADDITIONAL DIAGNOSIS:  Active Problems:  * No active hospital problems. *   Acute Blood Loss Anemia   Plan: Physical Therapy as ordered Weight Bearing as Tolerated (WBAT)  DVT Prophylaxis:  Lovenox  DISCHARGE PLAN: Skilled Nursing Facility/Rehab  DISCHARGE NEEDS: HHPT, HHRN, CPM, Walker and 3-in-1 comode seat         Xavier Munger 01/16/2013, 9:35 AM

## 2013-01-16 NOTE — Clinical Social Work Note (Addendum)
Clinical Social Work Department CLINICAL SOCIAL WORK PLACEMENT NOTE 01/16/2013  Patient:  Dean Fry, Dean Fry  Account Number:  [id number] Admit date:  01/15/2013  Clinical Social Worker:  Dellie Burns, Theresia Majors  Date/time:  01/16/2013 01:00 PM  Clinical Social Work is seeking post-discharge placement for this patient at the following level of care:   SKILLED NURSING   (*CSW will update this form in Epic as items are completed)   01/16/2013  Patient/family provided with [facility] Department of Clinical Social Work's list of facilities offering this level of care within the geographic area requested by the patient (or if unable, by the patient's family).  01/16/2013  Patient/family informed of their freedom to choose among providers that offer the needed level of care, that participate in Medicare, Medicaid or managed care program needed by the patient, have an available bed and are willing to accept the patient.  01/16/2013  Patient/family informed of [facility]' ownership interest in [facility], as well as of the fact that they are under no obligation to receive care at this facility.  PASARR submitted to EDS on 01/16/2013 PASARR number received from EDS on 01/16/2013  FL2 transmitted to all facilities in geographic area requested by pt/family on  01/16/2013 FL2 transmitted to all facilities within larger geographic area on   Patient informed that his/her managed care company has contracts with or will negotiate with  certain facilities, including the following:     Patient/family informed of bed offers received:  01/16/2013  Patient chooses bed at [facility] Physician recommends and patient chooses bed at  [facility]  Patient to be transferred to  on  01/20/12  Patient to be transferred to facility by [facility] The following physician request were entered in Epic:   Additional Comments: FL2 faxed to [facility] only as pt pre-reg with them.

## 2013-01-16 NOTE — Progress Notes (Signed)
OT Note  Patient Details Name: Dean Fry MRN: [id number] DOB: August 13, 1946   Cancelled Treatment:    Reason Eval/Treat Not Completed: Other (comment) (defer OT to SNF) If OT eval needed for admission, please reorder. Thank you.  [facility] Keylan Costabile, OTR/L  [phone] 01/16/2013 01/16/2013, 1:24 PM

## 2013-01-16 NOTE — Progress Notes (Signed)
Physical Therapy Treatment Patient Details Name: Dean Fry MRN: [id number] DOB: 03/20/1946 Today's Date: 01/16/2013 Time: 4098-1191 PT Time Calculation (min): 41 min  PT Assessment / Plan / Recommendation Comments on Treatment Session  Patient progressing well this morning. Able to ambulate with Min A for safety. Planning to DC to [facility] when ready. Conintue with current POC    Follow Up Recommendations  SNF     Does the patient have the potential to tolerate intense rehabilitation     Barriers to Discharge        Equipment Recommendations       Recommendations for Other Services    Frequency 7X/week   Plan Discharge plan remains appropriate;Frequency remains appropriate    Precautions / Restrictions Precautions Precautions: Knee Required Braces or Orthoses: Knee Immobilizer - Left Knee Immobilizer - Left: On when out of bed or walking Restrictions LLE Weight Bearing: Weight bearing as tolerated   Pertinent Vitals/Pain     Mobility  Bed Mobility Supine to Sit: 4: Min assist;HOB elevated Sitting - Scoot to Edge of Bed: 5: Supervision Details for Bed Mobility Assistance: A for LLE out of bed. Cues for positioning Transfers Sit to Stand: 4: Min guard;From bed;With upper extremity assist Stand to Sit: 4: Min guard;With armrests;To chair/3-in-1 Details for Transfer Assistance: Cues for safe technique. Patient wanting to place hands on RW to stand instead of pushing up from surface Ambulation/Gait Ambulation/Gait Assistance: 4: Min assist Ambulation Distance (Feet): 80 Feet Assistive device: Rolling walker Ambulation/Gait Assistance Details: Cues for gait sequence, RW management, and posture.  Gait Pattern: Step-to pattern;Decreased step length - right;Decreased step length - left Gait velocity: decreased    Exercises Total Joint Exercises Quad Sets: AROM;Left;10 reps Heel Slides: AAROM;Left;10 reps   PT Diagnosis:    PT Problem List:   PT Treatment  Interventions:     PT Goals Acute Rehab PT Goals PT Goal: Supine/Side to Sit - Progress: Progressing toward goal PT Goal: Sit to Stand - Progress: Progressing toward goal PT Goal: Ambulate - Progress: Progressing toward goal PT Goal: Perform Home Exercise Program - Progress: Progressing toward goal  Visit Information  Last PT Received On: 01/16/13 Assistance Needed: +1    Subjective Data      Cognition  Overall Cognitive Status: Appears within functional limits for tasks assessed/performed Arousal/Alertness: Awake/alert Orientation Level: Appears intact for tasks assessed Behavior During Session: [facility] for tasks performed    Balance     End of Session PT - End of Session Equipment Utilized During Treatment: Gait belt;Left knee immobilizer Activity Tolerance: Patient tolerated treatment well Patient left: in chair;with call bell/phone within reach Nurse Communication: Mobility status   GP     Fredrich Birks 01/16/2013, 10:21 AM 01/16/2013 Fredrich Birks PTA [phone] pager [phone] office

## 2013-01-16 NOTE — Clinical Social Work Psychosocial (Signed)
Clinical Social Work Department BRIEF PSYCHOSOCIAL ASSESSMENT 01/16/2013  Patient:  Dean Fry, Dean Fry     Account Number:  [id number]     Admit date:  01/15/2013  Clinical Social Worker:  Skip Mayer  Date/Time:  01/16/2013 01:00 PM  Referred by:  Physician  Date Referred:  01/16/2013 Referred for  SNF Placement   Other Referral:   Interview type:  Patient Other interview type:    PSYCHOSOCIAL DATA Living Status:  ALONE Admitted from facility:   Level of care:   Primary support name:  Chuck Primary support relationship to patient:  FAMILY Degree of support available:   Adequate, per pt    CURRENT CONCERNS Current Concerns  Post-Acute Placement   Other Concerns:    SOCIAL WORK ASSESSMENT / PLAN CSW consulted for SNF.  CSW confirmed with Jasmine December at [place] that pt is pre-registered.  Weekend CSW to complete FL2 and initiate [employer].  Weekday CSW to f/u with [facility] for auth # and assist with d/c to [facility].  FL2 on chart for MD signature.   Assessment/plan status:  Information/Referral to [facility] Other assessment/ plan:   Information/referral to community resources:   SNF  PTAR    PATIENT'S/FAMILY'S RESPONSE TO PLAN OF CARE: Pt reports agreeable to STR at [facility] in order to increase strength and independence with mobility prior to return home.  Pt verbalized understanding of d/c plan and appreciation for CSW assist.        Dellie Burns, MSW, LCSWA [phone] (Weekends 8:00am-4:30pm)

## 2013-01-17 LAB — TYPE AND SCREEN
ABO/RH(D): A POS
Antibody Screen: POSITIVE
DAT, IgG: NEGATIVE
Donor AG Type: NEGATIVE
Donor AG Type: NEGATIVE
Unit division: 0
Unit division: 0

## 2013-01-17 LAB — CBC
HCT: 34.6 % — ABNORMAL LOW (ref 39.0–52.0)
Hemoglobin: 11.7 g/dL — ABNORMAL LOW (ref 13.0–17.0)
MCHC: 33.8 g/dL (ref 30.0–36.0)
RDW: 13.7 % (ref 11.5–15.5)
WBC: 11.8 10*3/uL — ABNORMAL HIGH (ref 4.0–10.5)

## 2013-01-17 NOTE — Progress Notes (Signed)
SPORTS MEDICINE AND JOINT REPLACEMENT  Georgena Spurling, MD   Altamese Cabal, PA-C [address] [place], [place], [place]  [address]                             [phone]   PROGRESS NOTE  Subjective:  negative for Chest Pain  negative for Shortness of Breath  negative for Nausea/Vomiting   negative for Calf Pain  negative for Bowel Movement   Tolerating Diet: yes         Patient reports pain as 6 on 0-10 scale.    Objective: Vital signs in last 24 hours:   Patient Vitals for the past 24 hrs:  BP Temp Temp src Pulse Resp SpO2  01/17/13 0539 137/74 mmHg 99 F (37.2 C) - 81  18  95 %  01/16/13 2257 142/67 mmHg 99 F (37.2 C) - 91  18  99 %  01/16/13 1300 135/66 mmHg 98.6 F (37 C) Oral 85  18  99 %    @flow {1959:LAST@   Intake/Output from previous day:   01/18 0701 - 01/19 0700 In: 600 [P.O.:600] Out: 800 [Urine:800]   Intake/Output this shift:   01/19 0701 - 01/19 1900 In: 480 [P.O.:480] Out: 375 [Urine:375]   Intake/Output      01/18 0701 - 01/19 0700 01/19 0701 - 01/20 0700   P.O. 600 480   I.V.     Total Intake 600 480   Urine 800 375   Drains     Total Output 800 375   Net -200 +105           LABORATORY DATA:  Basename 01/17/13 0450 01/16/13 0640 01/15/13 1643  WBC 11.8* 14.3* 13.5*  HGB 11.7* 11.7* 13.2  HCT 34.6* 33.9* 38.6*  PLT 203 198 181    Basename 01/16/13 0640 01/15/13 1643  NA 135 --  K 4.0 --  CL 97 --  CO2 23 --  BUN 26* --  CREATININE 1.00 0.93  GLUCOSE 115* --  CALCIUM 8.9 --   Lab Results  Component Value Date   INR 1.06 01/08/2013   INR 1.02 11/02/2012   INR 1.06 11/13/2009    Examination:  General appearance: alert, cooperative and no distress Extremities: Homans sign is negative, no sign of DVT  Wound Exam: clean, dry, intact   Drainage:  None: wound tissue dry  Motor Exam: EHL and FHL Intact  Sensory Exam: Deep Peroneal normal  Vascular Exam:    Assessment:    2 Days Post-Op  Procedure(s) (LRB): TOTAL  KNEE ARTHROPLASTY (Left)  ADDITIONAL DIAGNOSIS:  Active Problems:  * No active hospital problems. *   Acute Blood Loss Anemia   Plan: Physical Therapy as ordered Weight Bearing as Tolerated (WBAT)  DVT Prophylaxis:  Lovenox  DISCHARGE PLAN: Skilled Nursing Facility/Rehab  DISCHARGE NEEDS: HHPT, CPM, Walker and 3-in-1 comode seat         Lauraann Missey 01/17/2013, 9:16 AM

## 2013-01-17 NOTE — Progress Notes (Signed)
Physical Therapy Treatment Patient Details Name: Dean Fry MRN: [id number] DOB: [birth year] Today's Date: 01/17/2013 Time: 4098-1191 PT Time Calculation (min): 41 min  PT Assessment / Plan / Recommendation Comments on Treatment Session  Pt continues to be very motivated.  Able to incorporate mobilty cues and education into movement patterns and sustain performance throughout session.  Still moving slow.  Educated pt on role of exercises to acheive goal of stable knee for walking and role of KI in meantime.  Plan for SNF noted, still appropriate.      Follow Up Recommendations  SNF     Does the patient have the potential to tolerate intense rehabilitation     Barriers to Discharge        Equipment Recommendations  None recommended by PT    Recommendations for Other Services    Frequency 7X/week   Plan Discharge plan remains appropriate;Frequency remains appropriate    Precautions / Restrictions Precautions Precautions: Knee Precaution Comments: reinforced reason for using KI for ambulation and need for adequate knee strength prior to d/c Required Braces or Orthoses: Knee Immobilizer - Left Knee Immobilizer - Left: On when out of bed or walking Restrictions Weight Bearing Restrictions: Yes LUE Weight Bearing: Weight bearing as tolerated LLE Weight Bearing: Weight bearing as tolerated Other Position/Activity Restrictions: do not place pillow under bend in operated knee   Pertinent Vitals/Pain "Stiff and sore" -- premedicated     Mobility  Bed Mobility Bed Mobility: Supine to Sit;Sitting - Scoot to Edge of Bed Supine to Sit: 4: Min assist;HOB elevated Sitting - Scoot to [place] of Bed: 5: Supervision Details for Bed Mobility Assistance: Demonstrational cues for self-assist L leg to L edge of bed, including use KI straps or hook R ankle under L.  Pt raises HOB >45 degrees.  Reports easier to move when self-supporting leg Transfers Transfers: Sit to Stand;Stand to  Sit Sit to Stand: 4: Min guard;From bed;With upper extremity assist Stand to Sit: 4: Min guard;With upper extremity assist;With armrests;To chair/3-in-1 Details for Transfer Assistance: Cues for safest hand positioning, including at least one hand on support surface when transitioning, kick out operated leg prior to sit, and stand in place once up in case lightheaded. Ambulation/Gait Ambulation/Gait Assistance: 4: Min guard Ambulation Distance (Feet): 115 Feet Assistive device: Rolling walker Ambulation/Gait Assistance Details: Quickly progressed to supervision with VC for step-to pattern, required min guard assist for step-through pattern, cues to activate left quad in stance phase and to minimize forward flexion compensatory movement when swinging R leg forward. Slow, see gait speed. Gait Pattern: Step-to pattern;Step-through pattern;Antalgic;Trunk flexed Gait velocity: 0.4 feet/sec.  (Normal speed is 3.3 feet/sec.) General Gait Details: stiff, slow, decreased load bearing due to pain/discomfort, but able to vary gait pattern to begin return to normal gait.  Heavily dependent on RW at this time. Stairs: No Wheelchair Mobility Wheelchair Mobility: No    Exercises Total Joint Exercises Ankle Circles/Pumps: AROM;Both;10 reps;Seated Quad Sets: AROM;Left;10 reps Gluteal Sets: AROM;5 reps;Both;Supine;Seated Heel Slides: AAROM;Left;10 reps;Supine Straight Leg Raises: AAROM;Left;5 reps;Supine;Limitations Straight Leg Raises Limitations: unable to maintain adequate quad activation to prevent knee flexion, unable to clear heel from bed without mod to max assistance   PT Diagnosis:    PT Problem List:   PT Treatment Interventions:     PT Goals Acute Rehab PT Goals PT Goal: Supine/Side to Sit - Progress: Progressing toward goal PT Goal: Sit to Supine/Side - Progress: Progressing toward goal PT Goal: Sit to Stand - Progress: Progressing toward  goal PT Goal: Ambulate - Progress: Progressing  toward goal PT Goal: Perform Home Exercise Program - Progress: Progressing toward goal  Visit Information  Last PT Received On: 01/17/13 Assistance Needed: +1    Subjective Data  Subjective: I bet I'm gonna be sore later Patient Stated Goal: play golf in [place] in April   Cognition  Overall Cognitive Status: Appears within functional limits for tasks assessed/performed Arousal/Alertness: Awake/alert Orientation Level: Appears intact for tasks assessed Behavior During Session: [facility] for tasks performed    Balance     End of Session PT - End of Session Equipment Utilized During Treatment: Gait belt;Left knee immobilizer Activity Tolerance: Patient tolerated treatment well Patient left: in chair;with call bell/phone within reach Nurse Communication: Mobility status;Patient requests pain meds (asked when next dose of mm relaxer due) CPM Left Knee CPM Left Knee: Off   GP     Narda [facility] 01/17/2013, 10:30 AM

## 2013-01-18 ENCOUNTER — Encounter (HOSPITAL_COMMUNITY): Payer: Self-pay | Admitting: Orthopedic Surgery

## 2013-01-18 LAB — CBC
Hemoglobin: 11.6 g/dL — ABNORMAL LOW (ref 13.0–17.0)
MCH: 28.6 pg (ref 26.0–34.0)
MCV: 85.7 fL (ref 78.0–100.0)
Platelets: 223 10*3/uL (ref 150–400)
RBC: 4.06 MIL/uL — ABNORMAL LOW (ref 4.22–5.81)
WBC: 11.7 10*3/uL — ABNORMAL HIGH (ref 4.0–10.5)

## 2013-01-18 MED ORDER — PANTOPRAZOLE SODIUM 40 MG PO TBEC
40.0000 mg | DELAYED_RELEASE_TABLET | Freq: Every day | ORAL | Status: DC
  Administered 2013-01-18 – 2013-01-19 (×2): 40 mg via ORAL
  Filled 2013-01-18 (×3): qty 1

## 2013-01-18 NOTE — Progress Notes (Signed)
Patient was complaining of feeling light headed when standing and a tightness in his chest. Rhythm strip has not changes. MD notified ortho static blood pressures were ordered and to stop bloop pressure medication lisinopril. BP were laying 107/54 sitting 124/83 standing 89/54. Patient stated that his tightness has gone away but not being light headed. Will continue to monitor. No other complaints at this time.

## 2013-01-18 NOTE — Progress Notes (Signed)
CARE MANAGEMENT NOTE 01/18/2013  Patient:  Dean Fry, Dean Fry   Account Number:  [id number]  Date Initiated:  01/18/2013  Documentation initiated by:  Vance Peper  Subjective/Objective Assessment:   67 yr old male s/p Left total knee arthroplasty     Action/Plan:   Patient will be going to [facility] for shortterm rehab. Social Worker is aware.   Anticipated DC Date:  01/18/2013   Anticipated DC Plan:  SKILLED NURSING FACILITY  In-house referral  Clinical Social Worker      DC Planning Services  CM consult      [facility] Choice  NA   Choice offered to / List presented to:             Status of service:  Completed, signed off Medicare Important Message given?   (If response is "NO", the following Medicare IM given date fields will be blank) Date Medicare IM given:   Date Additional Medicare IM given:    Discharge Disposition:  SKILLED NURSING FACILITY  Per UR Regulation:    If discussed at Long Length of Stay Meetings, dates discussed:    Comments:

## 2013-01-18 NOTE — Progress Notes (Signed)
Occupational Therapy Evaluation Patient Details Name: Dean Fry MRN: [id number] DOB: [birth year] Today's Date: 01/18/2013 Time: 4098-1191 OT Time Calculation (min): 17 min  OT Assessment / Plan / Recommendation Clinical Impression  67 yo s/p LTKA who PTA lived alone and was independent with all ADL and functional mobility for ADL. Pt presents with decreased independence with ADL and mobility and will benefit from rehab at SNF to return to PLOF. All further OT will be addressed at SNF.    OT Assessment  All further OT needs can be met in the next venue of care    Follow Up Recommendations  SNF    Barriers to Discharge  decreased caregiver support    Equipment Recommendations  None recommended by OT    Recommendations for Other Services  none  Frequency    eval only   Precautions / Restrictions Precautions Precautions: Knee Precaution Booklet Issued: Yes (comment) Required Braces or Orthoses: Knee Immobilizer - Left Knee Immobilizer - Right: On when out of bed or walking Restrictions LUE Weight Bearing: Weight bearing as tolerated LLE Weight Bearing: Weight bearing as tolerated   Pertinent Vitals/Pain No request for pain meds.      ADL  Eating/Feeding: Independent Where Assessed - Eating/Feeding: Chair Grooming: Set up Where Assessed - Grooming: Unsupported sitting Upper Body Bathing: Set up;Supervision/safety Where Assessed - Upper Body Bathing: Unsupported sitting Lower Body Bathing: Moderate assistance Where Assessed - Lower Body Bathing: Supported sit to stand Upper Body Dressing: Supervision/safety;Set up Where Assessed - Upper Body Dressing: Unsupported sitting Lower Body Dressing: Moderate assistance Where Assessed - Lower Body Dressing: Supported sit to Pharmacist, hospital: Minimal Dentist Method: Sit to stand;Stand pivot Acupuncturist: Materials engineer and Hygiene: Min guard Where  Assessed - Engineer, mining and Hygiene: Standing Tub/Shower Transfer: Moderate assistance Tub/Shower Transfer Method: Stand pivot Tub/Shower Transfer Equipment: Walk in shower Equipment Used: Gait belt;Rolling walker Transfers/Ambulation Related to ADLs: min A sit - stand. Min A with backing up with RW. cues for sequence ADL Comments: Pt limited with LB ADL. Pt will benefit from AE     OT Diagnosis: Generalized weakness;Acute pain  OT Problem List: Decreased strength;Decreased range of motion;Decreased activity tolerance;Decreased safety awareness;Decreased knowledge of use of DME or AE;Decreased knowledge of precautions;Obesity;Pain;Increased edema OT Treatment Interventions:     OT Goals Acute Rehab OT Goals OT Goal Formulation:  (eval only)  Visit Information  Last OT Received On: 01/18/13 Assistance Needed: +1    Subjective Data   I didn't want to burden my friends and know I will need help   Prior Functioning     Home Living Lives With: Alone Available Help at Discharge: Skilled Nursing Facility Prior Function Level of Independence: Independent Able to Take Stairs?: Yes Driving: Yes Vocation: Agricultural consultant work Musician: HOH Dominant Hand: Right         Vision/Perception  [employer]   Cognition  Overall Cognitive Status: Appears within functional limits for tasks assessed/performed Arousal/Alertness: Awake/alert Orientation Level: Appears intact for tasks assessed Behavior During Session: [facility] for tasks performed    Extremity/Trunk Assessment Right Upper Extremity Assessment RUE ROM/Strength/Tone: [facility] for tasks assessed Left Upper Extremity Assessment LUE ROM/Strength/Tone: WFL for tasks assessed Right Lower Extremity Assessment RLE ROM/Strength/Tone: [facility] for tasks assessed Left Lower Extremity Assessment LLE ROM/Strength/Tone: Deficits (TKA) Trunk Assessment Trunk Assessment: Normal     Mobility Bed Mobility Bed Mobility:  Not assessed Supine to Sit: 4: Min assist;HOB flat;With rails Sitting -  Scoot to [place] of Bed: 5: Supervision Details for Bed Mobility Assistance: A to hook RLE over LLE to self assist. Transfers Transfers: Sit to Stand;Stand to Sit Sit to Stand: With upper extremity assist;From chair/3-in-1;4: Min assist Stand to Sit: With upper extremity assist;To chair/3-in-1;4: Min assist (a to scoot LLE out from chair) Details for Transfer Assistance: vc for safe technique. vc for technique for backing up     Shoulder Instructions     Exercise    Balance Balance Balance Assessed: Yes (Min guard during ADL)   End of Session OT - End of Session Equipment Utilized During Treatment: Gait belt;Left knee immobilizer Activity Tolerance: Patient tolerated treatment well Patient left: in chair;with call bell/phone within reach Nurse Communication: Mobility status  GO     Dean Fry,HILLARY 01/18/2013, 5:07 PM [facility], OTR/L  [phone] 01/18/2013

## 2013-01-18 NOTE — Anesthesia Postprocedure Evaluation (Signed)
  Anesthesia Post-op Note  Patient: Dean Fry  Procedure(s) Performed: Procedure(s) (LRB) with comments: TOTAL KNEE ARTHROPLASTY (Left) - left total knee arthroplasty  Patient Location: Nursing Unit  Anesthesia Type:General  Level of Consciousness: awake, alert , oriented and patient cooperative  Airway and Oxygen Therapy: Patient Spontanous Breathing  Post-op Pain: mild  Post-op Assessment: Post-op Vital signs reviewed, Patient's Cardiovascular Status Stable, Respiratory Function Stable, Patent Airway, No signs of Nausea or vomiting, Adequate PO intake and Pain level controlled  Post-op Vital Signs: Reviewed and stable  Complications: No apparent anesthesia complications

## 2013-01-18 NOTE — Progress Notes (Signed)
Subjective: 3 Days Post-Op Procedure(s) (LRB): TOTAL KNEE ARTHROPLASTY (Left) Patient reports pain as mild.    Objective: Vital signs in last 24 hours: Temp:  [98.1 F (36.7 C)-98.7 F (37.1 C)] 98.7 F (37.1 C) (01/20 0641) Pulse Rate:  [75-80] 78  (01/20 0641) Resp:  [18] 18  (01/20 0641) BP: (117-142)/(44-69) 117/44 mmHg (01/20 0641) SpO2:  [96 %-97 %] 97 % (01/20 0641)  Intake/Output from previous day: 01/19 0701 - 01/20 0700 In: 960 [P.O.:960] Out: 1425 [Urine:1425] Intake/Output this shift:     Basename 01/17/13 0450 01/16/13 0640 01/15/13 1643  HGB 11.7* 11.7* 13.2    Basename 01/17/13 0450 01/16/13 0640  WBC 11.8* 14.3*  RBC 4.04* 4.00*  HCT 34.6* 33.9*  PLT 203 198    Basename 01/16/13 0640 01/15/13 1643  NA 135 --  K 4.0 --  CL 97 --  CO2 23 --  BUN 26* --  CREATININE 1.00 0.93  GLUCOSE 115* --  CALCIUM 8.9 --   No results found for this basename: LABPT:2,INR:2 in the last 72 hours  Neurovascular intact Sensation intact distally Intact pulses distally Dorsiflexion/Plantar flexion intact Incision: dressing C/D/I  Assessment/Plan: 3 Days Post-Op Procedure(s) (LRB): TOTAL KNEE ARTHROPLASTY (Left) Up with therapy Discharge to SNF Dressing change prior to d/c lovenox teaching  Margart Sickles 01/18/2013, 8:36 AM

## 2013-01-18 NOTE — Progress Notes (Signed)
01/18/2013 Fredrich Birks PTA [phone] pager [phone] office

## 2013-01-18 NOTE — Progress Notes (Signed)
Awaiting blue Medicare Authorization. Dean Fry, MSW, [phone]

## 2013-01-18 NOTE — Progress Notes (Signed)
Physical Therapy Treatment Patient Details Name: Dean Fry MRN: [id number] DOB: 01-11-1946 Today's Date: 01/18/2013 Time: 4098-1191 PT Time Calculation (min): 58 min  PT Assessment / Plan / Recommendation Comments on Treatment Session  Pt appears to demonstrate cues and instruction from previous therapy session. He was able to recall exercises and gait sequence. Pt continues to move slow due to stiffness. Awaiting SNF placement. Continue per POC.    Follow Up Recommendations        Does the patient have the potential to tolerate intense rehabilitation     Barriers to Discharge        Equipment Recommendations  None recommended by PT    Recommendations for Other Services    Frequency 7X/week   Plan Discharge plan remains appropriate;Frequency remains appropriate    Precautions / Restrictions Precautions Precautions: Knee Required Braces or Orthoses: Knee Immobilizer - Left Knee Immobilizer - Right: On when out of bed or walking Restrictions LUE Weight Bearing: Weight bearing as tolerated   Pertinent Vitals/Pain no apparent distress     Mobility  Bed Mobility Supine to Sit: 4: Min assist;HOB flat;With rails Sitting - Scoot to Edge of Bed: 5: Supervision Details for Bed Mobility Assistance: A to hook RLE over LLE to self assist. Transfers Sit to Stand: With upper extremity assist;From bed;4: Min guard Stand to Sit: 4: Min guard;With upper extremity assist;To chair/3-in-1 Ambulation/Gait Ambulation/Gait Assistance: 4: Min guard Ambulation Distance (Feet): 115 Feet Assistive device: Rolling walker Ambulation/Gait Assistance Details: Pt requires frequent standing rest breaks throughout ambulation. Able to amb same distance with less time although continues to have a decreased gait velocity. Pt realizes that he is using UE too much and attempts to WB more through LEs. Gait Pattern: Step-through pattern Gait velocity: decreased Stairs: No    Exercises Total Joint  Exercises Ankle Circles/Pumps: AROM;Both;15 reps;Supine Quad Sets: AROM;Both;15 reps;Supine Heel Slides: AAROM;Left;10 reps Hip ABduction/ADduction: AAROM;Left;10 reps   PT Diagnosis:    PT Problem List:   PT Treatment Interventions:     PT Goals Acute Rehab PT Goals PT Goal: Supine/Side to Sit - Progress: Progressing toward goal PT Goal: Sit to Stand - Progress: Progressing toward goal PT Goal: Ambulate - Progress: Progressing toward goal PT Goal: Perform Home Exercise Program - Progress: Progressing toward goal  Visit Information  Last PT Received On: 01/18/13 Assistance Needed: +1    Subjective Data      Cognition  Overall Cognitive Status: Appears within functional limits for tasks assessed/performed Arousal/Alertness: Awake/alert Orientation Level: Appears intact for tasks assessed Behavior During Session: [facility] for tasks performed    Balance     End of Session PT - End of Session Equipment Utilized During Treatment: Gait belt;Left knee immobilizer Activity Tolerance: Patient tolerated treatment well Patient left: in chair;with call bell/phone within reach   GP     Lazaro Arms 01/18/2013, 2:23 PM

## 2013-01-19 NOTE — Progress Notes (Signed)
Seen and agreed 11/29/2013 [name], [name] PTA [phone] pager [phone] office    

## 2013-01-19 NOTE — Progress Notes (Signed)
Patient refusing to wear knee immobilizer when walking to the bathroom overnight.  Patient states, "I just want to go to the bathroom!"  Education about importance of knee immobilizer offered.  Patient still refusing, staff present upon transfers from bed to bathroom and back to bed.  Will continue to encourage use of immobilizer.

## 2013-01-19 NOTE — Progress Notes (Signed)
Subjective: 4 Days Post-Op Procedure(s) (LRB): TOTAL KNEE ARTHROPLASTY (Left) Patient reports pain as mild.   +flatus, no BM  Objective: Vital signs in last 24 hours: Temp:  [98.4 F (36.9 C)-98.9 F (37.2 C)] 98.9 F (37.2 C) (01/21 0646) Pulse Rate:  [77-85] 77  (01/21 0646) Resp:  [18-19] 18  (01/21 0646) BP: (89-134)/(46-82) 131/57 mmHg (01/21 0646) SpO2:  [94 %-96 %] 95 % (01/21 0646)  Intake/Output from previous day: 01/20 0701 - 01/21 0700 In: 240 [P.O.:240] Out: 1250 [Urine:1250] Intake/Output this shift: Total I/O In: -  Out: 200 [Urine:200]   Basename 01/18/13 0744 01/17/13 0450  HGB 11.6* 11.7*    Basename 01/18/13 0744 01/17/13 0450  WBC 11.7* 11.8*  RBC 4.06* 4.04*  HCT 34.8* 34.6*  PLT 223 203   No results found for this basename: NA:2,K:2,CL:2,CO2:2,BUN:2,CREATININE:2,GLUCOSE:2,CALCIUM:2 in the last 72 hours No results found for this basename: LABPT:2,INR:2 in the last 72 hours  Neurovascular intact Dorsiflexion/Plantar flexion intact Incision: dressing C/D/I  Assessment/Plan: 4 Days Post-Op Procedure(s) (LRB): TOTAL KNEE ARTHROPLASTY (Left) Discharge to SNF Give prn meds for constipation  Simisola Sandles 01/19/2013, 9:11 AM

## 2013-01-19 NOTE — Progress Notes (Signed)
Physical Therapy Treatment Patient Details Name: Dean Fry MRN: [id number] DOB: February 05, 1946 Today's Date: 01/19/2013 Time: 1105-1200 PT Time Calculation (min): 55 min  PT Assessment / Plan / Recommendation Comments on Treatment Session  Pt continues to require increased time for amb and to complete exercises due to stiffness. Increased independence with home exercise program as pt is doing ankle pumps and quad sets throughout the day. Will continue to need therapy to increase functional independence to return to prior level of functioning.    Follow Up Recommendations  SNF     Does the patient have the potential to tolerate intense rehabilitation     Barriers to Discharge        Equipment Recommendations  None recommended by PT    Recommendations for Other Services    Frequency 7X/week   Plan Discharge plan remains appropriate;Frequency remains appropriate    Precautions / Restrictions Precautions Precautions: Knee Required Braces or Orthoses: Knee Immobilizer - Left Knee Immobilizer - Left: On when out of bed or walking Restrictions LUE Weight Bearing: Weight bearing as tolerated   Pertinent Vitals/Pain no apparent distress     Mobility  Bed Mobility Supine to Sit: 5: Supervision;With rails;HOB elevated Sitting - Scoot to Edge of Bed: 5: Supervision Details for Bed Mobility Assistance: Pt now able to self assist to lower LE to floor and up onto bed. Requires increased time. Transfers Sit to Stand: From bed;With upper extremity assist;4: Min guard Stand to Sit: 4: Min guard;With upper extremity assist;To bed Details for Transfer Assistance: Cueing for safe technique. Increased time. Ambulation/Gait Ambulation/Gait Assistance: 4: Min guard Assistive device: Rolling walker Ambulation/Gait Assistance Details: Pt required increased VCs to flex hip during amb this session. Continues to have decreased velocity and requires increased time. Decreased reliance on  UE. Gait Pattern: Step-through pattern;Decreased hip/knee flexion - left Gait velocity: decreased due to stiffness    Exercises Total Joint Exercises Ankle Circles/Pumps:  (Pt reports completing prior to treatment) Quad Sets:  (Pt reports completing prior to treatment) Heel Slides: AAROM;Left;10 reps;Supine Hip ABduction/ADduction: AAROM;Left;10 reps;Supine Straight Leg Raises: AAROM;Left;10 reps;Supine   PT Diagnosis:    PT Problem List:   PT Treatment Interventions:     PT Goals Acute Rehab PT Goals PT Goal: Supine/Side to Sit - Progress: Progressing toward goal PT Goal: Sit to Supine/Side - Progress: Progressing toward goal PT Goal: Sit to Stand - Progress: Progressing toward goal PT Goal: Ambulate - Progress: Progressing toward goal PT Goal: Perform Home Exercise Program - Progress: Progressing toward goal  Visit Information  Last PT Received On: 01/19/13 Assistance Needed: +1    Subjective Data      Cognition  Overall Cognitive Status: Appears within functional limits for tasks assessed/performed Arousal/Alertness: Awake/alert Orientation Level: Appears intact for tasks assessed Behavior During Session: [facility] for tasks performed    Balance     End of Session PT - End of Session Equipment Utilized During Treatment: Gait belt;Left knee immobilizer Activity Tolerance: Patient tolerated treatment well Patient left: in bed;with call bell/phone within reach CPM Left Knee CPM Left Knee: On Left Knee Flexion (Degrees): 60  Left Knee Extension (Degrees): 0    GP     Lazaro Arms 01/19/2013, 12:53 PM

## 2013-01-19 NOTE — Progress Notes (Signed)
Clinical social worker assisted with patient discharge to skilled nursing facility, [facility].  CSW addressed all family questions and concerns. CSW copied chart and added all important documents. CSW also set up patient transportation with [facility]. Clinical Social Worker will sign off for now as social work intervention is no longer needed.  Reka Wist, MSW, [phone] 

## 2013-01-19 NOTE — Discharge Summary (Signed)
Skilled Nursing Facility/Rehab  PATIENT ID: Dean Fry        MRN:  [id number]          DOB/AGE: 06/19/1946 / 67 y.o.    DISCHARGE SUMMARY  ADMISSION DATE:    01/15/2013 DISCHARGE DATE:   01/19/2013   ADMISSION DIAGNOSIS: OA LEFT KNEE    DISCHARGE DIAGNOSIS:  OA LEFT KNEE    ADDITIONAL DIAGNOSIS: Active Problems:  * No active hospital problems. *   Past Medical History  Diagnosis Date  . Impaired vision     glasses  . Hypercholesteremia   . Stroke 03/14/2008    pt states that he may have had a mild stroke 1 week after knee surgery  . Arthritis     left knee  . Impaired hearing   . Atrial septal defect     corrected in 1962  . Rheumatic fever     at 67 years old  . Low back pain   . Shoulder pain     left, sees chiropracter for   . Cataracts, bilateral     "early stages"  . Hypertension     Sees Dr. Dorris Fetch    PROCEDURE: Procedure(s): TOTAL KNEE ARTHROPLASTY  Left  on 01/15/2013  CONSULTS: Treatment Team:  Pamella Pert, MD   HISTORY: See H&P   HOSPITAL COURSE:  Dean Fry is a 67 y.o. admitted on 01/15/2013 and found to have a diagnosis of OA LEFT KNEE.  After appropriate laboratory studies were obtained  they were taken to the operating room on 01/15/2013 and underwent  Left Procedure(s): TOTAL KNEE ARTHROPLASTY.   They were given perioperative antibiotics:  Anti-infectives     Start     Dose/Rate Route Frequency Ordered Stop   01/15/13 1800   ceFAZolin (ANCEF) IVPB 1 g/50 mL premix        1 g 100 mL/hr over 30 Minutes Intravenous Every 6 hours 01/15/13 1549 01/16/13 0012   01/15/13 0600   ceFAZolin (ANCEF) IVPB 2 g/50 mL premix        2 g 100 mL/hr over 30 Minutes Intravenous On call to O.R. 01/14/13 1455 01/15/13 1044        .  Tolerated the procedure well.  Placed with a foley intraoperatively.  Given Ofirmev at induction and for 24 hours.    POD #1, allowed out of bed to a chair.  PT for ambulation and exercise program.   Foley D/C'd in morning.  IV saline locked.  O2 discontionued.    POD #2, continued PT and ambulation.  Hemovac pulled. Dressing changed.   The remainder of the hospital course was dedicated to ambulation and strengthening.   The patient was discharged on 4 Days Post-Op in  Stable condition.  Blood products given:  none  DIAGNOSTIC STUDIES: Recent vital signs: Patient Vitals for the past 24 hrs:  BP Temp Temp src Pulse Resp SpO2  01/19/13 0646 131/57 mmHg 98.9 F (37.2 C) - 77  18  95 %  01/18/13 2115 134/59 mmHg 98.8 F (37.1 C) Oral 79  19  94 %  01/18/13 1526 106/46 mmHg 98.4 F (36.9 C) - 77  18  96 %       Recent laboratory studies:  Basename 01/18/13 0744 02-10-13 0450 01/16/13 0640 01/15/13 1643  WBC 11.7* 11.8* 14.3* 13.5*  HGB 11.6* 11.7* 11.7* 13.2  HCT 34.8* 34.6* 33.9* 38.6*  PLT 223 203 198 181    Basename 01/16/13 0640 01/15/13 1643  NA 135 --  K 4.0 --  CL 97 --  CO2 23 --  BUN 26* --  CREATININE 1.00 0.93  GLUCOSE 115* --  CALCIUM 8.9 --   Lab Results  Component Value Date   INR 1.06 01/08/2013   INR 1.02 11/02/2012   INR 1.06 11/13/2009     Recent Radiographic Studies :  No results found.  DISCHARGE INSTRUCTIONS:   DISCHARGE MEDICATIONS:     Medication List     As of 01/19/2013  1:46 PM    STOP taking these medications         aspirin EC 81 MG tablet      lisinopril-hydrochlorothiazide 20-25 MG per tablet   Commonly known as: PRINZIDE,ZESTORETIC      TAKE these medications         atorvastatin 40 MG tablet   Commonly known as: LIPITOR   Take 40 mg by mouth at bedtime.      celecoxib 200 MG capsule   Commonly known as: CELEBREX   Take 200 mg by mouth daily as needed. For pain      enoxaparin 30 MG/0.3ML injection   Commonly known as: LOVENOX   Inject 0.3 mLs (30 mg total) into the skin every 12 (twelve) hours.      LOVAZA 1 G capsule   Generic drug: omega-3 acid ethyl esters   Take 1 g by mouth 3 (three) times daily.       methocarbamol 500 MG tablet   Commonly known as: ROBAXIN   Take 1 tablet (500 mg total) by mouth 4 (four) times daily.      oxyCODONE 5 MG immediate release tablet   Commonly known as: Oxy IR/ROXICODONE   1-2 tabs po q4-6hrs prn pain        FOLLOW UP VISIT:       Follow-up Information    Schedule an appointment as soon as possible for a visit with CAFFREY JR,W D, MD. (to be seen on 01/28/13)    Contact information:   [address] [place] [address] [phone]       Schedule an appointment as soon as possible for a visit with Pamella Pert, MD. (to be seen in 2 months)    Contact information:   [address] [place] [address] [phone]          DISPOSITION: SNF   CONDITION:  {Stable  Margart Sickles 01/19/2013, 1:46 PM

## 2013-06-01 ENCOUNTER — Other Ambulatory Visit: Payer: Self-pay | Admitting: *Deleted

## 2013-06-01 MED ORDER — LISINOPRIL-HYDROCHLOROTHIAZIDE 20-25 MG PO TABS: 1.0000 | ORAL_TABLET | Freq: Every day | ORAL | Status: DC

## 2013-06-01 MED ORDER — ATORVASTATIN CALCIUM 40 MG PO TABS: 40.0000 mg | ORAL_TABLET | Freq: Every day | ORAL | Status: DC

## 2013-06-01 MED ORDER — OMEGA-3-ACID ETHYL ESTERS 1 G PO CAPS: 1.0000 g | ORAL_CAPSULE | Freq: Three times a day (TID) | ORAL | Status: DC

## 2013-06-03 ENCOUNTER — Other Ambulatory Visit: Payer: Medicare Other

## 2013-06-03 ENCOUNTER — Other Ambulatory Visit: Payer: Self-pay | Admitting: *Deleted

## 2013-06-03 DIAGNOSIS — D62 Acute posthemorrhagic anemia: Secondary | ICD-10-CM

## 2013-06-03 DIAGNOSIS — I10 Essential (primary) hypertension: Secondary | ICD-10-CM

## 2013-06-03 DIAGNOSIS — E785 Hyperlipidemia, unspecified: Secondary | ICD-10-CM

## 2013-06-03 DIAGNOSIS — R739 Hyperglycemia, unspecified: Secondary | ICD-10-CM

## 2013-06-04 LAB — COMPREHENSIVE METABOLIC PANEL
ALT: 14 IU/L (ref 0–44)
AST: 16 IU/L (ref 0–40)
Albumin/Globulin Ratio: 1.4 (ref 1.1–2.5)
Albumin: 4.1 g/dL (ref 3.6–4.8)
Alkaline Phosphatase: 80 IU/L (ref 39–117)
BUN/Creatinine Ratio: 19 (ref 10–22)
BUN: 20 mg/dL (ref 8–27)
CO2: 24 mmol/L (ref 19–28)
Calcium: 9.5 mg/dL (ref 8.6–10.2)
Chloride: 98 mmol/L (ref 97–108)
Creatinine, Ser: 1.04 mg/dL (ref 0.76–1.27)
GFR calc Af Amer: 85 mL/min/{1.73_m2} (ref >59)
GFR calc non Af Amer: 74 mL/min/{1.73_m2} (ref >59)
Globulin, Total: 3 g/dL (ref 1.5–4.5)
Glucose: 84 mg/dL (ref 65–99)
Potassium: 4.6 mmol/L (ref 3.5–5.2)
Sodium: 140 mmol/L (ref 134–144)
Total Bilirubin: 0.5 mg/dL (ref 0.0–1.2)
Total Protein: 7.1 g/dL (ref 6.0–8.5)

## 2013-06-04 LAB — CBC WITH DIFFERENTIAL/PLATELET
Basophils Absolute: 0 10*3/uL (ref 0.0–0.2)
Basos: 1 % (ref 0–3)
Eos: 2 % (ref 0–5)
Eosinophils Absolute: 0.1 10*3/uL (ref 0.0–0.4)
HCT: 39.7 % (ref 37.5–51.0)
Hemoglobin: 13.1 g/dL (ref 12.6–17.7)
Immature Grans (Abs): 0 10*3/uL (ref 0.0–0.1)
Immature Granulocytes: 0 % (ref 0–2)
Lymphocytes Absolute: 1.9 10*3/uL (ref 0.7–3.1)
Lymphs: 29 % (ref 14–46)
MCH: 27.3 pg (ref 26.6–33.0)
MCHC: 33 g/dL (ref 31.5–35.7)
MCV: 83 fL (ref 79–97)
Monocytes Absolute: 0.4 10*3/uL (ref 0.1–0.9)
Monocytes: 7 % (ref 4–12)
Neutrophils Absolute: 4.1 10*3/uL (ref 1.4–7.0)
Neutrophils Relative %: 61 % (ref 40–74)
RBC: 4.79 x10E6/uL (ref 4.14–5.80)
RDW: 16.3 % — ABNORMAL HIGH (ref 12.3–15.4)
WBC: 6.6 10*3/uL (ref 3.4–10.8)

## 2013-06-04 LAB — LIPID PANEL
Chol/HDL Ratio: 4.1 ratio units (ref 0.0–5.0)
Cholesterol, Total: 143 mg/dL (ref 100–199)
HDL: 35 mg/dL — ABNORMAL LOW (ref >39)
LDL Calculated: 82 mg/dL (ref 0–99)
Triglycerides: 129 mg/dL (ref 0–149)
VLDL Cholesterol Cal: 26 mg/dL (ref 5–40)

## 2013-06-04 LAB — HEMOGLOBIN A1C
Est. average glucose Bld gHb Est-mCnc: 120 mg/dL
Hgb A1c MFr Bld: 5.8 % — ABNORMAL HIGH (ref 4.8–5.6)

## 2013-06-07 ENCOUNTER — Ambulatory Visit (INDEPENDENT_AMBULATORY_CARE_PROVIDER_SITE_OTHER): Payer: Medicare Other | Admitting: Internal Medicine

## 2013-06-07 ENCOUNTER — Encounter: Payer: Self-pay | Admitting: Internal Medicine

## 2013-06-07 VITALS — BP 122/76 | HR 67 | Temp 98.6°F | Resp 18 | Ht 71.0 in | Wt 257.0 lb

## 2013-06-07 DIAGNOSIS — E7800 Pure hypercholesterolemia, unspecified: Secondary | ICD-10-CM

## 2013-06-07 DIAGNOSIS — E669 Obesity, unspecified: Secondary | ICD-10-CM | POA: Insufficient documentation

## 2013-06-07 DIAGNOSIS — IMO0002 Reserved for concepts with insufficient information to code with codable children: Secondary | ICD-10-CM

## 2013-06-07 DIAGNOSIS — I10 Essential (primary) hypertension: Secondary | ICD-10-CM

## 2013-06-07 DIAGNOSIS — M1710 Unilateral primary osteoarthritis, unspecified knee: Secondary | ICD-10-CM

## 2013-06-07 DIAGNOSIS — M170 Bilateral primary osteoarthritis of knee: Secondary | ICD-10-CM | POA: Insufficient documentation

## 2013-06-07 NOTE — Assessment & Plan Note (Signed)
Has had both knees replaced.  Is working on increasing his exercise tolerance.

## 2013-06-07 NOTE — Assessment & Plan Note (Signed)
Lipids are at goal.  Is also working on diet.  Given a 2000 calorie diabetic diet today.

## 2013-06-07 NOTE — Patient Instructions (Addendum)
2000 Calorie Diabetic Diet The 2000 calorie diabetic diet is designed for eating up to 2000 calories each day. Following this diet and making healthy meal choices can help improve overall health. It controls blood glucose (sugar) levels. It can also lower blood pressure and cholesterol. SERVING SIZES Measuring foods and serving sizes helps to make sure you are getting the right amount of food. The list below tells how big or small some common serving sizes are.  1 oz.........4 stacked dice.  3 oz.........Deck of cards.  1 tsp........Tip of little finger.  1 tbs........Thumb.  2 tbs........Golf ball.   cup.......Half of a fist.  1 cup........A fist. GUIDELINES FOR CHOOSING FOODS The goal of this diet is to eat a variety of foods and limit calories to 2000 each day. This can be done by choosing foods that are low in calories and fat. The diet also suggests eating small amounts of food often. Doing this helps control your blood glucose levels so they do not get too high or too low. Each meal or snack should contain a protein food source to help you feel more satisfied and to stabilize your blood glucose. Try to eat about the same amount of food around the same time each day. This includes weekend days, travel days, and days off work. Space your meals about 4 to 5 hours apart and add a snack between them if you wish. For example, a daily food plan could include breakfast, a morning snack, lunch, dinner, and an evening snack. Healthy meals and snacks include whole grains, vegetables, fruits, lean meats, poultry, fish, and dairy products. As you plan your meals, choose a variety of foods. Choose from the bread and starches, vegetables, fruit, dairy, and meat/protein groups. Examples of foods from each group are listed below with their suggested serving sizes. Use measuring cups and spoons to become familiar with what a healthy portion looks like. Bread and Starches Each serving equals 15 grams of  carbohydrates.  1 slice bread.   bagel.   cup or 1 cup cold cereal (unsweetened).   cup hot cereal or mashed potatoes.  1 small potato (size of a computer mouse).   cup cooked pasta or rice.   English muffin.  1 cup broth-based soup.  3 cups popcorn.  4 to 6 whole-wheat crackers.   cup cooked beans, peas, or corn. Vegetables Each serving equals 5 grams of carbohydrates.   cup cooked vegetables.  1 cup raw vegetables.   cup tomato juice. Fruit Each serving equals 15 grams of carbohydrates.  1 small apple, banana, or orange.  1  cup watermelon or strawberries.   cup applesauce (no sugar added).  2 tbs raisins.   banana.   cup unsweetened canned fruit.   cup unsweetened fruit juice. Dairy Each serving equals 12 to 15 grams of carbohydrates.  1 cup fat-free milk.  6 oz artificially sweetened yogurt.  1 cup buttermilk.  1 cup soy milk. Meat/Protein  1 large egg.  2 to 3 oz meat, poultry, or fish.   cup cottage cheese.  1 tbs peanut butter.   cup tofu.  1 oz cheese.   cup tuna canned in water. SAMPLE 2000 CALORIE DIET PLAN Breakfast  1 English muffin (2 carb servings).  Reduced fat cream cheese, 1 tbs.  1 scrambled egg.   grapefruit (1 carb serving).  Fat-free milk, 1 cup (1 carb serving). Morning Snack  Artificially sweetened yogurt, 6 oz (1 carb serving).  2 tbs chopped nuts.  1   small peach (1 carb serving). Lunch  Grilled chicken sandwich.  1 hamburger bun (2 carb servings).  2 oz chicken breast.  1 lettuce leaf.  2 slices tomato.  Reduced fat mayonnaise, 1 tbs.  Carrot sticks, 1 cup.  Celery, 1 cup.  1 small apple (1 carb serving).  Fat-free milk, 1 cup (1 carb serving). Afternoon Snack   cup low-fat cottage cheese.  1  cups strawberries (1 carb serving). Dinner  Steak fajitas.  2 oz lean steak.  1 whole-wheat tortilla, 8 inches (1  carb servings).  Shredded lettuce,   cup.  2 slices tomato.  Salsa,  cup.  Low-fat sour cream, 2 tbs.  Brown rice,  cup (1 carb serving).  1 small orange (1 carb serving). Evening Snack  4 reduced fat whole-wheat crackers (1 carb serving).  1 tbs peanut butter.  12 to 15 grapes (1 carb serving). MEAL PLAN Use this worksheet to help you make a daily meal plan based on the 2000 calorie diabetic diet suggestions. The total amount of carbohydrates in your meal or snack is more important than making sure you include all of the food groups at every meal or snack. If you are using this plan to help you control your blood glucose, you may interchange carbohydrate containing foods (dairy, starches, and fruits). Choose a variety of fresh foods of varying colors and flavors. You can choose from the following foods to build your day's meals:  11 Starches.  4 Vegetables.  3 Fruits.  3 Dairy.  8 oz Meat.  Up to 6 Fats. Your dietician can use this worksheet to help you decide how many servings and what types of foods are right for you. BREAKFAST Food Group and Servings / Food Choice Starches ___________________________________________ Dairy ______________________________________________ Fruit ______________________________________________ Meat ______________________________________________ Fat________________________________________________ LUNCH Food Group and Servings / Food Choice Starch _____________________________________________ Meat ______________________________________________ Vegetables _________________________________________ Fruit ______________________________________________ Dairy______________________________________________ Fat________________________________________________ AFTERNOON SNACK Food Group and Servings / Food  Choice Starch________________________________________________ Meat_________________________________________________ Fruit__________________________________________________ DINNER Food Group and Servings / Food Choice Starches ____________________________________________ Meat _______________________________________________ Dairy _______________________________________________ Vegetables __________________________________________ Fruit ________________________________________________ Fat_________________________________________________ EVENING SNACK Food Group and Servings / Food Choice Fruit _______________________________________________ Meat _______________________________________________ Starch ______________________________________________ DAILY TOTALS Starches ________________________ Vegetables ______________________ Fruit ___________________________ Dairy ___________________________ Meat ___________________________ Fat _____________________________ Document Released: 07/08/2005 Document Revised: 03/09/2012 Document Reviewed: 07/24/2009 [employer] Patient Information 2014 [employer].  

## 2013-06-07 NOTE — Progress Notes (Signed)
Patient ID: Dean Fry, male   DOB: 1946-11-18, 67 y.o.   MRN: [id number] Code Status: full;  Is unsure if he has an up-to-date living will;  Niece is HCPOA--says he needs to do those things  No Known Allergies  Chief Complaint  Patient presents with  . Medical Managment of Chronic Issues    no new problems, wants to lose weight    HPI: Patient is a 67 y.o. white male seen in the office today for routine medical mgt of chronic diseases.    Says everyone is pleased with his progress walking since his knee replacement.  Is trying to get back to cycling (only able to resume riding 5 weeks ago.  Can do stationary bike for only 10-12 mins.  Goes to cardiology next.  He thinks he may need a pacemaker down the road if he cannot get his stamina back.  No pain in knee anymore.  Right is 110, left barely reaches 90--better after massage with PT.  Still feels tight on left when rides bike.  Is getting some muscle tone.  Has been told to go every other day b/c he is too aggressive.  Weight is around the same as in March when I last saw him.  Is riding farther and harder each day.  Is happy with strength, but not stamina and wind.  Also getting pooped after playing golf.  Working 5 shifts at [employer], then playing in golf tournament in [place].  Has motivated him.  Has to play 54 in one day.    Does eat late at night on occasion, but much better than it used to be.  Says eating is his pleasure in life.  Notes portion sizes may be a problem.  Also can't cut out coca cola.  Lasted 3 wks w/o it.    Review of Systems:  Review of Systems  Constitutional: Negative for fever, chills, weight loss and malaise/fatigue.  HENT: Negative for congestion.   Eyes: Negative for blurred vision.  Respiratory: Negative for cough and shortness of breath.   Cardiovascular: Negative for chest pain, palpitations and leg swelling.  Gastrointestinal: Negative for heartburn, abdominal pain, diarrhea and constipation.   Genitourinary: Negative for dysuria and frequency.  Musculoskeletal: Positive for myalgias. Negative for back pain, joint pain and falls.       Myalgias due to a lot of exercise  Skin: Negative for rash.  Neurological: Negative for dizziness, tingling, sensory change, focal weakness, weakness and headaches.  Endo/Heme/Allergies: Does not bruise/bleed easily.  Psychiatric/Behavioral: Negative for depression and memory loss. The patient does not have insomnia.     Past Medical History  Diagnosis Date  . Impaired vision     glasses  . Hypercholesteremia   . Stroke 03/14/2008    pt states that he may have had a mild stroke 1 week after knee surgery  . Osteoarthritis of both knees     left knee  . Impaired hearing   . Atrial septal defect     corrected in 1962  . Rheumatic fever     at 67 years old  . Low back pain   . Shoulder pain     left, sees chiropracter for   . Cataracts, bilateral     "early stages"  . Hypertension     Sees Dr. Dorris Fetch   Past Surgical History  Procedure Laterality Date  . Asd repair    . Total knee arthroplasty      right knee  .  Knee surgery      4 surgeries on right knee  . Appendectomy    . Plantar fascititis      bilateral surgery   . Asd repair    . Tonsillectomy      and removal of adnoids  . Total knee arthroplasty  01/15/2013    Procedure: TOTAL KNEE ARTHROPLASTY;  Surgeon: Thera Flake., MD;  Location: [facility] OR;  Service: Orthopedics;  Laterality: Left;  left total knee arthroplasty   Social History:   reports that he has never smoked. He does not have any smokeless tobacco history on file. He reports that he drinks about 1.0 ounces of alcohol per week. He reports that he does not use illicit drugs.  Family History  Problem Relation Age of Onset  . Adopted: Yes    Medications: Patient's Medications  New Prescriptions   No medications on file  Previous Medications   ATORVASTATIN (LIPITOR) 40 MG TABLET    Take 1 tablet (40 mg  total) by mouth at bedtime.   CELECOXIB (CELEBREX) 200 MG CAPSULE    Take 200 mg by mouth daily as needed. For pain   LISINOPRIL-HYDROCHLOROTHIAZIDE (PRINZIDE,ZESTORETIC) 20-25 MG PER TABLET    Take 1 tablet by mouth daily.   OMEGA-3 ACID ETHYL ESTERS (LOVAZA) 1 G CAPSULE    Take 1 capsule (1 g total) by mouth 3 (three) times daily.  Modified Medications   No medications on file  Discontinued Medications   ENOXAPARIN (LOVENOX) 30 MG/0.3ML INJECTION    Inject 0.3 mLs (30 mg total) into the skin every 12 (twelve) hours.   METHOCARBAMOL (ROBAXIN) 500 MG TABLET    Take 1 tablet (500 mg total) by mouth 4 (four) times daily.   OXYCODONE (OXY IR/ROXICODONE) 5 MG IMMEDIATE RELEASE TABLET    1-2 tabs po q4-6hrs prn pain   Physical Exam: Filed Vitals:   06/07/13 0954  BP: 122/76  Pulse: 67  Temp: 98.6 F (37 C)  TempSrc: Oral  Resp: 18  Height: 5\' 11"  (1.803 m)  Weight: 257 lb (116.574 kg)  SpO2: 96%  Physical Exam  Constitutional: He is oriented to person, place, and time. He appears well-developed and well-nourished.  Obese Caucasian male, NAD  HENT:  Head: Normocephalic and atraumatic.  Cardiovascular: Normal rate, regular rhythm and intact distal pulses.  Exam reveals no gallop and no friction rub.   No murmur heard. Pulmonary/Chest: Effort normal and breath sounds normal. No respiratory distress.  Abdominal: Soft. Bowel sounds are normal. He exhibits no distension. There is no tenderness.  Abdominal obesity  Musculoskeletal:  Left knee flexion only to 90 degrees after several mos from TKA  Neurological: He is alert and oriented to person, place, and time. No cranial nerve deficit.  Skin: Skin is warm and dry.  Psychiatric: He has a normal mood and affect.    Labs reviewed: Basic Metabolic Panel:  Recent Labs  56/21/30 1025 01/15/13 1643 01/16/13 0640 06/03/13 1045  NA 139  --  135 140  K 4.5  --  4.0 4.6  CL 100  --  97 98  CO2 28  --  23 24  GLUCOSE 92  --  115* 84   BUN 20  --  26* 20  CREATININE 0.96 0.93 1.00 1.04  CALCIUM 9.4  --  8.9 9.5   Liver Function Tests:  Recent Labs  11/02/12 1430 01/08/13 1025 06/03/13 1045  AST 16 21 16   ALT 16 21 14   ALKPHOS 64 68  80  BILITOT 0.4 0.5 0.5  PROT 7.1 7.3 7.1  ALBUMIN 3.6 3.6  --   CBC:  Recent Labs  01/16/13 0640 01/17/13 0450 01/18/13 0744 06/03/13 1045  WBC 14.3* 11.8* 11.7* 6.6  NEUTROABS  --   --   --  4.1  HGB 11.7* 11.7* 11.6* 13.1  HCT 33.9* 34.6* 34.8* 39.7  MCV 84.8 85.6 85.7 83  PLT 198 203 223  --    Lipid Panel:  Recent Labs  06/03/13 1045  HDL 35*  LDLCALC 82  TRIG 161  CHOLHDL 4.1   Assessment/Plan Hypertension BP is now at goal with current medications (ACE/HCTZ) and exercise.  Continue these as ordered.  He is exercising more lately.    Osteoarthritis of both knees Has had both knees replaced.  Is working on increasing his exercise tolerance.    Hypercholesteremia Lipids are at goal.  Is also working on diet.  Given a 2000 calorie diabetic diet today.    Obesity, unspecified Has not lost weight, but is continuing exercise and diet.     Labs/tests ordered:  CBC, BMP, hba1c, flp in 6 mos.

## 2013-06-07 NOTE — Assessment & Plan Note (Signed)
BP is now at goal with current medications (ACE/HCTZ) and exercise.  Continue these as ordered.  He is exercising more lately.

## 2013-06-07 NOTE — Assessment & Plan Note (Signed)
Has not lost weight, but is continuing exercise and diet.

## 2013-06-11 ENCOUNTER — Ambulatory Visit: Payer: Self-pay | Admitting: Internal Medicine

## 2013-12-02 ENCOUNTER — Other Ambulatory Visit: Payer: Medicare Other

## 2013-12-02 DIAGNOSIS — I10 Essential (primary) hypertension: Secondary | ICD-10-CM

## 2013-12-02 DIAGNOSIS — E669 Obesity, unspecified: Secondary | ICD-10-CM

## 2013-12-02 DIAGNOSIS — E7800 Pure hypercholesterolemia, unspecified: Secondary | ICD-10-CM

## 2013-12-03 ENCOUNTER — Encounter: Payer: Self-pay | Admitting: *Deleted

## 2013-12-03 ENCOUNTER — Encounter: Payer: Self-pay | Admitting: Internal Medicine

## 2013-12-03 LAB — CBC WITH DIFFERENTIAL/PLATELET
Basophils Absolute: 0 10*3/uL (ref 0.0–0.2)
Basos: 0 %
Eos: 2 %
Eosinophils Absolute: 0.2 10*3/uL (ref 0.0–0.4)
HCT: 40.9 % (ref 37.5–51.0)
Hemoglobin: 13.7 g/dL (ref 12.6–17.7)
Immature Grans (Abs): 0 10*3/uL (ref 0.0–0.1)
Immature Granulocytes: 0 %
Lymphocytes Absolute: 2 10*3/uL (ref 0.7–3.1)
Lymphs: 27 %
MCH: 28.1 pg (ref 26.6–33.0)
MCHC: 33.5 g/dL (ref 31.5–35.7)
MCV: 84 fL (ref 79–97)
Monocytes Absolute: 0.5 10*3/uL (ref 0.1–0.9)
Monocytes: 6 %
Neutrophils Absolute: 4.8 10*3/uL (ref 1.4–7.0)
Neutrophils Relative %: 65 %
RBC: 4.88 x10E6/uL (ref 4.14–5.80)
RDW: 14.4 % (ref 12.3–15.4)
WBC: 7.6 10*3/uL (ref 3.4–10.8)

## 2013-12-03 LAB — BASIC METABOLIC PANEL
BUN/Creatinine Ratio: 18 (ref 10–22)
BUN: 20 mg/dL (ref 8–27)
CO2: 28 mmol/L (ref 18–29)
Calcium: 9.3 mg/dL (ref 8.6–10.2)
Chloride: 97 mmol/L (ref 97–108)
Creatinine, Ser: 1.13 mg/dL (ref 0.76–1.27)
GFR calc Af Amer: 77 mL/min/{1.73_m2} (ref >59)
GFR calc non Af Amer: 67 mL/min/{1.73_m2} (ref >59)
Glucose: 90 mg/dL (ref 65–99)
Potassium: 4.7 mmol/L (ref 3.5–5.2)
Sodium: 138 mmol/L (ref 134–144)

## 2013-12-03 LAB — HEMOGLOBIN A1C
Est. average glucose Bld gHb Est-mCnc: 114 mg/dL
Hgb A1c MFr Bld: 5.6 % (ref 4.8–5.6)

## 2013-12-03 LAB — LIPID PANEL
Chol/HDL Ratio: 4 ratio units (ref 0.0–5.0)
Cholesterol, Total: 124 mg/dL (ref 100–199)
HDL: 31 mg/dL — ABNORMAL LOW (ref >39)
LDL Calculated: 73 mg/dL (ref 0–99)
Triglycerides: 102 mg/dL (ref 0–149)
VLDL Cholesterol Cal: 20 mg/dL (ref 5–40)

## 2013-12-06 ENCOUNTER — Ambulatory Visit: Payer: Self-pay | Admitting: Internal Medicine

## 2013-12-06 DIAGNOSIS — Z0289 Encounter for other administrative examinations: Secondary | ICD-10-CM

## 2013-12-09 ENCOUNTER — Encounter: Payer: Self-pay | Admitting: Internal Medicine

## 2013-12-09 ENCOUNTER — Ambulatory Visit (INDEPENDENT_AMBULATORY_CARE_PROVIDER_SITE_OTHER): Payer: Medicare Other | Admitting: Internal Medicine

## 2013-12-09 VITALS — BP 130/78 | HR 75 | Temp 97.5°F | Resp 18 | Ht 71.0 in | Wt 255.6 lb

## 2013-12-09 DIAGNOSIS — E785 Hyperlipidemia, unspecified: Secondary | ICD-10-CM

## 2013-12-09 DIAGNOSIS — R7309 Other abnormal glucose: Secondary | ICD-10-CM

## 2013-12-09 DIAGNOSIS — R739 Hyperglycemia, unspecified: Secondary | ICD-10-CM

## 2013-12-09 DIAGNOSIS — Z23 Encounter for immunization: Secondary | ICD-10-CM

## 2013-12-09 DIAGNOSIS — E669 Obesity, unspecified: Secondary | ICD-10-CM

## 2013-12-09 DIAGNOSIS — M1710 Unilateral primary osteoarthritis, unspecified knee: Secondary | ICD-10-CM

## 2013-12-09 DIAGNOSIS — M170 Bilateral primary osteoarthritis of knee: Secondary | ICD-10-CM

## 2013-12-09 DIAGNOSIS — IMO0002 Reserved for concepts with insufficient information to code with codable children: Secondary | ICD-10-CM

## 2013-12-09 DIAGNOSIS — R6889 Other general symptoms and signs: Secondary | ICD-10-CM

## 2013-12-09 NOTE — Progress Notes (Signed)
Patient ID: Dean Fry, male   DOB: 04-Jun-1946, 67 y.o.   MRN: [id number]   Location:  [facility] / [facility]  No Known Allergies  Chief Complaint  Patient presents with  . Acute Visit    sob x 2 months    HPI: Patient is a 67 y.o. white male seen in the office today for acute visit due to worsening shortness of breath for 2 months.  He notes this is worse when riding his bike.  He feels his exercise tolerance is diminished.  He is trying to ride his bike.  Had cut back during volleyball season at first, but for 5-6 wks, he has been trying to ride at the gym.  Was told by cardiology that he should be ok if he can get his heart rate up to 130s.  Gets quite dyspneic at 116 though.  No pains.  No palpitations.  No other difficulty with activities--golf fine, weights, but stationary bike at 116-118 bpm is dyspneic.  Was not a problem before at any heart rate that he notes.    Left knee still not as good as right.  Has decreased flexibility in left knee even now.  Seems like iliotibial band is tight.    Review of Systems:  Review of Systems  Constitutional: Negative for fever, chills and malaise/fatigue.  Eyes: Negative for blurred vision.  Respiratory: Positive for shortness of breath. Negative for cough.        Dyspnea on exertion (see hpi)  Cardiovascular: Negative for chest pain, palpitations, orthopnea, claudication, leg swelling and PND.  Gastrointestinal: Negative for abdominal pain.  Genitourinary: Negative for dysuria.  Musculoskeletal: Negative for falls and joint pain.       Left thigh tightness, knee still not as flexible  Neurological: Negative for dizziness, weakness and headaches.  Endo/Heme/Allergies: Does not bruise/bleed easily.  Psychiatric/Behavioral: Negative for depression and memory loss.     Past Medical History  Diagnosis Date  . Impaired vision     glasses  . Hypercholesteremia   . Stroke 03/14/2008    pt states that he  may have had a mild stroke 1 week after knee surgery  . Osteoarthritis of both knees     left knee  . Impaired hearing   . Atrial septal defect     corrected in 1962  . Rheumatic fever     at 67 years old  . Low back pain   . Shoulder pain     left, sees chiropracter for   . Cataracts, bilateral     "early stages"  . Hypertension     Sees Dr. Dorris Fetch    Past Surgical History  Procedure Laterality Date  . Asd repair    . Total knee arthroplasty  2009    right knee, Dr.Randal   . Knee surgery      4 surgeries on right knee  . Appendectomy  1979  . Plantar fascititis      bilateral surgery   . Asd repair    . Tonsillectomy  1954    and removal of adnoids  . Total knee arthroplasty  01/15/2013    Procedure: TOTAL KNEE ARTHROPLASTY;  Surgeon: Thera Flake., MD;  Location: [facility] OR;  Service: Orthopedics;  Laterality: Left;  left total knee arthroplasty    Social History:   reports that he has never smoked. He does not have any smokeless tobacco history on file. He reports that he drinks about  1.0 ounces of alcohol per week. He reports that he does not use illicit drugs.  Family History  Problem Relation Age of Onset  . Adopted: Yes  . Heart attack Father   . Pancreatic cancer Brother     Medications: Patient's Medications  New Prescriptions   No medications on file  Previous Medications   ASPIRIN 81 MG TABLET    Take 81 mg by mouth daily.   ATORVASTATIN (LIPITOR) 40 MG TABLET    Take 1 tablet (40 mg total) by mouth at bedtime.   CELECOXIB (CELEBREX) 200 MG CAPSULE    Take 200 mg by mouth daily as needed. For pain   LISINOPRIL-HYDROCHLOROTHIAZIDE (PRINZIDE,ZESTORETIC) 20-25 MG PER TABLET    Take 1 tablet by mouth daily.   OMEGA-3 ACID ETHYL ESTERS (LOVAZA) 1 G CAPSULE    Take 1 capsule (1 g total) by mouth 3 (three) times daily.  Modified Medications   No medications on file  Discontinued Medications   No medications on file   Physical Exam: Filed Vitals:    12/09/13 0740  BP: 130/78  Pulse: 75  Temp: 97.5 F (36.4 C)  TempSrc: Oral  Resp: 18  Height: 5\' 11"  (1.803 m)  Weight: 255 lb 9.6 oz (115.939 kg)  SpO2: 98%  Physical Exam  Constitutional: He is oriented to person, place, and time. He appears well-developed and well-nourished. No distress.  Obese white male  HENT:  Head: Normocephalic and atraumatic.  Cardiovascular: Normal rate, regular rhythm, normal heart sounds and intact distal pulses.   Pulmonary/Chest: Breath sounds normal. No respiratory distress.  Effort decreased  Abdominal: Soft. Bowel sounds are normal.  Musculoskeletal: He exhibits no tenderness.  Left knee with decreased ROM vs. Right, tight over iliotibial band  Neurological: He is alert and oriented to person, place, and time.  Skin: Skin is warm and dry.    Labs reviewed: Basic Metabolic Panel:  Recent Labs  96/29/52 0640 06/03/13 1045 12/02/13 0820  NA 135 140 138  K 4.0 4.6 4.7  CL 97 98 97  CO2 23 24 28   GLUCOSE 115* 84 90  BUN 26* 20 20  CREATININE 1.00 1.04 1.13  CALCIUM 8.9 9.5 9.3   Liver Function Tests:  Recent Labs  01/08/13 1025 06/03/13 1045  AST 21 16  ALT 21 14  ALKPHOS 68 80  BILITOT 0.5 0.5  PROT 7.3 7.1  ALBUMIN 3.6  --    No results found for this basename: LIPASE, AMYLASE,  in the last 8760 hours No results found for this basename: AMMONIA,  in the last 8760 hours CBC:  Recent Labs  01/16/13 0640 01/17/13 0450 01/18/13 0744 06/03/13 1045 12/02/13 0820  WBC 14.3* 11.8* 11.7* 6.6 7.6  NEUTROABS  --   --   --  4.1 4.8  HGB 11.7* 11.7* 11.6* 13.1 13.7  HCT 33.9* 34.6* 34.8* 39.7 40.9  MCV 84.8 85.6 85.7 83 84  PLT 198 203 223  --   --    Lipid Panel:  Recent Labs  06/03/13 1045 12/02/13 0820  HDL 35* 31*  LDLCALC 82 73  TRIG 129 102  CHOLHDL 4.1 4.0   Lab Results  Component Value Date   HGBA1C 5.6 12/02/2013   Past Procedures: 2 prior stress tests unremarkable with Dr. Jacinto Halim Had trigeminy during  his knee surgery per anesthesia records  Assessment/Plan 1. Hyperlipidemia LDL goal < 100 - LDL improved, but HDL remains low--is using fish oil, statin and exercising, trying to eat a  healthy diet - Comprehensive metabolic panel; Future - Lipid panel; Future  2. Decreased exercise tolerance -recently worse--see hpi -unclear if this is due simply to deconditioning vs. coronary artery disease -advised he should see Dr. Jacinto Halim again b/c he is having trouble riding his stationary bike above the HR of 116 or so and he had told him to return if he had difficulty working out to a HR of 130 - CBC with Differential; Future--last cbc wnl so anemia is not explanation  3. Osteoarthritis of both knees -has had replacements of both -iliotibial band tender on left with slight decreased ROM on that side since left TKA -advised to continue his exercise regimen, but don't overdo it due to #2  4. Obesity, unspecified -has lost 1.5lbs since last visit--is disappointed with this, but exercise tolerance is interfering and is not always adherent with diet - Lipid panel; Future - Hemoglobin A1c; Future  5. Hyperglycemia - cont diet, exercise -Comprehensive metabolic panel; Future - Hemoglobin A1c; Future  6.  Need for influenza vaccine -given today  Labs/tests ordered:   Orders Placed This Encounter  Procedures  . CBC with Differential    Standing Status: Future     Number of Occurrences:      Standing Expiration Date: 08/09/2014  . Comprehensive metabolic panel    Standing Status: Future     Number of Occurrences:      Standing Expiration Date: 08/09/2014  . Lipid panel    Standing Status: Future     Number of Occurrences:      Standing Expiration Date: 08/09/2014  . Hemoglobin A1c    Standing Status: Future     Number of Occurrences:      Standing Expiration Date: 08/09/2014    Next appt:  4 mos, labs before

## 2014-04-07 ENCOUNTER — Other Ambulatory Visit: Payer: Medicare Other

## 2014-04-08 ENCOUNTER — Other Ambulatory Visit: Payer: Medicare Other

## 2014-04-11 ENCOUNTER — Ambulatory Visit: Payer: Medicare Other | Admitting: Internal Medicine

## 2014-04-18 ENCOUNTER — Other Ambulatory Visit: Payer: Medicare Other

## 2014-04-18 DIAGNOSIS — E785 Hyperlipidemia, unspecified: Secondary | ICD-10-CM

## 2014-04-18 DIAGNOSIS — R6889 Other general symptoms and signs: Secondary | ICD-10-CM

## 2014-04-18 DIAGNOSIS — E669 Obesity, unspecified: Secondary | ICD-10-CM

## 2014-04-18 DIAGNOSIS — R739 Hyperglycemia, unspecified: Secondary | ICD-10-CM

## 2014-04-19 LAB — CBC WITH DIFFERENTIAL/PLATELET
Basophils Absolute: 0 10*3/uL (ref 0.0–0.2)
Basos: 0 %
Eos: 2 %
Eosinophils Absolute: 0.2 10*3/uL (ref 0.0–0.4)
HCT: 42.8 % (ref 37.5–51.0)
Hemoglobin: 13.9 g/dL (ref 12.6–17.7)
Immature Grans (Abs): 0 10*3/uL (ref 0.0–0.1)
Immature Granulocytes: 0 %
Lymphocytes Absolute: 2 10*3/uL (ref 0.7–3.1)
Lymphs: 29 %
MCH: 28.3 pg (ref 26.6–33.0)
MCHC: 32.5 g/dL (ref 31.5–35.7)
MCV: 87 fL (ref 79–97)
Monocytes Absolute: 0.5 10*3/uL (ref 0.1–0.9)
Monocytes: 7 %
Neutrophils Absolute: 4.4 10*3/uL (ref 1.4–7.0)
Neutrophils Relative %: 62 %
RBC: 4.91 x10E6/uL (ref 4.14–5.80)
RDW: 14.5 % (ref 12.3–15.4)
WBC: 7.1 10*3/uL (ref 3.4–10.8)

## 2014-04-19 LAB — COMPREHENSIVE METABOLIC PANEL
ALT: 18 IU/L (ref 0–44)
AST: 21 IU/L (ref 0–40)
Albumin/Globulin Ratio: 1.8 (ref 1.1–2.5)
Albumin: 4.6 g/dL (ref 3.6–4.8)
Alkaline Phosphatase: 79 IU/L (ref 39–117)
BUN/Creatinine Ratio: 20 (ref 10–22)
BUN: 21 mg/dL (ref 8–27)
CO2: 27 mmol/L (ref 18–29)
Calcium: 9.5 mg/dL (ref 8.6–10.2)
Chloride: 99 mmol/L (ref 97–108)
Creatinine, Ser: 1.05 mg/dL (ref 0.76–1.27)
GFR calc Af Amer: 84 mL/min/{1.73_m2} (ref >59)
GFR calc non Af Amer: 73 mL/min/{1.73_m2} (ref >59)
Globulin, Total: 2.5 g/dL (ref 1.5–4.5)
Glucose: 83 mg/dL (ref 65–99)
Potassium: 4.7 mmol/L (ref 3.5–5.2)
Sodium: 141 mmol/L (ref 134–144)
Total Bilirubin: 0.6 mg/dL (ref 0.0–1.2)
Total Protein: 7.1 g/dL (ref 6.0–8.5)

## 2014-04-19 LAB — LIPID PANEL
Chol/HDL Ratio: 4.7 ratio units (ref 0.0–5.0)
Cholesterol, Total: 145 mg/dL (ref 100–199)
HDL: 31 mg/dL — ABNORMAL LOW (ref >39)
LDL Calculated: 82 mg/dL (ref 0–99)
Triglycerides: 158 mg/dL — ABNORMAL HIGH (ref 0–149)
VLDL Cholesterol Cal: 32 mg/dL (ref 5–40)

## 2014-04-19 LAB — HEMOGLOBIN A1C
Est. average glucose Bld gHb Est-mCnc: 117 mg/dL
Hgb A1c MFr Bld: 5.7 % — ABNORMAL HIGH (ref 4.8–5.6)

## 2014-04-25 ENCOUNTER — Encounter: Payer: Self-pay | Admitting: Internal Medicine

## 2014-04-25 ENCOUNTER — Ambulatory Visit (INDEPENDENT_AMBULATORY_CARE_PROVIDER_SITE_OTHER): Payer: Medicare Other | Admitting: Internal Medicine

## 2014-04-25 VITALS — BP 144/76 | HR 54 | Temp 97.8°F | Wt 258.4 lb

## 2014-04-25 DIAGNOSIS — E785 Hyperlipidemia, unspecified: Secondary | ICD-10-CM

## 2014-04-25 DIAGNOSIS — I10 Essential (primary) hypertension: Secondary | ICD-10-CM | POA: Insufficient documentation

## 2014-04-25 DIAGNOSIS — IMO0002 Reserved for concepts with insufficient information to code with codable children: Secondary | ICD-10-CM

## 2014-04-25 DIAGNOSIS — E669 Obesity, unspecified: Secondary | ICD-10-CM

## 2014-04-25 DIAGNOSIS — M1710 Unilateral primary osteoarthritis, unspecified knee: Secondary | ICD-10-CM

## 2014-04-25 DIAGNOSIS — M170 Bilateral primary osteoarthritis of knee: Secondary | ICD-10-CM

## 2014-04-25 DIAGNOSIS — R7309 Other abnormal glucose: Secondary | ICD-10-CM

## 2014-04-25 DIAGNOSIS — R739 Hyperglycemia, unspecified: Secondary | ICD-10-CM

## 2014-04-25 MED ORDER — LISINOPRIL-HYDROCHLOROTHIAZIDE 20-25 MG PO TABS: 1.0000 | ORAL_TABLET | Freq: Every day | ORAL | Status: DC

## 2014-04-25 NOTE — Progress Notes (Signed)
Patient ID: Dean Fry, male   DOB: 01-04-1946, 68 y.o.   MRN: [id number]   Location:  [facility] / [place] [facility]  Code Status: full code  No Known Allergies  Chief Complaint  Patient presents with  . Medical Management of Chronic Issues    4 month f/u & discuss labs, also Insurance form    HPI: Patient is a 68 y.o. white male seen in the office today for medical mgt of chronic diseases.  Has started getting dental work.  Eventually getting dentures.  Has less bleeding of gums.    Still doing his exercise.  Still loves to eat.  Plays golf.  Has done a lot of work on his legs.  Planning to go to [place] and walking the golf course.    Is a bit concerned about his memory today.  Forgot his glasses twice and can't drive w/o them.    BP has typically been satisfactory at prior visits--elevated today after a "buddies golf trip"--ate some fried fish and chips/fries and bad breakfast foods.    Left knee still tight after his surgery vs. Other knee.     Review of Systems:  Review of Systems  Constitutional: Negative for fever and malaise/fatigue.  Eyes: Negative for blurred vision.  Respiratory: Positive for shortness of breath.   Cardiovascular: Negative for chest pain and leg swelling.  Gastrointestinal: Negative for constipation, blood in stool and melena.  Genitourinary: Negative for dysuria, urgency and frequency.  Musculoskeletal: Negative for falls, joint pain and myalgias.       Decreased ROM of left knee vs right since tka  Skin: Negative for rash.  Neurological: Negative for headaches.  Psychiatric/Behavioral: Positive for memory loss. Negative for depression.       He feels he has memory loss    Past Medical History  Diagnosis Date  . Impaired vision     glasses  . Hypercholesteremia   . Stroke 03/14/2008    pt states that he may have had a mild stroke 1 week after knee surgery  . Osteoarthritis of both knees     left knee  .  Impaired hearing   . Atrial septal defect     corrected in 1962  . Rheumatic fever     at 68 years old  . Low back pain   . Shoulder pain     left, sees chiropracter for   . Cataracts, bilateral     "early stages"  . Hypertension     Sees Dr. Dorris Fetch    Past Surgical History  Procedure Laterality Date  . Asd repair    . Total knee arthroplasty  2009    right knee, Dr.Randal   . Knee surgery      4 surgeries on right knee  . Appendectomy  1979  . Plantar fascititis      bilateral surgery   . Asd repair    . Tonsillectomy  1954    and removal of adnoids  . Total knee arthroplasty  01/15/2013    Procedure: TOTAL KNEE ARTHROPLASTY;  Surgeon: Thera Flake., MD;  Location: [facility] OR;  Service: Orthopedics;  Laterality: Left;  left total knee arthroplasty    Social History:   reports that he has never smoked. He does not have any smokeless tobacco history on file. He reports that he drinks about one ounce of alcohol per week. He reports that he does not use illicit drugs.  Family History  Problem Relation Age of Onset  . Adopted: Yes  . Heart attack Father   . Pancreatic cancer Brother     Medications: Patient's Medications  New Prescriptions   No medications on file  Previous Medications   ASPIRIN 81 MG TABLET    Take 81 mg by mouth daily.   ATORVASTATIN (LIPITOR) 40 MG TABLET    Take 1 tablet (40 mg total) by mouth at bedtime.   CELECOXIB (CELEBREX) 200 MG CAPSULE    Take 200 mg by mouth daily as needed. For pain   LISINOPRIL-HYDROCHLOROTHIAZIDE (PRINZIDE,ZESTORETIC) 20-25 MG PER TABLET    Take 1 tablet by mouth daily.   OMEGA-3 ACID ETHYL ESTERS (LOVAZA) 1 G CAPSULE    Take 1 capsule (1 g total) by mouth 3 (three) times daily.  Modified Medications   No medications on file  Discontinued Medications   No medications on file     Physical Exam: Filed Vitals:   04/25/14 0830  BP: 144/76  Pulse: 54  Temp: 97.8 F (36.6 C)  TempSrc: Oral  Weight: 258 lb 6.4 oz  (117.209 kg)  SpO2: 97%  Physical Exam  Constitutional: He is oriented to person, place, and time. He appears well-developed and well-nourished.  Obese white male  HENT:  Head: Normocephalic and atraumatic.  Had a few teeth extracted on the bottom  Cardiovascular: Normal rate, regular rhythm, normal heart sounds and intact distal pulses.   Pulmonary/Chest: Effort normal and breath sounds normal. No respiratory distress.  Musculoskeletal: He exhibits no edema and no tenderness.  Neurological: He is alert and oriented to person, place, and time.     Labs reviewed: Basic Metabolic Panel:  Recent Labs  16/09/9605/05/14 1045 12/02/13 0820 04/18/14 0919  NA 140 138 141  K 4.6 4.7 4.7  CL 98 97 99  CO2 24 28 27   GLUCOSE 84 90 83  BUN 20 20 21   CREATININE 1.04 1.13 1.05  CALCIUM 9.5 9.3 9.5   Liver Function Tests:  Recent Labs  06/03/13 1045 04/18/14 0919  AST 16 21  ALT 14 18  ALKPHOS 80 79  BILITOT 0.5 0.6  PROT 7.1 7.1   CBC:  Recent Labs  06/03/13 1045 12/02/13 0820 04/18/14 0919  WBC 6.6 7.6 7.1  NEUTROABS 4.1 4.8 4.4  HGB 13.1 13.7 13.9  HCT 39.7 40.9 42.8  MCV 83 84 87   Lipid Panel:  Recent Labs  06/03/13 1045 12/02/13 0820 04/18/14 0919  HDL 35* 31* 31*  LDLCALC 82 73 82  TRIG 129 102 158*  CHOLHDL 4.1 4.0 4.7   Lab Results  Component Value Date   HGBA1C 5.7* 04/18/2014    Assessment/Plan  1. Hyperlipidemia LDL goal < 100 - TG and LDL up slightly from last visit, LDL still at goal, TG just above -knows it's fried foods, but has trouble cutting these down -cont fish oil, lipitor 40mg , diet, exercise - Lipid panel; Future  2. Obesity, unspecified - cont exercise and diet--given low fat and low chol diet--info given today again, f/u labs ordered - CBC With differential/Platelet; Future - Hemoglobin A1c; Future - Lipid panel; Future  3. Hyperglycemia - has been oscillating from normal to hyperglycemic--cont diet and exercise -  Comprehensive metabolic panel; Future - Hemoglobin A1c; Future  4. Unspecified essential hypertension - bp was up today after a weekend of saltier foods than he typically eats -all others have been at goal - lisinopril-hydrochlorothiazide (PRINZIDE,ZESTORETIC) 20-25 MG per tablet; Take 1 tablet by mouth daily.  Dispense: 90 tablet; Refill: 3  5. Osteoarthritis of both knees -has had both replaced -left has not done as well as right with ROM -cont exercising including riding bike and walking when golfing -continues celebrex  Labs/tests ordered: Orders Placed This Encounter  Procedures  . CBC With differential/Platelet    Standing Status: Future     Number of Occurrences:      Standing Expiration Date: 10/25/2014  . Comprehensive metabolic panel    Standing Status: Future     Number of Occurrences:      Standing Expiration Date: 10/25/2014  . Hemoglobin A1c    Standing Status: Future     Number of Occurrences:      Standing Expiration Date: 10/25/2014  . Lipid panel    Standing Status: Future     Number of Occurrences:      Standing Expiration Date: 10/25/2014    Next appt:  4 mos for annual exam with labs before and MMSE

## 2014-04-25 NOTE — Patient Instructions (Signed)
Fat and Cholesterol Control Diet  Fat and cholesterol levels in your blood and organs are influenced by your diet. High levels of fat and cholesterol may lead to diseases of the heart, small and large blood vessels, gallbladder, liver, and pancreas.  CONTROLLING FAT AND CHOLESTEROL WITH DIET  Although exercise and lifestyle factors are important, your diet is key. That is because certain foods are known to raise cholesterol and others to lower it. The goal is to balance foods for their effect on cholesterol and more importantly, to replace saturated and trans fat with other types of fat, such as monounsaturated fat, polyunsaturated fat, and omega-3 fatty acids.  On average, a person should consume no more than 15 to 17 g of saturated fat daily. Saturated and trans fats are considered "bad" fats, and they will raise LDL cholesterol. Saturated fats are primarily found in animal products such as meats, butter, and cream. However, that does not mean you need to give up all your favorite foods. Today, there are good tasting, low-fat, low-cholesterol substitutes for most of the things you like to eat. Choose low-fat or nonfat alternatives. Choose round or loin cuts of red meat. These types of cuts are lowest in fat and cholesterol. Chicken (without the skin), fish, veal, and ground turkey breast are great choices. Eliminate fatty meats, such as hot dogs and salami. Even shellfish have little or no saturated fat. Have a 3 oz (85 g) portion when you eat lean meat, poultry, or fish.  Trans fats are also called "partially hydrogenated oils." They are oils that have been scientifically manipulated so that they are solid at room temperature resulting in a longer shelf life and improved taste and texture of foods in which they are added. Trans fats are found in stick margarine, some tub margarines, cookies, crackers, and baked goods.   When baking and cooking, oils are a great substitute for butter. The monounsaturated oils are  especially beneficial since it is believed they lower LDL and raise HDL. The oils you should avoid entirely are saturated tropical oils, such as coconut and palm.   Remember to eat a lot from food groups that are naturally free of saturated and trans fat, including fish, fruit, vegetables, beans, grains (barley, rice, couscous, bulgur wheat), and pasta (without cream sauces).   IDENTIFYING FOODS THAT LOWER FAT AND CHOLESTEROL   Soluble fiber may lower your cholesterol. This type of fiber is found in fruits such as apples, vegetables such as broccoli, potatoes, and carrots, legumes such as beans, peas, and lentils, and grains such as barley. Foods fortified with plant sterols (phytosterol) may also lower cholesterol. You should eat at least 2 g per day of these foods for a cholesterol lowering effect.   Read package labels to identify low-saturated fats, trans fat free, and low-fat foods at the supermarket. Select cheeses that have only 2 to 3 g saturated fat per ounce. Use a heart-healthy tub margarine that is free of trans fats or partially hydrogenated oil. When buying baked goods (cookies, crackers), avoid partially hydrogenated oils. Breads and muffins should be made from whole grains (whole-wheat or whole oat flour, instead of "flour" or "enriched flour"). Buy non-creamy canned soups with reduced salt and no added fats.   FOOD PREPARATION TECHNIQUES   Never deep-fry. If you must fry, either stir-fry, which uses very little fat, or use non-stick cooking sprays. When possible, broil, bake, or roast meats, and steam vegetables. Instead of putting butter or margarine on vegetables, use lemon   and herbs, applesauce, and cinnamon (for squash and sweet potatoes). Use nonfat yogurt, salsa, and low-fat dressings for salads.   LOW-SATURATED FAT / LOW-FAT FOOD SUBSTITUTES  Meats / Saturated Fat (g)  · Avoid: Steak, marbled (3 oz/85 g) / 11 g  · Choose: Steak, lean (3 oz/85 g) / 4 g  · Avoid: Hamburger (3 oz/85 g) / 7  g  · Choose: Hamburger, lean (3 oz/85 g) / 5 g  · Avoid: Ham (3 oz/85 g) / 6 g  · Choose: Ham, lean cut (3 oz/85 g) / 2.4 g  · Avoid: Chicken, with skin, dark meat (3 oz/85 g) / 4 g  · Choose: Chicken, skin removed, dark meat (3 oz/85 g) / 2 g  · Avoid: Chicken, with skin, light meat (3 oz/85 g) / 2.5 g  · Choose: Chicken, skin removed, light meat (3 oz/85 g) / 1 g  Dairy / Saturated Fat (g)  · Avoid: Whole milk (1 cup) / 5 g  · Choose: Low-fat milk, 2% (1 cup) / 3 g  · Choose: Low-fat milk, 1% (1 cup) / 1.5 g  · Choose: Skim milk (1 cup) / 0.3 g  · Avoid: Hard cheese (1 oz/28 g) / 6 g  · Choose: Skim milk cheese (1 oz/28 g) / 2 to 3 g  · Avoid: Cottage cheese, 4% fat (1 cup) / 6.5 g  · Choose: Low-fat cottage cheese, 1% fat (1 cup) / 1.5 g  · Avoid: Ice cream (1 cup) / 9 g  · Choose: Sherbet (1 cup) / 2.5 g  · Choose: Nonfat frozen yogurt (1 cup) / 0.3 g  · Choose: Frozen fruit bar / trace  · Avoid: Whipped cream (1 tbs) / 3.5 g  · Choose: Nondairy whipped topping (1 tbs) / 1 g  Condiments / Saturated Fat (g)  · Avoid: Mayonnaise (1 tbs) / 2 g  · Choose: Low-fat mayonnaise (1 tbs) / 1 g  · Avoid: Butter (1 tbs) / 7 g  · Choose: Extra light margarine (1 tbs) / 1 g  · Avoid: Coconut oil (1 tbs) / 11.8 g  · Choose: Olive oil (1 tbs) / 1.8 g  · Choose: Corn oil (1 tbs) / 1.7 g  · Choose: Safflower oil (1 tbs) / 1.2 g  · Choose: Sunflower oil (1 tbs) / 1.4 g  · Choose: Soybean oil (1 tbs) / 2.4 g  · Choose: Canola oil (1 tbs) / 1 g  Document Released: 12/16/2005 Document Revised: 04/12/2013 Document Reviewed: 06/06/2011  [employer]® Patient Information ©2014 [employer].

## 2014-05-06 ENCOUNTER — Other Ambulatory Visit: Payer: Self-pay | Admitting: *Deleted

## 2014-05-06 DIAGNOSIS — I10 Essential (primary) hypertension: Secondary | ICD-10-CM

## 2014-05-06 MED ORDER — LISINOPRIL-HYDROCHLOROTHIAZIDE 20-25 MG PO TABS: 1.0000 | ORAL_TABLET | Freq: Every day | ORAL | Status: DC

## 2014-05-06 NOTE — Telephone Encounter (Signed)
[facility] 

## 2014-05-11 ENCOUNTER — Ambulatory Visit (INDEPENDENT_AMBULATORY_CARE_PROVIDER_SITE_OTHER): Payer: Medicare Other | Admitting: Nurse Practitioner

## 2014-05-11 ENCOUNTER — Encounter: Payer: Self-pay | Admitting: Nurse Practitioner

## 2014-05-11 VITALS — BP 132/70 | HR 94 | Temp 98.9°F | Wt 255.0 lb

## 2014-05-11 DIAGNOSIS — R109 Unspecified abdominal pain: Secondary | ICD-10-CM

## 2014-05-11 DIAGNOSIS — R102 Pelvic and perineal pain: Secondary | ICD-10-CM

## 2014-05-11 LAB — POCT URINALYSIS DIPSTICK
BILIRUBIN UA: NEGATIVE
Glucose, UA: NEGATIVE
KETONES UA: NEGATIVE
Nitrite, UA: NEGATIVE
PH UA: 6.5
Spec Grav, UA: 1.015
Urobilinogen, UA: 2

## 2014-05-11 NOTE — Patient Instructions (Signed)
Will send off urine for culture for possible Urinary Tract Infection Increase water intake

## 2014-05-11 NOTE — Progress Notes (Signed)
Patient ID: Dean Fry, male   DOB: 04/27/1946, 68 y.o.   MRN: [id number]    No Known Allergies  Chief Complaint  Patient presents with  . Personal Problem    Personal concern     HPI: Patient is a 68 y.o. male seen in the office today for dull pain and bloated in lower abdomen and increase fatigued for past 2-3 days. No pain with urination, no trouble urination, no increase in frequency. No changes in bowel pattern. No appetite but still eating. No abdominal discomfort with eating Dull pain that is constant and a 1/10 very minor.  Review of Systems:  Review of Systems  Constitutional: Negative for fever, chills and malaise/fatigue.       Decreased appetite  Respiratory: Negative for cough and shortness of breath.   Cardiovascular: Negative for chest pain.  Gastrointestinal: Positive for abdominal pain (reprots lower abdomen bloating). Negative for heartburn, nausea, vomiting, diarrhea, constipation and blood in stool.  Genitourinary: Negative for dysuria, urgency, frequency, hematuria and flank pain.  Musculoskeletal: Negative for myalgias.  Skin: Negative for itching and rash.  Neurological: Negative for dizziness, weakness and headaches.     Past Medical History  Diagnosis Date  . Impaired vision     glasses  . Hypercholesteremia   . Stroke 03/14/2008    pt states that he may have had a mild stroke 1 week after knee surgery  . Osteoarthritis of both knees     left knee  . Impaired hearing   . Atrial septal defect     corrected in 1962  . Rheumatic fever     at 68 years old  . Low back pain   . Shoulder pain     left, sees chiropracter for   . Cataracts, bilateral     "early stages"  . Hypertension     Sees Dr. Dorris Fetchiffany Reid   Past Surgical History  Procedure Laterality Date  . Asd repair    . Total knee arthroplasty  2009    right knee, Dr.Randal   . Knee surgery      4 surgeries on right knee  . Appendectomy  1979  . Plantar fascititis      bilateral  surgery   . Asd repair    . Tonsillectomy  1954    and removal of adnoids  . Total knee arthroplasty  01/15/2013    Procedure: TOTAL KNEE ARTHROPLASTY;  Surgeon: Thera FlakeW D Caffrey Jr., MD;  Location: [facility] OR;  Service: Orthopedics;  Laterality: Left;  left total knee arthroplasty   Social History:   reports that he has never smoked. He does not have any smokeless tobacco history on file. He reports that he drinks about one ounce of alcohol per week. He reports that he does not use illicit drugs.  Family History  Problem Relation Age of Onset  . Adopted: Yes  . Heart attack Father   . Pancreatic cancer Brother     Medications: Patient's Medications  New Prescriptions   No medications on file  Previous Medications   ASPIRIN 81 MG TABLET    Take 81 mg by mouth daily.   ATORVASTATIN (LIPITOR) 40 MG TABLET    Take 1 tablet (40 mg total) by mouth at bedtime.   CELECOXIB (CELEBREX) 200 MG CAPSULE    Take 200 mg by mouth daily as needed. For pain   LISINOPRIL-HYDROCHLOROTHIAZIDE (PRINZIDE,ZESTORETIC) 20-25 MG PER TABLET    Take 1 tablet by mouth daily.   OMEGA-3 ACID  ETHYL ESTERS (LOVAZA) 1 G CAPSULE    Take 1 capsule (1 g total) by mouth 3 (three) times daily.  Modified Medications   No medications on file  Discontinued Medications   No medications on file     Physical Exam:  Filed Vitals:   05/11/14 1507  BP: 132/70  Pulse: 94  Temp: 98.9 F (37.2 C)  TempSrc: Oral  Weight: 255 lb (115.667 kg)  SpO2: 96%    Physical Exam  Constitutional: He is oriented to person, place, and time and well-developed, well-nourished, and in no distress.  Cardiovascular: Normal rate, regular rhythm and normal heart sounds.   Pulmonary/Chest: Effort normal and breath sounds normal.  Abdominal: Soft. Bowel sounds are normal. He exhibits no distension and no mass. There is tenderness (suprapubic area). There is no rebound and no guarding.  Musculoskeletal: He exhibits no edema and no tenderness.    Neurological: He is alert and oriented to person, place, and time.  Skin: Skin is warm and dry.  Psychiatric: Affect normal.    Labs reviewed: Basic Metabolic Panel:  Recent Labs  40/98/1105/05/12 1045 12/02/13 0820 04/18/14 0919  NA 140 138 141  K 4.6 4.7 4.7  CL 98 97 99  CO2 24 28 27   GLUCOSE 84 90 83  BUN 20 20 21   CREATININE 1.04 1.13 1.05  CALCIUM 9.5 9.3 9.5   Liver Function Tests:  Recent Labs  06/03/13 1045 04/18/14 0919  AST 16 21  ALT 14 18  ALKPHOS 80 79  BILITOT 0.5 0.6  PROT 7.1 7.1   No results found for this basename: LIPASE, AMYLASE,  in the last 8760 hours No results found for this basename: AMMONIA,  in the last 8760 hours CBC:  Recent Labs  06/03/13 1045 12/02/13 0820 04/18/14 0919  WBC 6.6 7.6 7.1  NEUTROABS 4.1 4.8 4.4  HGB 13.1 13.7 13.9  HCT 39.7 40.9 42.8  MCV 83 84 87   Lipid Panel:  Recent Labs  06/03/13 1045 12/02/13 0820 04/18/14 0919  HDL 35* 31* 31*  LDLCALC 82 73 82  TRIG 129 102 158*  CHOLHDL 4.1 4.0 4.7    Assessment/Plan 1. Suprapubic pain -urine with leukocytes and trace blood- will send off for culture, pt instructed to increase water intake and notify us if symptoms get worse  - POCT urinalysis dipstick - Urine culture - CBC With differential/Platelet -will notify with lab results

## 2014-05-13 LAB — URINE CULTURE: Organism ID, Bacteria: NO GROWTH

## 2014-07-27 ENCOUNTER — Other Ambulatory Visit: Payer: Self-pay | Admitting: *Deleted

## 2014-07-27 MED ORDER — ATORVASTATIN CALCIUM 40 MG PO TABS: 40.0000 mg | ORAL_TABLET | Freq: Every day | ORAL | Status: DC

## 2014-07-27 MED ORDER — OMEGA-3-ACID ETHYL ESTERS 1 G PO CAPS: 1.0000 g | ORAL_CAPSULE | Freq: Three times a day (TID) | ORAL | Status: DC

## 2014-07-27 NOTE — Telephone Encounter (Signed)
[facility] 

## 2014-08-31 ENCOUNTER — Other Ambulatory Visit: Payer: Medicare Other

## 2014-08-31 DIAGNOSIS — R739 Hyperglycemia, unspecified: Secondary | ICD-10-CM

## 2014-08-31 DIAGNOSIS — E785 Hyperlipidemia, unspecified: Secondary | ICD-10-CM

## 2014-08-31 DIAGNOSIS — E669 Obesity, unspecified: Secondary | ICD-10-CM

## 2014-09-01 LAB — COMPREHENSIVE METABOLIC PANEL
ALT: 18 IU/L (ref 0–44)
AST: 19 IU/L (ref 0–40)
Albumin/Globulin Ratio: 1.6 (ref 1.1–2.5)
Albumin: 4.4 g/dL (ref 3.6–4.8)
Alkaline Phosphatase: 71 IU/L (ref 39–117)
BUN/Creatinine Ratio: 20 (ref 10–22)
BUN: 24 mg/dL (ref 8–27)
CO2: 24 mmol/L (ref 18–29)
Calcium: 9.5 mg/dL (ref 8.6–10.2)
Chloride: 98 mmol/L (ref 97–108)
Creatinine, Ser: 1.2 mg/dL (ref 0.76–1.27)
GFR calc Af Amer: 71 mL/min/{1.73_m2} (ref >59)
GFR calc non Af Amer: 62 mL/min/{1.73_m2} (ref >59)
Globulin, Total: 2.7 g/dL (ref 1.5–4.5)
Glucose: 92 mg/dL (ref 65–99)
Potassium: 4.2 mmol/L (ref 3.5–5.2)
Sodium: 138 mmol/L (ref 134–144)
Total Bilirubin: 0.5 mg/dL (ref 0.0–1.2)
Total Protein: 7.1 g/dL (ref 6.0–8.5)

## 2014-09-01 LAB — LIPID PANEL
Chol/HDL Ratio: 4.1 ratio units (ref 0.0–5.0)
Cholesterol, Total: 139 mg/dL (ref 100–199)
HDL: 34 mg/dL — ABNORMAL LOW (ref >39)
LDL Calculated: 79 mg/dL (ref 0–99)
Triglycerides: 128 mg/dL (ref 0–149)
VLDL Cholesterol Cal: 26 mg/dL (ref 5–40)

## 2014-09-01 LAB — CBC WITH DIFFERENTIAL
Basophils Absolute: 0 10*3/uL (ref 0.0–0.2)
Basos: 1 %
Eos: 2 %
Eosinophils Absolute: 0.1 10*3/uL (ref 0.0–0.4)
HCT: 41.9 % (ref 37.5–51.0)
Hemoglobin: 13.9 g/dL (ref 12.6–17.7)
Immature Grans (Abs): 0 10*3/uL (ref 0.0–0.1)
Immature Granulocytes: 0 %
Lymphocytes Absolute: 2.3 10*3/uL (ref 0.7–3.1)
Lymphs: 31 %
MCH: 28.8 pg (ref 26.6–33.0)
MCHC: 33.2 g/dL (ref 31.5–35.7)
MCV: 87 fL (ref 79–97)
Monocytes Absolute: 0.4 10*3/uL (ref 0.1–0.9)
Monocytes: 6 %
Neutrophils Absolute: 4.4 10*3/uL (ref 1.4–7.0)
Neutrophils Relative %: 60 %
Platelets: 222 10*3/uL (ref 150–379)
RBC: 4.83 x10E6/uL (ref 4.14–5.80)
RDW: 15 % (ref 12.3–15.4)
WBC: 7.3 10*3/uL (ref 3.4–10.8)

## 2014-09-01 LAB — HEMOGLOBIN A1C
Est. average glucose Bld gHb Est-mCnc: 117 mg/dL
Hgb A1c MFr Bld: 5.7 % — ABNORMAL HIGH (ref 4.8–5.6)

## 2014-09-02 ENCOUNTER — Encounter: Payer: Self-pay | Admitting: Internal Medicine

## 2014-09-02 ENCOUNTER — Ambulatory Visit (INDEPENDENT_AMBULATORY_CARE_PROVIDER_SITE_OTHER): Payer: Medicare Other | Admitting: Internal Medicine

## 2014-09-02 VITALS — BP 168/70 | HR 55 | Temp 97.7°F | Resp 20 | Ht 71.0 in | Wt 251.6 lb

## 2014-09-02 DIAGNOSIS — Z23 Encounter for immunization: Secondary | ICD-10-CM

## 2014-09-02 DIAGNOSIS — M170 Bilateral primary osteoarthritis of knee: Secondary | ICD-10-CM

## 2014-09-02 DIAGNOSIS — E669 Obesity, unspecified: Secondary | ICD-10-CM

## 2014-09-02 DIAGNOSIS — I10 Essential (primary) hypertension: Secondary | ICD-10-CM

## 2014-09-02 DIAGNOSIS — Z0000 Encounter for general adult medical examination without abnormal findings: Secondary | ICD-10-CM

## 2014-09-02 DIAGNOSIS — R739 Hyperglycemia, unspecified: Secondary | ICD-10-CM

## 2014-09-02 DIAGNOSIS — R7309 Other abnormal glucose: Secondary | ICD-10-CM

## 2014-09-02 DIAGNOSIS — E785 Hyperlipidemia, unspecified: Secondary | ICD-10-CM

## 2014-09-02 DIAGNOSIS — M1710 Unilateral primary osteoarthritis, unspecified knee: Secondary | ICD-10-CM

## 2014-09-02 NOTE — Progress Notes (Signed)
Patient ID: Dean Fry, male   DOB: 06-07-1946, 68 y.o.   MRN: [id number]   Location:  [facility] [place] [facility]  Code Status: full code  No Known Allergies  Chief Complaint  Patient presents with  . Annual Exam    HPI: Patient is a 68 y.o. white male seen in the office today for his annual exam.    Takes his bp meds at night.  Elevated this morning above goal.Admits to a lousy diet the past 2 wks.  Didn't eat healthy at golf tournament and again this week.  Did lose 4 lbs.    Lost his hearing aide and is hoping it's going to turn up in his apt.  Not looking forward to another one.    Feels more absent minded and forgetting stuff.  Left glasses in his house 2x in a week.  Forgot to turn the answering machine on.  This is his busiest time of year.  Volunteered 5 days and entertained over the weekend when he was at golf tournament.  Is afraid of Alzheimer's.    Is slowly getting his teeth pulled so he can get dentures.  Gets his annual eye exam.  Had big change in left eye.  Has cataracts coming in.    Also had great time walking for golf in [place].  Wants to keep doing that.    Now has been lax with riding bike in past week, too.  Review of Systems:  Review of Systems  Constitutional: Positive for weight loss. Negative for fever, chills and malaise/fatigue.  HENT: Negative for congestion.   Respiratory: Negative for cough, sputum production and shortness of breath.   Cardiovascular: Negative for chest pain and leg swelling.  Gastrointestinal: Negative for heartburn, constipation, blood in stool and melena.  Genitourinary: Negative for dysuria, urgency and frequency.       Only 1x nocturia   Musculoskeletal: Negative for falls, joint pain and myalgias.  Skin: Negative for itching and rash.  Neurological: Negative for dizziness, loss of consciousness, weakness and headaches.  Endo/Heme/Allergies: Does not bruise/bleed easily.    Psychiatric/Behavioral: Positive for memory loss. Negative for depression. The patient is not nervous/anxious.        He is concerned about his memory    Past Medical History  Diagnosis Date  . Impaired vision     glasses  . Hypercholesteremia   . Stroke 03/14/2008    pt states that he may have had a mild stroke 1 week after knee surgery  . Osteoarthritis of both knees     left knee  . Impaired hearing   . Atrial septal defect     corrected in 1962  . Rheumatic fever     at 68 years old  . Low back pain   . Shoulder pain     left, sees chiropracter for   . Cataracts, bilateral     "early stages"  . Hypertension     Sees Dr. Dorris Fetch    Past Surgical History  Procedure Laterality Date  . Asd repair    . Total knee arthroplasty  2009    right knee, Dr.Randal   . Knee surgery      4 surgeries on right knee  . Appendectomy  1979  . Plantar fascititis      bilateral surgery   . Asd repair    . Tonsillectomy  1954    and removal of adnoids  . Total knee arthroplasty  01/15/2013    Procedure: TOTAL KNEE ARTHROPLASTY;  Surgeon: Thera Flake., MD;  Location: [facility] OR;  Service: Orthopedics;  Laterality: Left;  left total knee arthroplasty    Social History:   reports that he has never smoked. He does not have any smokeless tobacco history on file. He reports that he drinks about one ounce of alcohol per week. He reports that he does not use illicit drugs.  Family History  Problem Relation Age of Onset  . Adopted: Yes  . Heart attack Father   . Pancreatic cancer Brother     Medications: Patient's Medications  New Prescriptions   No medications on file  Previous Medications   ASPIRIN 81 MG TABLET    Take 81 mg by mouth daily.   ATORVASTATIN (LIPITOR) 40 MG TABLET    Take 1 tablet (40 mg total) by mouth at bedtime.   LISINOPRIL-HYDROCHLOROTHIAZIDE (PRINZIDE,ZESTORETIC) 20-25 MG PER TABLET    Take 1 tablet by mouth daily.   OMEGA-3 ACID ETHYL ESTERS (LOVAZA) 1 G  CAPSULE    Take 1 capsule (1 g total) by mouth 3 (three) times daily.  Modified Medications   No medications on file  Discontinued Medications   CELECOXIB (CELEBREX) 200 MG CAPSULE    Take 200 mg by mouth daily as needed. For pain     Physical Exam: Filed Vitals:   09/02/14 0826  BP: 168/70  Pulse: 55  Temp: 97.7 F (36.5 C)  TempSrc: Oral  Resp: 20  Height:  (1.803 m)  Weight: 251 lb 9.6 oz (114.125 kg)  SpO2: 98%  Physical Exam  Constitutional: He is oriented to person, place, and time. He appears well-developed and well-nourished. No distress.  Obese white male  HENT:  Head: Normocephalic and atraumatic.  Right Ear: External ear normal.  Left Ear: External ear normal.  Nose: Nose normal.  Mouth/Throat: Oropharynx is clear and moist. No oropharyngeal exudate.  Eyes: Conjunctivae and EOM are normal. Pupils are equal, round, and reactive to light.  Neck: Normal range of motion. Neck supple. No JVD present. No thyromegaly present.  Cardiovascular: Normal rate, regular rhythm, normal heart sounds and intact distal pulses.   Pulmonary/Chest: Effort normal and breath sounds normal. No respiratory distress.  Abdominal: Soft. Bowel sounds are normal. He exhibits no distension and no mass. There is no tenderness. There is no rebound and no guarding. No hernia.  Musculoskeletal: Normal range of motion. He exhibits no edema and no tenderness.  Lymphadenopathy:    He has no cervical adenopathy.  Neurological: He is alert and oriented to person, place, and time.  Could not get reflexes today (bilateral TKAs)  Skin: Skin is warm and dry.  Has purplish blue nodule on his right upper lateral arm which he notes may be a bit darker--will monitor--has had for many many years;  Has several seborrheic keratoses of his back;  Has midline sternal scar and scars on bilateral knees  Psychiatric: He has a normal mood and affect. His behavior is normal. Judgment and thought content normal.      Labs reviewed: Basic Metabolic Panel:  Recent Labs  16/10/96 0820 04/18/14 0919 08/31/14 0839  NA 138 141 138  K 4.7 4.7 4.2  CL 97 99 98  CO2 GLUCOSE 90 83 92  BUN CREATININE 1.13 1.05 1.20  CALCIUM 9.3 9.5 9.5   Liver Function Tests:  Recent Labs  04/18/14 0919 08/31/14 0839  AST 21 19  ALT 18 18  ALKPHOS 79 71  BILITOT 0.6 0.5  PROT 7.1 7.1  CBC:  Recent Labs  12/02/13 0820 04/18/14 0919 08/31/14 0839  WBC 7.6 7.1 7.3  NEUTROABS 4.8 4.4 4.4  HGB 13.7 13.9 13.9  HCT 40.9 42.8 41.9  MCV 84 87 87  PLT  --   --  222   Lipid Panel:  Recent Labs  12/02/13 0820 04/18/14 0919 08/31/14 0839  HDL 31* 31* 34*  LDLCALC 73 82 79  TRIG 102 158* 128  CHOLHDL 4.0 4.7 4.1   Lab Results  Component Value Date   HGBA1C 5.7* 08/31/2014    Assessment/Plan 1. Routine general medical examination at a health care facility -annual exam performed today -has had all of his routine vaccinations except prevnar  -was given influenza today -had cscope in 2012, next due 2022 -counseled on his diet and exercise routine extensively  2. Obesity, unspecified -continue diet and exercise routine (get back to it--slack for 2 wks now)  3. Primary osteoarthritis of both knees -cont cycling and walking when golfing -pain and ROM much better now  4. Unspecified essential hypertension -bp at goal normally--high today, but attributes to salty food and not exercising properly for 2 wks  5. Hyperlipidemia LDL goal <100 -lipids are now at goal  6. Hyperglycemia -hba1c stable in prediabetic range at 5.7 -cont diet and exercise   7. Need for prophylactic vaccination and inoculation against influenza -given flu shot today  Labs/tests ordered: Orders Placed This Encounter  Procedures  . Flu Vaccine QUAD 36+ mos PF IM (Fluarix Quad PF)  . CBC With differential/Platelet    Standing Status: Future     Number of Occurrences:      Standing Expiration  Date: 05/03/2015  . Basic metabolic panel    Standing Status: Future     Number of Occurrences:      Standing Expiration Date: 05/03/2015  . Hemoglobin A1c    Standing Status: Future     Number of Occurrences:      Standing Expiration Date: 05/03/2015  . Lipid panel    Standing Status: Future     Number of Occurrences:      Standing Expiration Date: 05/03/2015  functional status is completely independent  Next appt:  4 mos with labs before; will need his prevnar at that time and MMSE (did not have hearing aide so not done today)

## 2014-10-06 ENCOUNTER — Ambulatory Visit (INDEPENDENT_AMBULATORY_CARE_PROVIDER_SITE_OTHER): Payer: Medicare Other | Admitting: Internal Medicine

## 2014-10-06 ENCOUNTER — Encounter: Payer: Self-pay | Admitting: Internal Medicine

## 2014-10-06 VITALS — BP 90/50 | HR 92 | Temp 98.2°F | Resp 20 | Ht 71.0 in | Wt 246.4 lb

## 2014-10-06 DIAGNOSIS — J0100 Acute maxillary sinusitis, unspecified: Secondary | ICD-10-CM

## 2014-10-06 DIAGNOSIS — R5383 Other fatigue: Secondary | ICD-10-CM

## 2014-10-06 NOTE — Patient Instructions (Signed)
Drink plenty of water and rest.  I suspect you have had a virus that has not caused all of its typical symptoms--likely sinus or a variation of the flu that you didn't get all of the symptoms of b/c you've been vaccinated.

## 2014-10-06 NOTE — Progress Notes (Signed)
Patient ID: Dean Fry, male   DOB: 06-18-1946, 68 y.o.   MRN: [id number]   Location:  [facility] [place] [facility]  Code Status: full code   No Known Allergies  Chief Complaint  Patient presents with  . Medical Management of Chronic Issues    feeling crappy x 2 wks, body aches, cold chills yesterday,no appetite,    HPI: Patient is a 68 y.o. white male seen in the office today for acute visit due to fatigue, chills, aches for 2 wks, worse the past few days.  Started with extreme fatigue over th weekend of all of the rain.  Couldn't ride his bike, did a little exercise.  As week progressed, started with body aches, wasn't sleeping good at night.  Last three days no appetite.  Took an ibuprofen yesterday morning which helped with the aches, but cold sweats started.  Did errands yesterday, had cold chills.  Put three layers on and laid down.  Still had chills, put on a 4th.  Took aspirin and went to bed.  Is missing golfing today.  Only eating one meal per day for 5 days.  No nausea.  No coughing, mucus, runny nose.  Feels thirsty a lot.  No diarrhea.  No blood in stool.  Not around many people lately.  Thinks he had a fever last night.  No rashes.  Took his bp med at like 1am, he says.  Normally hasn't taken the bp medicine when he comes in here.    Review of Systems:  Review of Systems  Constitutional: Positive for chills, weight loss and malaise/fatigue. Negative for fever.       5 lbs--has been eating better food, but also no appetite the past week  HENT: Negative for congestion.        Denies congestion but seems to have some  Eyes: Negative for blurred vision.  Respiratory: Negative for shortness of breath.   Cardiovascular: Negative for chest pain.  Gastrointestinal: Negative for nausea, vomiting, abdominal pain, diarrhea, blood in stool and melena.  Genitourinary: Negative for dysuria, urgency and frequency.  Musculoskeletal: Positive for myalgias.  Negative for falls and joint pain.  Neurological: Negative for dizziness, loss of consciousness and weakness.  Endo/Heme/Allergies: Does not bruise/bleed easily.  Psychiatric/Behavioral: Negative for depression.    Past Medical History  Diagnosis Date  . Impaired vision     glasses  . Hypercholesteremia   . Stroke 03/14/2008    pt states that he may have had a mild stroke 1 week after knee surgery  . Osteoarthritis of both knees     left knee  . Impaired hearing   . Atrial septal defect     corrected in 1962  . Rheumatic fever     at 68 years old  . Low back pain   . Shoulder pain     left, sees chiropracter for   . Cataracts, bilateral     "early stages"  . Hypertension     Sees Dr. Dorris Fetch    Past Surgical History  Procedure Laterality Date  . Asd repair    . Total knee arthroplasty  2009    right knee, Dr.Randal   . Knee surgery      4 surgeries on right knee  . Appendectomy  1979  . Plantar fascititis      bilateral surgery   . Asd repair    . Tonsillectomy  1954    and removal of adnoids  .  Total knee arthroplasty  01/15/2013    Procedure: TOTAL KNEE ARTHROPLASTY;  Surgeon: Thera FlakeW D Caffrey Jr., MD;  Location: [facility] OR;  Service: Orthopedics;  Laterality: Left;  left total knee arthroplasty    Social History:   reports that he has never smoked. He does not have any smokeless tobacco history on file. He reports that he drinks about one ounce of alcohol per week. He reports that he does not use illicit drugs.  Family History  Problem Relation Age of Onset  . Adopted: Yes  . Heart attack Father   . Pancreatic cancer Brother     Medications: Patient's Medications  New Prescriptions   No medications on file  Previous Medications   ASPIRIN 81 MG TABLET    Take 81 mg by mouth daily.   ATORVASTATIN (LIPITOR) 40 MG TABLET    Take 1 tablet (40 mg total) by mouth at bedtime.   LISINOPRIL-HYDROCHLOROTHIAZIDE (PRINZIDE,ZESTORETIC) 20-25 MG PER TABLET    Take 1 tablet  by mouth daily.   OMEGA-3 ACID ETHYL ESTERS (LOVAZA) 1 G CAPSULE    Take 1 capsule (1 g total) by mouth 3 (three) times daily.  Modified Medications   No medications on file  Discontinued Medications   No medications on file     Physical Exam: Filed Vitals:   10/06/14 0843  BP: 90/50  Pulse: 92  Temp: 98.2 F (36.8 C)  TempSrc: Oral  Resp: 20  Height: 5\' 11"  (1.803 m)  Weight: 246 lb 6.4 oz (111.766 kg)  SpO2: 97%  Physical Exam  Constitutional: He is oriented to person, place, and time. He appears well-developed and well-nourished. No distress.  Not his usual jovial self, sitting with his head down in his lap  HENT:  Right Ear: External ear normal.  Left Ear: External ear normal.  HOH; has postnasal drip present; no lymphadenopathy, sounds nasal/congested; TMs pink, no fluid behind them  Eyes: Pupils are equal, round, and reactive to light.  Cardiovascular: Normal rate, regular rhythm, normal heart sounds and intact distal pulses.   Pulmonary/Chest: Effort normal and breath sounds normal. No respiratory distress. He has no wheezes. He has no rales.  No rhonchi  Abdominal: Bowel sounds are normal.  Musculoskeletal: Normal range of motion. He exhibits no tenderness.  Neurological: He is alert and oriented to person, place, and time.  Skin: Skin is warm and dry.  Psychiatric: He has a normal mood and affect.     Labs reviewed: Basic Metabolic Panel:  Recent Labs  16/09/9611/04/14 0820 04/18/14 0919 08/31/14 0839  NA 138 141 138  K 4.7 4.7 4.2  CL 97 99 98  CO2 28 27 24   GLUCOSE 90 83 92  BUN 20 21 24   CREATININE 1.13 1.05 1.20  CALCIUM 9.3 9.5 9.5   Liver Function Tests:  Recent Labs  04/18/14 0919 08/31/14 0839  AST 21 19  ALT 18 18  ALKPHOS 79 71  BILITOT 0.6 0.5  PROT 7.1 7.1   No results found for this basename: LIPASE, AMYLASE,  in the last 8760 hours No results found for this basename: AMMONIA,  in the last 8760 hours CBC:  Recent Labs   12/02/13 0820 04/18/14 0919 08/31/14 0839  WBC 7.6 7.1 7.3  NEUTROABS 4.8 4.4 4.4  HGB 13.7 13.9 13.9  HCT 40.9 42.8 41.9  MCV 84 87 87  PLT  --   --  222   Lipid Panel:  Recent Labs  12/02/13 0820 04/18/14 0919 08/31/14 [id number]  HDL 31* 31* 34*  LDLCALC 73 82 79  TRIG 102 158* 128  CHOLHDL 4.0 4.7 4.1   Lab Results  Component Value Date   HGBA1C 5.7* 08/31/2014   Assessment/Plan 1. Other fatigue - will reassess labs but they were normal one month ago at his regular visit - CBC with Differential - Comprehensive metabolic panel  2. Acute maxillary sinusitis, recurrence not specified -suspect this is the cause of his chills, fatigue, loss of appetite; however, I also question if he does not have the flu but not fully manifested due to having received the vaccination one month ago -if he has continued chills or fever over the weekend, he is to go to the urgent care - CBC with Differential - Comprehensive metabolic panel  Labs/tests ordered:   Orders Placed This Encounter  Procedures  . CBC with Differential  . Comprehensive metabolic panel    Next appt:  Return prn and for his 3 month f/u  Drisana Schweickert L. Geronimo Diliberto, D.O. Geriatrics [facility] [address], [place] [address] Cell Phone (Mon-Fri 8am-5pm):  [phone] On Call:  [phone] & follow prompts after 5pm & weekends Office Phone:  [phone] Office Fax:  [phone]

## 2014-10-07 ENCOUNTER — Telehealth: Payer: Self-pay | Admitting: *Deleted

## 2014-10-07 LAB — CBC WITH DIFFERENTIAL/PLATELET
Basophils Absolute: 0 10*3/uL (ref 0.0–0.2)
Basos: 0 %
Eos: 1 %
Eosinophils Absolute: 0 10*3/uL (ref 0.0–0.4)
HCT: 41.3 % (ref 37.5–51.0)
Hemoglobin: 14.5 g/dL (ref 12.6–17.7)
Immature Grans (Abs): 0 10*3/uL (ref 0.0–0.1)
Immature Granulocytes: 0 %
Lymphocytes Absolute: 1.3 10*3/uL (ref 0.7–3.1)
Lymphs: 21 %
MCH: 29.3 pg (ref 26.6–33.0)
MCHC: 35.1 g/dL (ref 31.5–35.7)
MCV: 83 fL (ref 79–97)
Monocytes Absolute: 0.9 10*3/uL (ref 0.1–0.9)
Monocytes: 15 %
Neutrophils Absolute: 3.9 10*3/uL (ref 1.4–7.0)
Neutrophils Relative %: 63 %
RBC: 4.95 x10E6/uL (ref 4.14–5.80)
RDW: 13.9 % (ref 12.3–15.4)
WBC: 6.1 10*3/uL (ref 3.4–10.8)

## 2014-10-07 LAB — COMPREHENSIVE METABOLIC PANEL
ALT: 25 IU/L (ref 0–44)
AST: 32 IU/L (ref 0–40)
Albumin/Globulin Ratio: 1.4 (ref 1.1–2.5)
Albumin: 4.1 g/dL (ref 3.6–4.8)
Alkaline Phosphatase: 71 IU/L (ref 39–117)
BUN/Creatinine Ratio: 22 (ref 10–22)
BUN: 31 mg/dL — ABNORMAL HIGH (ref 8–27)
CO2: 24 mmol/L (ref 18–29)
Calcium: 8.9 mg/dL (ref 8.6–10.2)
Chloride: 93 mmol/L — ABNORMAL LOW (ref 97–108)
Creatinine, Ser: 1.42 mg/dL — ABNORMAL HIGH (ref 0.76–1.27)
GFR calc Af Amer: 58 mL/min/{1.73_m2} — ABNORMAL LOW (ref >59)
GFR calc non Af Amer: 50 mL/min/{1.73_m2} — ABNORMAL LOW (ref >59)
Globulin, Total: 3 g/dL (ref 1.5–4.5)
Glucose: 91 mg/dL (ref 65–99)
Potassium: 3.7 mmol/L (ref 3.5–5.2)
Sodium: 136 mmol/L (ref 134–144)
Total Bilirubin: 1 mg/dL (ref 0.0–1.2)
Total Protein: 7.1 g/dL (ref 6.0–8.5)

## 2014-10-07 NOTE — Telephone Encounter (Signed)
Message copied by Lamont SnowballICE, SHARON L on Fri Oct 07, 2014  3:43 PM ------      Message from: BrimsonREED, OregonIFFANY L      Created: Fri Oct 07, 2014  8:36 AM       Encourage him to drink fluids especially water.  His poor intake is visible on his labwork. ------

## 2014-10-07 NOTE — Telephone Encounter (Signed)
Spoke with the patient, he stated that he would continue to increase fluids over the weekend and he does feel some what better but not 100% yet.

## 2015-01-04 ENCOUNTER — Other Ambulatory Visit: Payer: Medicare Other

## 2015-01-04 DIAGNOSIS — I10 Essential (primary) hypertension: Secondary | ICD-10-CM

## 2015-01-04 DIAGNOSIS — M170 Bilateral primary osteoarthritis of knee: Secondary | ICD-10-CM

## 2015-01-04 DIAGNOSIS — E785 Hyperlipidemia, unspecified: Secondary | ICD-10-CM

## 2015-01-04 DIAGNOSIS — R739 Hyperglycemia, unspecified: Secondary | ICD-10-CM

## 2015-01-04 DIAGNOSIS — E669 Obesity, unspecified: Secondary | ICD-10-CM

## 2015-01-05 LAB — BASIC METABOLIC PANEL
BUN/Creatinine Ratio: 17 (ref 10–22)
BUN: 18 mg/dL (ref 8–27)
CO2: 29 mmol/L (ref 18–29)
Calcium: 9.7 mg/dL (ref 8.6–10.2)
Chloride: 98 mmol/L (ref 97–108)
Creatinine, Ser: 1.06 mg/dL (ref 0.76–1.27)
GFR calc Af Amer: 82 mL/min/{1.73_m2} (ref >59)
GFR calc non Af Amer: 71 mL/min/{1.73_m2} (ref >59)
Glucose: 99 mg/dL (ref 65–99)
Potassium: 5 mmol/L (ref 3.5–5.2)
Sodium: 140 mmol/L (ref 134–144)

## 2015-01-05 LAB — CBC WITH DIFFERENTIAL
Basophils Absolute: 0 10*3/uL (ref 0.0–0.2)
Basos: 0 %
Eos: 2 %
Eosinophils Absolute: 0.2 10*3/uL (ref 0.0–0.4)
HCT: 42.2 % (ref 37.5–51.0)
Hemoglobin: 14.6 g/dL (ref 12.6–17.7)
Immature Grans (Abs): 0 10*3/uL (ref 0.0–0.1)
Immature Granulocytes: 0 %
Lymphocytes Absolute: 2.2 10*3/uL (ref 0.7–3.1)
Lymphs: 29 %
MCH: 29.6 pg (ref 26.6–33.0)
MCHC: 34.6 g/dL (ref 31.5–35.7)
MCV: 85 fL (ref 79–97)
Monocytes Absolute: 0.5 10*3/uL (ref 0.1–0.9)
Monocytes: 7 %
Neutrophils Absolute: 4.7 10*3/uL (ref 1.4–7.0)
Neutrophils Relative %: 62 %
Platelets: 222 10*3/uL (ref 150–379)
RBC: 4.94 x10E6/uL (ref 4.14–5.80)
RDW: 14.6 % (ref 12.3–15.4)
WBC: 7.7 10*3/uL (ref 3.4–10.8)

## 2015-01-05 LAB — LIPID PANEL
Chol/HDL Ratio: 4.1 ratio units (ref 0.0–5.0)
Cholesterol, Total: 149 mg/dL (ref 100–199)
HDL: 36 mg/dL — ABNORMAL LOW (ref >39)
LDL Calculated: 89 mg/dL (ref 0–99)
Triglycerides: 119 mg/dL (ref 0–149)
VLDL Cholesterol Cal: 24 mg/dL (ref 5–40)

## 2015-01-05 LAB — HEMOGLOBIN A1C
Est. average glucose Bld gHb Est-mCnc: 128 mg/dL
Hgb A1c MFr Bld: 6.1 % — ABNORMAL HIGH (ref 4.8–5.6)

## 2015-01-06 ENCOUNTER — Encounter: Payer: Self-pay | Admitting: Internal Medicine

## 2015-01-06 ENCOUNTER — Ambulatory Visit (INDEPENDENT_AMBULATORY_CARE_PROVIDER_SITE_OTHER): Payer: Medicare Other | Admitting: Internal Medicine

## 2015-01-06 VITALS — BP 122/78 | HR 68 | Temp 98.1°F | Resp 10 | Ht 71.5 in | Wt 264.0 lb

## 2015-01-06 DIAGNOSIS — M545 Low back pain, unspecified: Secondary | ICD-10-CM

## 2015-01-06 DIAGNOSIS — R739 Hyperglycemia, unspecified: Secondary | ICD-10-CM

## 2015-01-06 DIAGNOSIS — E785 Hyperlipidemia, unspecified: Secondary | ICD-10-CM

## 2015-01-06 DIAGNOSIS — E669 Obesity, unspecified: Secondary | ICD-10-CM

## 2015-01-06 DIAGNOSIS — Z23 Encounter for immunization: Secondary | ICD-10-CM

## 2015-01-06 NOTE — Progress Notes (Signed)
Patient ID: Dean Fry, male   DOB: June 24, 1946, 69 y.o.   MRN: [id number]   Location:  [facility] / [facility]  Code Status: full code  No Known Allergies  Chief Complaint  Patient presents with  . Medical Management of Chronic Issues    4 month follow-up, discuss labs (copy printed). Discuss low back pain and weight gain   . Immunizations    Discuss need for prevnar     HPI: Patient is a 69 y.o. white male seen in the office today for med mgt of chronic diseases.    Remains frustrated with weight.  Has been working out and getting stronger.  BUT weight has gone up--18 lbs.  Has not ridden his bike as much.    Has low back pain and missed some of his chiropractor sessions for his degenerative disc in his back.  Has trouble getting up out of chair in the morning.  Has not taken anything for pain.  Gets sore in the back after works on computer or watches tv for a little.  Works on [employer] with 5# rubber ball filled with sand.     Thinks the flu shot caused his extreme fatigue for a week (but this was 3 wks after his flu shot).    Review of Systems:  Review of Systems  Constitutional: Negative for fever, chills and malaise/fatigue.       Wt gain  HENT: Positive for hearing loss.        Got his hearing aides and hearing very well today  Eyes: Negative for blurred vision.  Respiratory: Negative for shortness of breath.   Cardiovascular: Negative for chest pain, palpitations and leg swelling.  Gastrointestinal: Negative for abdominal pain, constipation, blood in stool and melena.  Genitourinary: Negative for dysuria.  Musculoskeletal: Positive for back pain. Negative for myalgias, joint pain and falls.  Skin: Negative for rash.  Neurological: Negative for dizziness and weakness.  Psychiatric/Behavioral: Positive for memory loss. Negative for depression.       He notes memory loss, I have not up to this point    Past Medical History    Diagnosis Date  . Impaired vision     glasses  . Hypercholesteremia   . Stroke 03/14/2008    pt states that he may have had a mild stroke 1 week after knee surgery  . Osteoarthritis of both knees     left knee  . Impaired hearing   . Atrial septal defect     corrected in 1962  . Rheumatic fever     at 69 years old  . Low back pain   . Shoulder pain     left, sees chiropracter for   . Cataracts, bilateral     "early stages"  . Hypertension     Sees Dr. Dorris Fetch    Past Surgical History  Procedure Laterality Date  . Asd repair    . Total knee arthroplasty  2009    right knee, Dr.Randal   . Knee surgery      4 surgeries on right knee  . Appendectomy  1979  . Plantar fascititis      bilateral surgery   . Asd repair    . Tonsillectomy  1954    and removal of adnoids  . Total knee arthroplasty  01/15/2013    Procedure: TOTAL KNEE ARTHROPLASTY;  Surgeon: Thera Flake., MD;  Location: [facility] OR;  Service: Orthopedics;  Laterality:  Left;  left total knee arthroplasty    Social History:   reports that he has never smoked. He does not have any smokeless tobacco history on file. He reports that he drinks about 1.0 oz of alcohol per week. He reports that he does not use illicit drugs.  Family History  Problem Relation Age of Onset  . Adopted: Yes  . Heart attack Father   . Pancreatic cancer Brother     Medications: Patient's Medications  New Prescriptions   No medications on file  Previous Medications   ASPIRIN 81 MG TABLET    Take 81 mg by mouth daily.   ATORVASTATIN (LIPITOR) 40 MG TABLET    Take 1 tablet (40 mg total) by mouth at bedtime.   LISINOPRIL-HYDROCHLOROTHIAZIDE (PRINZIDE,ZESTORETIC) 20-25 MG PER TABLET    Take 1 tablet by mouth daily.   OMEGA-3 ACID ETHYL ESTERS (LOVAZA) 1 G CAPSULE    Take 1 capsule (1 g total) by mouth 3 (three) times daily.  Modified Medications   No medications on file  Discontinued Medications   No medications on file      Physical Exam: Filed Vitals:   01/06/15 0933  BP: 122/78  Pulse: 68  Temp: 98.1 F (36.7 C)  TempSrc: Oral  Resp: 10  Height: 5' 11.5" (1.816 m)  Weight: 264 lb (119.75 kg)  SpO2: 98%  Physical Exam  Constitutional: He is oriented to person, place, and time. He appears well-developed and well-nourished. No distress.  Obese white male  HENT:  Hearing aides  Eyes:  glasses  Cardiovascular: Normal rate, regular rhythm, normal heart sounds and intact distal pulses.   Pulmonary/Chest: Effort normal and breath sounds normal. No respiratory distress.  Abdominal: Soft. Bowel sounds are normal. He exhibits no distension and no mass. There is no tenderness.  Musculoskeletal: Normal range of motion. He exhibits no tenderness.  Neurological: He is alert and oriented to person, place, and time.  Skin: Skin is warm and dry.  Psychiatric: He has a normal mood and affect. His behavior is normal. Judgment and thought content normal.     Labs reviewed: Basic Metabolic Panel:  Recent Labs  08/65/78 0839 10/06/14 0949 01/04/15 0841  NA 138 136 140  K 4.2 3.7 5.0  CL 98 93* 98  CO2 GLUCOSE 92 91 99  BUN 24 31* 18  CREATININE 1.20 1.42* 1.06  CALCIUM 9.5 8.9 9.7   Liver Function Tests:  Recent Labs  04/18/14 0919 08/31/14 0839 10/06/14 0949  AST 21 19 32  ALT ALKPHOS 79 71 71  BILITOT 0.6 0.5 1.0  PROT 7.1 7.1 7.1   No results for input(s): LIPASE, AMYLASE in the last 8760 hours. No results for input(s): AMMONIA in the last 8760 hours. CBC:  Recent Labs  08/31/14 0839 10/06/14 0949 01/04/15 0841  WBC 7.3 6.1 7.7  NEUTROABS 4.4 3.9 4.7  HGB 13.9 14.5 14.6  HCT 41.9 41.3 42.2  MCV 87 83 85  PLT 222  --  222   Lipid Panel:  Recent Labs  04/18/14 0919 08/31/14 0839 01/04/15 0841  HDL 31* 34* 36*  LDLCALC 82 79 89  TRIG 158* 128 119  CHOLHDL 4.7 4.1 4.1   Lab Results  Component Value Date   HGBA1C 6.1* 01/04/2015      Assessment/Plan 1. Obesity -again extensively discussed resuming his cardio regimen with biking -must continue dietary changes -if his weight continues to trend up, I will refer  him to a dietitian next time - Comprehensive metabolic panel; Future  2. Hyperlipidemia LDL goal <100 - at goal, but trended up must resume his cardio exercise - CBC With differential/Platelet; Future - Lipid panel; Future  3. Hyperglycemia -trending upward toward diabetes, I educated the patient on a low carb diet including decreased starches, fried foods, and sweets as well as regular exercise at least 3-5 times per week.   - Hemoglobin A1c; Future - Lipid panel; Future - Comprehensive metabolic panel; Future  4. Midline low back pain without sciatica -has been having more of this lately while doing strength training and has gained weight in his abdominal region by report--needs to work on core and cardio -has not required pain medication--resolves with rest  5. Need for vaccination with 13-polyvalent pneumococcal conjugate vaccine -prevnar given today  Labs/tests ordered: Orders Placed This Encounter  Procedures  . CBC With differential/Platelet    Standing Status: Future     Number of Occurrences:      Standing Expiration Date: 09/07/2015  . Hemoglobin A1c    Standing Status: Future     Number of Occurrences:      Standing Expiration Date: 09/07/2015  . Lipid panel    Standing Status: Future     Number of Occurrences:      Standing Expiration Date: 09/07/2015    Order Specific Question:  Has the patient fasted?    Answer:  Yes  . Comprehensive metabolic panel    Standing Status: Future     Number of Occurrences:      Standing Expiration Date: 09/07/2015    Order Specific Question:  Has the patient fasted?    Answer:  Yes    Next appt:  4 mos with labs before and MMSE (he is concerned about memory loss)  Ryshawn Sanzone L. Marine Lezotte, D.O. Geriatrics [facility] [address] [place], [place] [address] Cell Phone (Mon-Fri 8am-5pm):  [phone] On Call:  [phone] & follow prompts after 5pm & weekends Office Phone:  [phone] Office Fax:  [phone]

## 2015-05-10 ENCOUNTER — Other Ambulatory Visit: Payer: Medicare Other

## 2015-05-10 DIAGNOSIS — E785 Hyperlipidemia, unspecified: Secondary | ICD-10-CM

## 2015-05-10 DIAGNOSIS — R739 Hyperglycemia, unspecified: Secondary | ICD-10-CM

## 2015-05-10 DIAGNOSIS — E669 Obesity, unspecified: Secondary | ICD-10-CM

## 2015-05-11 LAB — COMPREHENSIVE METABOLIC PANEL
ALT: 23 IU/L (ref 0–44)
AST: 20 IU/L (ref 0–40)
Albumin/Globulin Ratio: 1.5 (ref 1.1–2.5)
Albumin: 4.1 g/dL (ref 3.6–4.8)
Alkaline Phosphatase: 69 IU/L (ref 39–117)
BUN/Creatinine Ratio: 17 (ref 10–22)
BUN: 18 mg/dL (ref 8–27)
Bilirubin Total: 0.6 mg/dL (ref 0.0–1.2)
CO2: 23 mmol/L (ref 18–29)
Calcium: 9.3 mg/dL (ref 8.6–10.2)
Chloride: 99 mmol/L (ref 97–108)
Creatinine, Ser: 1.08 mg/dL (ref 0.76–1.27)
GFR calc Af Amer: 81 mL/min/{1.73_m2} (ref >59)
GFR calc non Af Amer: 70 mL/min/{1.73_m2} (ref >59)
Globulin, Total: 2.8 g/dL (ref 1.5–4.5)
Glucose: 89 mg/dL (ref 65–99)
Potassium: 4.2 mmol/L (ref 3.5–5.2)
Sodium: 140 mmol/L (ref 134–144)
Total Protein: 6.9 g/dL (ref 6.0–8.5)

## 2015-05-11 LAB — CBC WITH DIFFERENTIAL
Basophils Absolute: 0 10*3/uL (ref 0.0–0.2)
Basos: 1 %
EOS (ABSOLUTE): 0.1 10*3/uL (ref 0.0–0.4)
Eos: 2 %
Hematocrit: 41.6 % (ref 37.5–51.0)
Hemoglobin: 13.7 g/dL (ref 12.6–17.7)
Immature Grans (Abs): 0 10*3/uL (ref 0.0–0.1)
Immature Granulocytes: 0 %
Lymphocytes Absolute: 2 10*3/uL (ref 0.7–3.1)
Lymphs: 29 %
MCH: 28.7 pg (ref 26.6–33.0)
MCHC: 32.9 g/dL (ref 31.5–35.7)
MCV: 87 fL (ref 79–97)
Monocytes Absolute: 0.5 10*3/uL (ref 0.1–0.9)
Monocytes: 8 %
Neutrophils Absolute: 4.1 10*3/uL (ref 1.4–7.0)
Neutrophils: 60 %
RBC: 4.78 x10E6/uL (ref 4.14–5.80)
RDW: 14.5 % (ref 12.3–15.4)
WBC: 6.8 10*3/uL (ref 3.4–10.8)

## 2015-05-11 LAB — LIPID PANEL
Chol/HDL Ratio: 3.7 ratio units (ref 0.0–5.0)
Cholesterol, Total: 123 mg/dL (ref 100–199)
HDL: 33 mg/dL — ABNORMAL LOW (ref >39)
LDL Calculated: 68 mg/dL (ref 0–99)
Triglycerides: 109 mg/dL (ref 0–149)
VLDL Cholesterol Cal: 22 mg/dL (ref 5–40)

## 2015-05-11 LAB — HEMOGLOBIN A1C
Est. average glucose Bld gHb Est-mCnc: 117 mg/dL
Hgb A1c MFr Bld: 5.7 % — ABNORMAL HIGH (ref 4.8–5.6)

## 2015-05-12 ENCOUNTER — Encounter: Payer: Self-pay | Admitting: Internal Medicine

## 2015-05-12 ENCOUNTER — Ambulatory Visit (INDEPENDENT_AMBULATORY_CARE_PROVIDER_SITE_OTHER): Payer: Medicare Other | Admitting: Internal Medicine

## 2015-05-12 VITALS — BP 118/66 | HR 64 | Temp 97.8°F | Ht 72.0 in | Wt 265.0 lb

## 2015-05-12 DIAGNOSIS — M545 Low back pain, unspecified: Secondary | ICD-10-CM

## 2015-05-12 DIAGNOSIS — E785 Hyperlipidemia, unspecified: Secondary | ICD-10-CM

## 2015-05-12 DIAGNOSIS — I10 Essential (primary) hypertension: Secondary | ICD-10-CM

## 2015-05-12 DIAGNOSIS — R739 Hyperglycemia, unspecified: Secondary | ICD-10-CM | POA: Diagnosis not present

## 2015-05-12 DIAGNOSIS — E669 Obesity, unspecified: Secondary | ICD-10-CM | POA: Diagnosis not present
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
## 2015-07-05 ENCOUNTER — Other Ambulatory Visit: Payer: Self-pay | Admitting: Internal Medicine

## 2015-09-01 ENCOUNTER — Other Ambulatory Visit: Payer: Self-pay

## 2015-09-01 MED ORDER — ATORVASTATIN CALCIUM 40 MG PO TABS: 40.0000 mg | ORAL_TABLET | Freq: Every day | ORAL | Status: DC

## 2015-09-06 ENCOUNTER — Other Ambulatory Visit: Payer: Medicare Other

## 2015-09-06 DIAGNOSIS — I10 Essential (primary) hypertension: Secondary | ICD-10-CM

## 2015-09-06 DIAGNOSIS — R739 Hyperglycemia, unspecified: Secondary | ICD-10-CM

## 2015-09-06 DIAGNOSIS — E785 Hyperlipidemia, unspecified: Secondary | ICD-10-CM

## 2015-09-07 LAB — LIPID PANEL
Chol/HDL Ratio: 3.8 ratio units (ref 0.0–5.0)
Cholesterol, Total: 123 mg/dL (ref 100–199)
HDL: 32 mg/dL — ABNORMAL LOW (ref >39)
LDL Calculated: 73 mg/dL (ref 0–99)
Triglycerides: 92 mg/dL (ref 0–149)
VLDL Cholesterol Cal: 18 mg/dL (ref 5–40)

## 2015-09-07 LAB — CBC WITH DIFFERENTIAL/PLATELET
Basophils Absolute: 0 10*3/uL (ref 0.0–0.2)
Basos: 0 %
EOS (ABSOLUTE): 0.1 10*3/uL (ref 0.0–0.4)
Eos: 1 %
Hematocrit: 40 % (ref 37.5–51.0)
Hemoglobin: 13.5 g/dL (ref 12.6–17.7)
Immature Grans (Abs): 0 10*3/uL (ref 0.0–0.1)
Immature Granulocytes: 0 %
Lymphocytes Absolute: 2 10*3/uL (ref 0.7–3.1)
Lymphs: 26 %
MCH: 28.8 pg (ref 26.6–33.0)
MCHC: 33.8 g/dL (ref 31.5–35.7)
MCV: 86 fL (ref 79–97)
Monocytes Absolute: 0.5 10*3/uL (ref 0.1–0.9)
Monocytes: 6 %
Neutrophils Absolute: 4.8 10*3/uL (ref 1.4–7.0)
Neutrophils: 67 %
Platelets: 215 10*3/uL (ref 150–379)
RBC: 4.68 x10E6/uL (ref 4.14–5.80)
RDW: 14.7 % (ref 12.3–15.4)
WBC: 7.4 10*3/uL (ref 3.4–10.8)

## 2015-09-07 LAB — COMPREHENSIVE METABOLIC PANEL
ALT: 22 IU/L (ref 0–44)
AST: 20 IU/L (ref 0–40)
Albumin/Globulin Ratio: 1.4 (ref 1.1–2.5)
Albumin: 4 g/dL (ref 3.6–4.8)
Alkaline Phosphatase: 71 IU/L (ref 39–117)
BUN/Creatinine Ratio: 15 (ref 10–22)
BUN: 15 mg/dL (ref 8–27)
Bilirubin Total: 0.7 mg/dL (ref 0.0–1.2)
CO2: 23 mmol/L (ref 18–29)
Calcium: 8.7 mg/dL (ref 8.6–10.2)
Chloride: 99 mmol/L (ref 97–108)
Creatinine, Ser: 0.97 mg/dL (ref 0.76–1.27)
GFR calc Af Amer: 92 mL/min/{1.73_m2} (ref >59)
GFR calc non Af Amer: 79 mL/min/{1.73_m2} (ref >59)
Globulin, Total: 2.9 g/dL (ref 1.5–4.5)
Glucose: 92 mg/dL (ref 65–99)
Potassium: 3.9 mmol/L (ref 3.5–5.2)
Sodium: 140 mmol/L (ref 134–144)
Total Protein: 6.9 g/dL (ref 6.0–8.5)

## 2015-09-07 LAB — HEMOGLOBIN A1C
Est. average glucose Bld gHb Est-mCnc: 117 mg/dL
Hgb A1c MFr Bld: 5.7 % — ABNORMAL HIGH (ref 4.8–5.6)

## 2015-09-08 ENCOUNTER — Encounter: Payer: Self-pay | Admitting: Internal Medicine

## 2015-09-08 ENCOUNTER — Ambulatory Visit (INDEPENDENT_AMBULATORY_CARE_PROVIDER_SITE_OTHER): Payer: Medicare Other | Admitting: Internal Medicine

## 2015-09-08 VITALS — BP 122/78 | HR 55 | Temp 97.8°F | Resp 14 | Ht 71.0 in | Wt 258.0 lb

## 2015-09-08 DIAGNOSIS — R739 Hyperglycemia, unspecified: Secondary | ICD-10-CM

## 2015-09-08 DIAGNOSIS — E669 Obesity, unspecified: Secondary | ICD-10-CM | POA: Diagnosis not present

## 2015-09-08 DIAGNOSIS — M1811 Unilateral primary osteoarthritis of first carpometacarpal joint, right hand: Secondary | ICD-10-CM

## 2015-09-08 DIAGNOSIS — Z0000 Encounter for general adult medical examination without abnormal findings: Secondary | ICD-10-CM

## 2015-09-08 DIAGNOSIS — I10 Essential (primary) hypertension: Secondary | ICD-10-CM | POA: Diagnosis not present

## 2015-09-08 DIAGNOSIS — E785 Hyperlipidemia, unspecified: Secondary | ICD-10-CM | POA: Diagnosis not present

## 2015-09-08 DIAGNOSIS — M10472 Other secondary gout, left ankle and foot: Secondary | ICD-10-CM

## 2015-09-08 DIAGNOSIS — M19041 Primary osteoarthritis, right hand: Secondary | ICD-10-CM

## 2015-09-08 DIAGNOSIS — Z23 Encounter for immunization: Secondary | ICD-10-CM | POA: Diagnosis not present

## 2015-09-08 NOTE — Progress Notes (Signed)
Passed clock drawing 
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
## 2015-09-09 LAB — URIC ACID: Uric Acid: 10 mg/dL — ABNORMAL HIGH (ref 3.7–8.6)

## 2015-09-09 LAB — SPECIMEN STATUS REPORT

## 2015-09-11 ENCOUNTER — Other Ambulatory Visit: Payer: Self-pay

## 2015-09-11 MED ORDER — ALLOPURINOL 100 MG PO TABS: 100.0000 mg | ORAL_TABLET | Freq: Every day | ORAL | Status: DC

## 2016-01-10 ENCOUNTER — Encounter: Payer: Self-pay | Admitting: Internal Medicine

## 2016-01-17 ENCOUNTER — Other Ambulatory Visit: Payer: Self-pay | Admitting: Internal Medicine

## 2016-01-26 ENCOUNTER — Other Ambulatory Visit: Payer: Self-pay | Admitting: Internal Medicine

## 2016-03-04 ENCOUNTER — Other Ambulatory Visit: Payer: Medicare Other

## 2016-03-05 ENCOUNTER — Other Ambulatory Visit: Payer: Medicare Other

## 2016-03-06 ENCOUNTER — Telehealth: Payer: Self-pay | Admitting: *Deleted

## 2016-03-06 ENCOUNTER — Other Ambulatory Visit: Payer: Medicare Other

## 2016-03-06 DIAGNOSIS — M10472 Other secondary gout, left ankle and foot: Secondary | ICD-10-CM | POA: Diagnosis not present

## 2016-03-06 DIAGNOSIS — E785 Hyperlipidemia, unspecified: Secondary | ICD-10-CM | POA: Diagnosis not present

## 2016-03-06 DIAGNOSIS — E669 Obesity, unspecified: Secondary | ICD-10-CM

## 2016-03-06 DIAGNOSIS — I10 Essential (primary) hypertension: Secondary | ICD-10-CM

## 2016-03-06 DIAGNOSIS — R739 Hyperglycemia, unspecified: Secondary | ICD-10-CM

## 2016-03-06 NOTE — Telephone Encounter (Signed)
Patient walked in office with letter from [employer] stating that patient as of February 1 will need to switch to [facility] to fill Mail order Rx's. Patient wants this changed in his chart for future refills. Pharmacy updated.

## 2016-03-06 NOTE — Telephone Encounter (Signed)
[facility] [phone]

## 2016-03-07 ENCOUNTER — Ambulatory Visit: Payer: Medicare Other | Admitting: Internal Medicine

## 2016-03-07 LAB — CBC WITH DIFFERENTIAL/PLATELET
Basophils Absolute: 0 10*3/uL (ref 0.0–0.2)
Basos: 1 %
EOS (ABSOLUTE): 0.1 10*3/uL (ref 0.0–0.4)
Eos: 2 %
Hematocrit: 40.9 % (ref 37.5–51.0)
Hemoglobin: 13.6 g/dL (ref 12.6–17.7)
Immature Grans (Abs): 0 10*3/uL (ref 0.0–0.1)
Immature Granulocytes: 0 %
Lymphocytes Absolute: 2 10*3/uL (ref 0.7–3.1)
Lymphs: 32 %
MCH: 28.8 pg (ref 26.6–33.0)
MCHC: 33.3 g/dL (ref 31.5–35.7)
MCV: 87 fL (ref 79–97)
Monocytes Absolute: 0.5 10*3/uL (ref 0.1–0.9)
Monocytes: 7 %
Neutrophils Absolute: 3.6 10*3/uL (ref 1.4–7.0)
Neutrophils: 58 %
Platelets: 202 10*3/uL (ref 150–379)
RBC: 4.73 x10E6/uL (ref 4.14–5.80)
RDW: 15 % (ref 12.3–15.4)
WBC: 6.3 10*3/uL (ref 3.4–10.8)

## 2016-03-07 LAB — LIPID PANEL
Chol/HDL Ratio: 3.7 ratio units (ref 0.0–5.0)
Cholesterol, Total: 117 mg/dL (ref 100–199)
HDL: 32 mg/dL — ABNORMAL LOW (ref >39)
LDL Calculated: 65 mg/dL (ref 0–99)
Triglycerides: 98 mg/dL (ref 0–149)
VLDL Cholesterol Cal: 20 mg/dL (ref 5–40)

## 2016-03-07 LAB — COMPREHENSIVE METABOLIC PANEL
ALT: 25 IU/L (ref 0–44)
AST: 18 IU/L (ref 0–40)
Albumin/Globulin Ratio: 1.5 (ref 1.1–2.5)
Albumin: 4.1 g/dL (ref 3.5–4.8)
Alkaline Phosphatase: 66 IU/L (ref 39–117)
BUN/Creatinine Ratio: 25 — ABNORMAL HIGH (ref 10–22)
BUN: 24 mg/dL (ref 8–27)
Bilirubin Total: 0.5 mg/dL (ref 0.0–1.2)
CO2: 24 mmol/L (ref 18–29)
Calcium: 8.9 mg/dL (ref 8.6–10.2)
Chloride: 103 mmol/L (ref 96–106)
Creatinine, Ser: 0.95 mg/dL (ref 0.76–1.27)
GFR calc Af Amer: 93 mL/min/{1.73_m2} (ref >59)
GFR calc non Af Amer: 81 mL/min/{1.73_m2} (ref >59)
Globulin, Total: 2.7 g/dL (ref 1.5–4.5)
Glucose: 92 mg/dL (ref 65–99)
Potassium: 4.1 mmol/L (ref 3.5–5.2)
Sodium: 144 mmol/L (ref 134–144)
Total Protein: 6.8 g/dL (ref 6.0–8.5)

## 2016-03-07 LAB — HEMOGLOBIN A1C
Est. average glucose Bld gHb Est-mCnc: 120 mg/dL
Hgb A1c MFr Bld: 5.8 % — ABNORMAL HIGH (ref 4.8–5.6)

## 2016-03-08 ENCOUNTER — Encounter: Payer: Self-pay | Admitting: Internal Medicine

## 2016-03-08 ENCOUNTER — Ambulatory Visit (INDEPENDENT_AMBULATORY_CARE_PROVIDER_SITE_OTHER): Payer: Medicare Other | Admitting: Internal Medicine

## 2016-03-08 VITALS — BP 130/80 | HR 53 | Temp 98.0°F | Ht 71.0 in | Wt 270.0 lb

## 2016-03-08 DIAGNOSIS — Z8739 Personal history of other diseases of the musculoskeletal system and connective tissue: Secondary | ICD-10-CM

## 2016-03-08 DIAGNOSIS — Z8639 Personal history of other endocrine, nutritional and metabolic disease: Secondary | ICD-10-CM

## 2016-03-08 DIAGNOSIS — R739 Hyperglycemia, unspecified: Secondary | ICD-10-CM | POA: Diagnosis not present

## 2016-03-08 DIAGNOSIS — Z1159 Encounter for screening for other viral diseases: Secondary | ICD-10-CM | POA: Diagnosis not present

## 2016-03-08 DIAGNOSIS — M67441 Ganglion, right hand: Secondary | ICD-10-CM

## 2016-03-08 DIAGNOSIS — I10 Essential (primary) hypertension: Secondary | ICD-10-CM

## 2016-03-08 DIAGNOSIS — E669 Obesity, unspecified: Secondary | ICD-10-CM

## 2016-03-08 DIAGNOSIS — E785 Hyperlipidemia, unspecified: Secondary | ICD-10-CM

## 2016-03-08 MED ORDER — ALLOPURINOL 100 MG PO TABS: 100.0000 mg | ORAL_TABLET | Freq: Every day | ORAL | Status: DC

## 2016-03-08 MED ORDER — ATORVASTATIN CALCIUM 40 MG PO TABS: 40.0000 mg | ORAL_TABLET | Freq: Every day | ORAL | Status: DC

## 2016-03-08 MED ORDER — LISINOPRIL-HYDROCHLOROTHIAZIDE 20-25 MG PO TABS: 1.0000 | ORAL_TABLET | Freq: Every day | ORAL | Status: DC

## 2016-03-08 MED ORDER — OMEGA-3-ACID ETHYL ESTERS 1 G PO CAPS: 1.0000 g | ORAL_CAPSULE | Freq: Three times a day (TID) | ORAL | Status: DC

## 2016-03-08 NOTE — Progress Notes (Signed)
Patient ID: Dean Fry, male   DOB: 04-08-1946, 70 y.o.   MRN: [id number]   Location:  [facility] clinic Provider:  Leatta Alewine L. Renato Gails, D.O., C.M.D.  Code Status: full code Goals of Care:  Advanced Directives 03/08/2016  Does patient have an advance directive? No  Would patient like information on creating an advanced directive? No - patient declined information   Chief Complaint  Patient presents with  . Medical Management of Chronic Issues    follow-up    HPI: Patient is a 70 y.o. male seen today for medical management of chronic diseases.    Says too much couch potato and not enough riding his bike.  Has altered his diet and a couple of weeks ago started drinking some smoothies.  Has increased his strength.  Up 12 lbs though since last visit.  Smoothies have had greens in them also.    Going to get cataract out at Dr. Ashley Royalty.    Then going to finish his dental work.    Hyperlipidemia:  Continues lipitor and working on diet and exercise.    Hypertension:  bp is at goal.    Pinky finger with "cyst" on it.  Previously had one on the bottom of his foot and it went away.  It affects his putter grip.  As advised by his physical therapist to massage it.    Has gone to a dietician a long time ago, but has not followed through.  Was really not exercising lately and didn't get out there.    Past Medical History  Diagnosis Date  . Impaired vision     glasses  . Hypercholesteremia   . Stroke (HCC) 03/14/2008    pt states that he may have had a mild stroke 1 week after knee surgery  . Osteoarthritis of both knees     left knee  . Impaired hearing   . Atrial septal defect     corrected in 1962  . Rheumatic fever     at 70 years old  . Low back pain   . Shoulder pain     left, sees chiropracter for   . Cataracts, bilateral     "early stages"  . Hypertension     Sees Dr. Dorris Fetch    Past Surgical History  Procedure Laterality Date  . Asd repair    . Total knee  arthroplasty  2009    right knee, Dr.Randal   . Knee surgery      4 surgeries on right knee  . Appendectomy  1979  . Plantar fascititis      bilateral surgery   . Asd repair    . Tonsillectomy  1954    and removal of adnoids  . Total knee arthroplasty  01/15/2013    Procedure: TOTAL KNEE ARTHROPLASTY;  Surgeon: Thera Flake., MD;  Location: [facility] OR;  Service: Orthopedics;  Laterality: Left;  left total knee arthroplasty    No Known Allergies    Medication List       This list is accurate as of: 03/08/16  9:43 AM.  Always use your most recent med list.               allopurinol 100 MG tablet  Commonly known as:  ZYLOPRIM  Take 1 tablet (100 mg total) by mouth daily.     aspirin 81 MG tablet  Take 81 mg by mouth daily.     atorvastatin 40 MG tablet  Commonly known as:  LIPITOR  Take 1 tablet (40 mg total) by mouth at bedtime.     lisinopril-hydrochlorothiazide 20-25 MG tablet  Commonly known as:  PRINZIDE,ZESTORETIC  TAKE 1 BY MOUTH DAILY     omega-3 acid ethyl esters 1 g capsule  Commonly known as:  LOVAZA  TAKE 1 BY MOUTH 3 TIMES DAILY        Review of Systems:  Review of Systems  Constitutional: Negative for fever, chills, weight loss and malaise/fatigue.  HENT: Positive for hearing loss. Negative for congestion.   Eyes: Positive for blurred vision.  Respiratory: Negative for cough and shortness of breath.   Cardiovascular: Negative for chest pain and leg swelling.  Gastrointestinal: Negative for abdominal pain, constipation, blood in stool and melena.  Genitourinary: Negative for dysuria, urgency and frequency.  Musculoskeletal: Negative for myalgias and joint pain.  Neurological: Negative for dizziness, loss of consciousness and weakness.  Endo/Heme/Allergies: Does not bruise/bleed easily.  Psychiatric/Behavioral: Negative for depression and memory loss.    Health Maintenance  Topic Date Due  . Hepatitis C Screening  [birth year]  . INFLUENZA VACCINE   07/30/2016  . COLONOSCOPY  03/18/2021  . TETANUS/TDAP  02/26/2022  . ZOSTAVAX  Completed  . PNA vac Low Risk Adult  Completed    Physical Exam: Filed Vitals:   03/08/16 0920  BP: 130/80  Pulse: 53  Temp: 98 F (36.7 C)  TempSrc: Oral  Height:  (1.803 m)  Weight: 270 lb (122.471 kg)  SpO2: 97%   Body mass index is 37.67 kg/(m^2). Physical Exam  Constitutional: He is oriented to person, place, and time. He appears well-developed and well-nourished.  Cardiovascular: Normal rate, regular rhythm, normal heart sounds and intact distal pulses.   Pulmonary/Chest: Effort normal and breath sounds normal. No respiratory distress.  Abdominal: Soft. Bowel sounds are normal. He exhibits no distension and no mass. There is no tenderness. There is no rebound and no guarding.  Musculoskeletal: Normal range of motion.  Cyst noted on right flexor tendon of finger, slightly tender and mobile  Neurological: He is alert and oriented to person, place, and time.  Skin: Skin is warm and dry.  Psychiatric: He has a normal mood and affect.    Labs reviewed: Basic Metabolic Panel:  Recent Labs  16/10/96 0839 09/06/15 0906 03/06/16 1005  NA 140 140 144  K 4.2 3.9 4.1  CL 99 99 103  CO2 GLUCOSE 89 92 92  BUN CREATININE 1.08 0.97 0.95  CALCIUM 9.3 8.7 8.9   Liver Function Tests:  Recent Labs  05/10/15 0839 09/06/15 0906 03/06/16 1005  AST ALT ALKPHOS 69 71 66  BILITOT 0.6 0.7 0.5  PROT 6.9 6.9 6.8  ALBUMIN 4.1 4.0 4.1   No results for input(s): LIPASE, AMYLASE in the last 8760 hours. No results for input(s): AMMONIA in the last 8760 hours. CBC:  Recent Labs  05/10/15 0839 09/06/15 0906 03/06/16 1005  WBC 6.8 7.4 6.3  NEUTROABS 4.1 4.8 3.6  HCT 41.6 40.0 40.9  MCV 87 86 87  PLT  --  215 202   Lipid Panel:  Recent Labs  05/10/15 0839 09/06/15 0906 03/06/16 1005  CHOL 123 123 117  HDL 33* 32* 32*  LDLCALC 68 73 65    TRIG 109 92 98  CHOLHDL 3.7 3.8 3.7   Lab Results  Component Value Date   HGBA1C 5.8* 03/06/2016    Assessment/Plan  1. Ganglion cyst of flexor tendon sheath of finger of right hand -noted today  -if this remains a problem or is interfering with his function, discussed that it can be removed, but some return--he's ok with watchful waiting at present  2. Hyperlipidemia LDL goal <10 - at goal  -cont atorvastatin (LIPITOR) 40 MG tablet; Take 1 tablet (40 mg total) by mouth at bedtime.  Dispense: 90 tablet; Refill: 3 - omega-3 acid ethyl esters (LOVAZA) 1 g capsule; Take 1 capsule (1 g total) by mouth 3 (three) times daily.  Dispense: 270 capsule; Refill: 3 - Lipid panel; Future  3. Obesity - given goal to lose 20 lbs in 3 mos which we both agreed was probably lofty but a good motivator so he is going to work on it--counseled on diet and exercise again and he does seem to know what to do, just has to actually do it - Basic metabolic panel; Future  4. Hyperglycemia - stable, but must lose weight, is getting back on his bike, but still not doing the best with the diet portion  - Basic metabolic panel; Future - Hemoglobin A1c; Future  5. Essential hypertension - has been well-controlled and tolerates bp med well so cont same plus work on diet and exercise - lisinopril-hydrochlorothiazide (PRINZIDE,ZESTORETIC) 20-25 MG tablet; Take 1 tablet by mouth daily.  Dispense: 90 tablet; Refill: 3 - Basic metabolic panel; Future  6. Need for hepatitis C screening test - Hep C Antibody; Future before next visit  7. History of gout - continue on his allopurinol--no recent flares, but does eat classic gout foods - allopurinol (ZYLOPRIM) 100 MG tablet; Take 1 tablet (100 mg total) by mouth daily.  Dispense: 90 tablet; Refill: 3   Labs/tests ordered:   Orders Placed This Encounter  Procedures  . Basic metabolic panel    Standing Status: Future     Number of Occurrences:      Standing  Expiration Date: 09/08/2016    Order Specific Question:  Has the patient fasted?    Answer:  Yes  . Hemoglobin A1c    Standing Status: Future     Number of Occurrences:      Standing Expiration Date: 09/08/2016  . Lipid panel    Standing Status: Future     Number of Occurrences:      Standing Expiration Date: 09/08/2016    Order Specific Question:  Has the patient fasted?    Answer:  Yes  . Hep C Antibody    Standing Status: Future     Number of Occurrences:      Standing Expiration Date: 09/08/2016    Next appt:  06/14/2016 med mgt to assess if he's moving toward his goals   Dean Fry, D.O. Geriatrics [facility] [address] , [place] [address] Cell Phone (Mon-Fri 8am-5pm):  [phone] On Call:  [phone] & follow prompts after 5pm & weekends Office Phone:  [phone] Office Fax:  [phone]

## 2016-03-08 NOTE — Patient Instructions (Signed)
Obesity Obesity is defined as having too much total body fat and a body mass index (BMI) of 30 or more. BMI is an estimate of body fat and is calculated from your height and weight. BMI is typically calculated by your health care provider during regular wellness visits. Obesity happens when you consume more calories than you can burn by exercising or performing daily physical tasks. Prolonged obesity can cause major illnesses or emergencies, such as:  Stroke.  Heart disease.  Diabetes.  Cancer.  Arthritis.  High blood pressure (hypertension).  High cholesterol.  Sleep apnea.  Erectile dysfunction.  Infertility problems. CAUSES   Regularly eating unhealthy foods.  Physical inactivity.  Certain disorders, such as an underactive thyroid (hypothyroidism), Cushing's syndrome, and polycystic ovarian syndrome.  Certain medicines, such as steroids, some depression medicines, and antipsychotics.  Genetics.  Lack of sleep. DIAGNOSIS A health care provider can diagnose obesity after calculating your BMI. Obesity will be diagnosed if your BMI is 30 or higher. There are other methods of measuring obesity levels. Some other methods include measuring your skinfold thickness, your waist circumference, and comparing your hip circumference to your waist circumference. TREATMENT  A healthy treatment program includes some or all of the following:  Long-term dietary changes.  Exercise and physical activity.  Behavioral and lifestyle changes.  Medicine only under the supervision of your health care provider. Medicines may help, but only if they are used with diet and exercise programs. If your BMI is 40 or higher, your health care provider may recommend specialized surgery or programs to help with weight loss. An unhealthy treatment program includes:  Fasting.  Fad diets.  Supplements and drugs. These choices do not succeed in long-term weight control. HOME CARE  INSTRUCTIONS  Exercise and perform physical activity as directed by your health care provider. To increase physical activity, try the following:  Use stairs instead of elevators.  Park farther away from store entrances.  Garden, bike, or walk instead of watching television or using the computer.  Eat healthy, low-calorie foods and drinks on a regular basis. Eat more fruits and vegetables. Use low-calorie cookbooks or take healthy cooking classes.  Limit fast food, sweets, and processed snack foods.  Eat smaller portions.  Keep a daily journal of everything you eat. There are many free websites to help you with this. It may be helpful to measure your foods so you can determine if you are eating the correct portion sizes.  Avoid drinking alcohol. Drink more water and drinks without calories.  Take vitamins and supplements only as recommended by your health care provider.  Weight-loss support groups, Government social research officer, counselors, and stress reduction education can also be very helpful. SEEK IMMEDIATE MEDICAL CARE IF:  You have chest pain or tightness.  You have trouble breathing or feel short of breath.  You have weakness or leg numbness.  You feel confused or have trouble talking.  You have sudden changes in your vision.   This information is not intended to replace advice given to you by your health care provider. Make sure you discuss any questions you have with your health care provider.   Document Released: 01/23/2005 Document Revised: 01/06/2015 Document Reviewed: 01/22/2012 Elsevier Interactive Patient Education 2016 [employer].  Exercising to [employer] Exercising can help you to lose weight. In order to lose weight through exercise, you need to do vigorous-intensity exercise. You can tell that you are exercising with vigorous intensity if you are breathing very hard and fast and cannot  hold a conversation while exercising. Moderate-intensity exercise helps to  maintain your current weight. You can tell that you are exercising at a moderate level if you have a higher heart rate and faster breathing, but you are still able to hold a conversation. HOW OFTEN SHOULD I EXERCISE? Choose an activity that you enjoy and set realistic goals. Your health care provider can help you to make an activity plan that works for you. Exercise regularly as directed by your health care provider. This may include:  Doing resistance training twice each week, such as:  Push-ups.  Sit-ups.  Lifting weights.  Using resistance bands.  Doing a given intensity of exercise for a given amount of time. Choose from these options:  150 minutes of moderate-intensity exercise every week.  75 minutes of vigorous-intensity exercise every week.  A mix of moderate-intensity and vigorous-intensity exercise every week. Children, pregnant women, people who are out of shape, people who are overweight, and older adults may need to consult a health care provider for individual recommendations. If you have any sort of medical condition, be sure to consult your health care provider before starting a new exercise program. WHAT ARE SOME ACTIVITIES THAT CAN HELP ME TO LOSE WEIGHT?   Walking at a rate of at least 4.5 miles an hour.  Jogging or running at a rate of 5 miles per hour.  Biking at a rate of at least 10 miles per hour.  Lap swimming.  Roller-skating or in-line skating.  Cross-country skiing.  Vigorous competitive sports, such as football, basketball, and soccer.  Jumping rope.  Aerobic dancing. HOW CAN I BE MORE ACTIVE IN MY DAY-TO-DAY ACTIVITIES?  Use the stairs instead of the elevator.  Take a walk during your lunch break.  If you drive, park your car farther away from work or school.  If you take public transportation, get off one stop early and walk the rest of the way.  Make all of your phone calls while standing up and walking around.  Get up, stretch, and  walk around every 30 minutes throughout the day. WHAT GUIDELINES SHOULD I FOLLOW WHILE EXERCISING?  Do not exercise so much that you hurt yourself, feel dizzy, or get very short of breath.  Consult your health care provider prior to starting a new exercise program.  Wear comfortable clothes and shoes with good support.  Drink plenty of water while you exercise to prevent dehydration or heat stroke. Body water is lost during exercise and must be replaced.  Work out until you breathe faster and your heart beats faster.   This information is not intended to replace advice given to you by your health care provider. Make sure you discuss any questions you have with your health care provider.   Document Released: 01/18/2011 Document Revised: 01/06/2015 Document Reviewed: 05/19/2014 Elsevier Interactive Patient Education [employer]2016 Elsevier Inc.

## 2016-03-12 DIAGNOSIS — M67441 Ganglion, right hand: Secondary | ICD-10-CM | POA: Insufficient documentation

## 2016-03-12 DIAGNOSIS — Z8739 Personal history of other diseases of the musculoskeletal system and connective tissue: Secondary | ICD-10-CM | POA: Insufficient documentation

## 2016-03-13 ENCOUNTER — Other Ambulatory Visit: Payer: Medicare Other

## 2016-04-11 DIAGNOSIS — H2513 Age-related nuclear cataract, bilateral: Secondary | ICD-10-CM | POA: Diagnosis not present

## 2016-04-11 DIAGNOSIS — H2512 Age-related nuclear cataract, left eye: Secondary | ICD-10-CM | POA: Diagnosis not present

## 2016-05-01 DIAGNOSIS — H2511 Age-related nuclear cataract, right eye: Secondary | ICD-10-CM | POA: Diagnosis not present

## 2016-05-01 DIAGNOSIS — H2512 Age-related nuclear cataract, left eye: Secondary | ICD-10-CM | POA: Diagnosis not present

## 2016-05-08 DIAGNOSIS — H2511 Age-related nuclear cataract, right eye: Secondary | ICD-10-CM | POA: Diagnosis not present

## 2016-06-03 DIAGNOSIS — Z961 Presence of intraocular lens: Secondary | ICD-10-CM | POA: Diagnosis not present

## 2016-06-12 ENCOUNTER — Other Ambulatory Visit: Payer: Medicare Other

## 2016-06-12 DIAGNOSIS — R739 Hyperglycemia, unspecified: Secondary | ICD-10-CM | POA: Diagnosis not present

## 2016-06-12 DIAGNOSIS — E785 Hyperlipidemia, unspecified: Secondary | ICD-10-CM

## 2016-06-12 DIAGNOSIS — I10 Essential (primary) hypertension: Secondary | ICD-10-CM | POA: Diagnosis not present

## 2016-06-12 DIAGNOSIS — E669 Obesity, unspecified: Secondary | ICD-10-CM

## 2016-06-12 DIAGNOSIS — Z1159 Encounter for screening for other viral diseases: Secondary | ICD-10-CM

## 2016-06-13 ENCOUNTER — Encounter: Payer: Self-pay | Admitting: *Deleted

## 2016-06-13 LAB — HEMOGLOBIN A1C
Est. average glucose Bld gHb Est-mCnc: 114 mg/dL
Hgb A1c MFr Bld: 5.6 % (ref 4.8–5.6)

## 2016-06-13 LAB — LIPID PANEL
Chol/HDL Ratio: 3.5 ratio units (ref 0.0–5.0)
Cholesterol, Total: 117 mg/dL (ref 100–199)
HDL: 33 mg/dL — ABNORMAL LOW (ref >39)
LDL Calculated: 63 mg/dL (ref 0–99)
Triglycerides: 107 mg/dL (ref 0–149)
VLDL Cholesterol Cal: 21 mg/dL (ref 5–40)

## 2016-06-13 LAB — HEPATITIS C ANTIBODY: Hep C Virus Ab: <0.1 s/co ratio (ref 0.0–0.9)

## 2016-06-13 LAB — BASIC METABOLIC PANEL
BUN/Creatinine Ratio: 23 (ref 10–24)
BUN: 23 mg/dL (ref 8–27)
CO2: 20 mmol/L (ref 18–29)
Calcium: 9 mg/dL (ref 8.6–10.2)
Chloride: 100 mmol/L (ref 96–106)
Creatinine, Ser: 0.98 mg/dL (ref 0.76–1.27)
GFR calc Af Amer: 90 mL/min/{1.73_m2} (ref >59)
GFR calc non Af Amer: 78 mL/min/{1.73_m2} (ref >59)
Glucose: 84 mg/dL (ref 65–99)
Potassium: 4.5 mmol/L (ref 3.5–5.2)
Sodium: 142 mmol/L (ref 134–144)

## 2016-06-14 ENCOUNTER — Ambulatory Visit (INDEPENDENT_AMBULATORY_CARE_PROVIDER_SITE_OTHER): Payer: Medicare Other | Admitting: Internal Medicine

## 2016-06-14 ENCOUNTER — Encounter: Payer: Self-pay | Admitting: Internal Medicine

## 2016-06-14 VITALS — BP 148/80 | HR 58 | Temp 98.2°F | Ht 71.0 in | Wt 265.0 lb

## 2016-06-14 DIAGNOSIS — E669 Obesity, unspecified: Secondary | ICD-10-CM | POA: Diagnosis not present

## 2016-06-14 DIAGNOSIS — R739 Hyperglycemia, unspecified: Secondary | ICD-10-CM | POA: Diagnosis not present

## 2016-06-14 DIAGNOSIS — I10 Essential (primary) hypertension: Secondary | ICD-10-CM | POA: Diagnosis not present

## 2016-06-14 DIAGNOSIS — E785 Hyperlipidemia, unspecified: Secondary | ICD-10-CM | POA: Diagnosis not present

## 2016-06-14 DIAGNOSIS — M170 Bilateral primary osteoarthritis of knee: Secondary | ICD-10-CM
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
## 2016-09-30 DIAGNOSIS — Z961 Presence of intraocular lens: Secondary | ICD-10-CM | POA: Diagnosis not present

## 2016-11-12 ENCOUNTER — Encounter: Payer: Self-pay | Admitting: Internal Medicine

## 2016-11-25 ENCOUNTER — Ambulatory Visit (INDEPENDENT_AMBULATORY_CARE_PROVIDER_SITE_OTHER): Payer: Medicare Other

## 2016-11-25 DIAGNOSIS — Z23 Encounter for immunization: Secondary | ICD-10-CM | POA: Diagnosis not present

## 2016-12-18 ENCOUNTER — Other Ambulatory Visit: Payer: Medicare Other

## 2016-12-20 ENCOUNTER — Encounter: Payer: Medicare Other | Admitting: Internal Medicine

## 2017-01-07 ENCOUNTER — Encounter: Payer: Self-pay | Admitting: Nurse Practitioner

## 2017-01-07 ENCOUNTER — Ambulatory Visit (INDEPENDENT_AMBULATORY_CARE_PROVIDER_SITE_OTHER): Payer: Medicare Other | Admitting: Nurse Practitioner

## 2017-01-07 VITALS — BP 132/74 | HR 71 | Temp 98.2°F | Resp 17 | Ht 71.0 in | Wt 266.2 lb

## 2017-01-07 DIAGNOSIS — M25571 Pain in right ankle and joints of right foot: Secondary | ICD-10-CM | POA: Diagnosis not present

## 2017-01-07 NOTE — Progress Notes (Signed)
Careteam: Patient Care Team: Kermit Baloiffany L Reed, DO as PCP - General (Geriatric Medicine)  Advanced Directive information Does Patient Have a Medical Advance Directive?: No, Would patient like information on creating a medical advance directive?: Yes (MAU/Ambulatory/Procedural Areas - Information given) (Pt has paperwork but has not completed it yet)  No Known Allergies  Chief Complaint  Patient presents with  . Acute Visit    Pt thinks he has broken bone in top of foot.Unsure of when but swelling and tenderness has been x 9-12 days.      HPI: Patient is a 71 y.o. male seen in the office today due to right foot pain. He reports pain in the top of his foot that began 9-12 days ago. He does not remember anything specific that caused the pain. He does remember that he tripped without falling one day and questions if this could be the cause of his pain. Took gout medicine when it started thinking it may have been gout - this was not effective so he quit taking it. No other treatments tried. Rates pain 1-2/10 and describes it as an ache. He was at home over the weekend and had been doing some exercises on Sunday (2 days ago); when he went to bed that night and took off his soak he noticed increased swelling. Swelling has gone down a lot since then. No difficulty or pain when riding street bike or exercising. Afraid to do any exercising until he figures out what caused the swelling.  Pt with hx of gout, does not routinely take allopurinol, uric acid 10.0 in September 2016.   Review of Systems:  Review of Systems  Constitutional: Negative for activity change and fatigue.  Musculoskeletal: Positive for arthralgias. Negative for gait problem, joint swelling and myalgias.       Tenderness and swelling reported of right foot    Past Medical History:  Diagnosis Date  . Atrial septal defect    corrected in 1962  . Cataracts, bilateral    "early stages"  . Hypercholesteremia   . Hypertension    Sees Dr. Dorris Fetchiffany Reid  . Impaired hearing   . Impaired vision    glasses  . Low back pain   . Osteoarthritis of both knees    left knee  . Rheumatic fever    at 71 years old  . Shoulder pain    left, sees chiropracter for   . Stroke (HCC) 03/14/2008   pt states that he may have had a mild stroke 1 week after knee surgery   Past Surgical History:  Procedure Laterality Date  . APPENDECTOMY  1979  . ASD REPAIR    . ASD REPAIR    . KNEE SURGERY     4 surgeries on right knee  . plantar fascititis     bilateral surgery   . TONSILLECTOMY  1954   and removal of adnoids  . TOTAL KNEE ARTHROPLASTY  2009   right knee, Dr.Randal   . TOTAL KNEE ARTHROPLASTY  01/15/2013   Procedure: TOTAL KNEE ARTHROPLASTY;  Surgeon: Thera FlakeW D Caffrey Jr., MD;  Location: [facility] OR;  Service: Orthopedics;  Laterality: Left;  left total knee arthroplasty   Social History:   reports that he has never smoked. He has never used smokeless tobacco. He reports that he drinks about 1.2 oz of alcohol per week . He reports that he does not use drugs.  Family History  Problem Relation Age of Onset  . Adopted: Yes  .  Heart attack Father   . Pancreatic cancer Brother     Medications: Patient's Medications  New Prescriptions   No medications on file  Previous Medications   ALLOPURINOL (ZYLOPRIM) 100 MG TABLET    Take 1 tablet (100 mg total) by mouth daily.   ASPIRIN 81 MG TABLET    Take 81 mg by mouth daily.   ATORVASTATIN (LIPITOR) 40 MG TABLET    Take 1 tablet (40 mg total) by mouth at bedtime.   LISINOPRIL-HYDROCHLOROTHIAZIDE (PRINZIDE,ZESTORETIC) 20-25 MG TABLET    Take 1 tablet by mouth daily.   OMEGA-3 ACID ETHYL ESTERS (LOVAZA) 1 G CAPSULE    Take 1 capsule (1 g total) by mouth 3 (three) times daily.  Modified Medications   No medications on file  Discontinued Medications   No medications on file     Physical Exam:  Vitals:   01/07/17 1132  BP: 132/74  Pulse: 71  Resp: 17  Temp: 98.2 F (36.8 C)    TempSrc: Oral  SpO2: 97%  Weight: 266 lb 3.2 oz (120.7 kg)  Height: 5\' 11"  (1.803 m)   Body mass index is 37.13 kg/m.  Physical Exam  Constitutional: He is oriented to person, place, and time. He appears well-developed and well-nourished.  Cardiovascular: Normal rate, regular rhythm, normal heart sounds and intact distal pulses.   Pulmonary/Chest: Effort normal and breath sounds normal.  Musculoskeletal: Normal range of motion. He exhibits no edema.       Right foot: There is normal range of motion, no tenderness, no swelling, no crepitus, no deformity and no laceration.  Neurological: He is alert and oriented to person, place, and time.  Skin: Skin is warm and dry.  Psychiatric: He has a normal mood and affect. His behavior is normal. Judgment and thought content normal.    Labs reviewed: Basic Metabolic Panel:  Recent Labs  40/98/11 1005 06/12/16 0816  NA 144 142  K 4.1 4.5  CL 103 100  CO2 24 20  GLUCOSE 92 84  BUN 24 23  CREATININE 0.95 0.98  CALCIUM 8.9 9.0   Liver Function Tests:  Recent Labs  03/06/16 1005  AST 18  ALT 25  ALKPHOS 66  BILITOT 0.5  PROT 6.8  ALBUMIN 4.1   No results for input(s): LIPASE, AMYLASE in the last 8760 hours. No results for input(s): AMMONIA in the last 8760 hours. CBC:  Recent Labs  03/06/16 1005  WBC 6.3  NEUTROABS 3.6  HCT 40.9  MCV 87  PLT 202   Lipid Panel:  Recent Labs  03/06/16 1005 06/12/16 0816  CHOL 117 117  HDL 32* 33*  LDLCALC 65 63  TRIG 98 107  CHOLHDL 3.7 3.5   TSH: No results for input(s): TSH in the last 8760 hours. A1C: Lab Results  Component Value Date   HGBA1C 5.6 06/12/2016     Assessment/Plan 1. Pain in joint involving right ankle and foot - Pain/swelling improved, currently not having any pain -if pain recurs can use  Aleve 1 tablet BID x7 days,  May wear brace, Ice foot/ankle 20 minutes BID - Increase activity as tolerated -pain may be due to gout due to hx and not taking  medication will follow up  Uric acid - Encouraged to take Allopurinol daily as prescribed   Follow-up if needed  Samarrah Tranchina K. Biagio Borg  [facility] [phone] 8 am - 5 pm) [phone] (after hours)

## 2017-01-07 NOTE — Patient Instructions (Addendum)
Aleve by mouth twice daily for 7 days Ice foot/ankle 20 mins twice daily  May use brace  Increase activity as tolerated       Ankle Pain Many things can cause ankle pain, including an injury to the area and overuse of the ankle.The ankle joint holds your body weight and allows you to move around. Ankle pain can occur on either side or the back of one ankle or both ankles. Ankle pain may be sharp and burning or dull and aching. There may be tenderness, stiffness, redness, or warmth around the ankle. Follow these instructions at home: Activity  Rest your ankle as told by your health care provider. Avoid any activities that cause ankle pain.  Do exercises as told by your health care provider.  Ask your health care provider if you can drive. Using a brace, a bandage, or crutches  If you were given a brace:  Wear it as told by your health care provider.  Remove it when you take a bath or a shower.  Try not to move your ankle very much, but wiggle your toes from time to time. This helps to prevent swelling.  If you were given an elastic bandage:  Remove it when you take a bath or a shower.  Try not to move your ankle very much, but wiggle your toes from time to time. This helps to prevent swelling.  Adjust the bandage to make it more comfortable if it feels too tight.  Loosen the bandage if you have numbness or tingling in your foot or if your foot turns cold and blue.  If you have crutches, use them as told by your health care provider. Continue to use them until you can walk without feeling pain in your ankle. Managing pain, stiffness, and swelling  Raise (elevate) your ankle above the level of your heart while you are sitting or lying down.  If directed, apply ice to the area:  Put ice in a plastic bag.  Place a towel between your skin and the bag.  Leave the ice on for 20 minutes, 2-3 times per day. General instructions  Keep all follow-up visits as told by your  health care provider. This is important.  Record this information that may be helpful for you and your health care provider:  How often you have ankle pain.  Where the pain is located.  What the pain feels like.  Take over-the-counter and prescription medicines only as told by your health care provider. Contact a health care provider if:  Your pain gets worse.  Your pain is not relieved with medicines.  You have a fever or chills.  You are having more trouble with walking.  You have new symptoms. Get help right away if:  Your foot, leg, toes, or ankle tingles or becomes numb.  Your foot, leg, toes, or ankle becomes swollen.  Your foot, leg, toes, or ankle turns pale or blue. This information is not intended to replace advice given to you by your health care provider. Make sure you discuss any questions you have with your health care provider. Document Released: 06/05/2010 Document Revised: 08/16/2016 Document Reviewed: 07/18/2015 Elsevier Interactive Patient Education  2017 [employer]

## 2017-01-08 LAB — URIC ACID: URIC ACID, SERUM: 7.7 mg/dL (ref 4.0–8.0)

## 2017-02-21 ENCOUNTER — Other Ambulatory Visit: Payer: Self-pay

## 2017-02-21 DIAGNOSIS — E785 Hyperlipidemia, unspecified: Secondary | ICD-10-CM

## 2017-02-21 DIAGNOSIS — I10 Essential (primary) hypertension: Secondary | ICD-10-CM

## 2017-02-21 DIAGNOSIS — R739 Hyperglycemia, unspecified: Secondary | ICD-10-CM

## 2017-03-13 ENCOUNTER — Other Ambulatory Visit: Payer: Self-pay

## 2017-03-13 DIAGNOSIS — Z8739 Personal history of other diseases of the musculoskeletal system and connective tissue: Secondary | ICD-10-CM

## 2017-03-13 DIAGNOSIS — I10 Essential (primary) hypertension: Secondary | ICD-10-CM

## 2017-03-13 DIAGNOSIS — E785 Hyperlipidemia, unspecified: Secondary | ICD-10-CM

## 2017-03-13 MED ORDER — ATORVASTATIN CALCIUM 40 MG PO TABS: 40.0000 mg | ORAL_TABLET | Freq: Every day | ORAL | 1 refills | Status: DC

## 2017-03-13 MED ORDER — OMEGA-3-ACID ETHYL ESTERS 1 G PO CAPS: 1.0000 g | ORAL_CAPSULE | Freq: Three times a day (TID) | ORAL | 1 refills | Status: DC

## 2017-03-13 MED ORDER — LISINOPRIL-HYDROCHLOROTHIAZIDE 20-25 MG PO TABS: 1.0000 | ORAL_TABLET | Freq: Every day | ORAL | 1 refills | Status: DC

## 2017-03-13 MED ORDER — ALLOPURINOL 100 MG PO TABS: 100.0000 mg | ORAL_TABLET | Freq: Every day | ORAL | 1 refills | Status: DC

## 2017-03-13 NOTE — Telephone Encounter (Signed)
Message left on clinical intake voicemail:   [facility]  I called patient back x 3 -busy signal, I was calling to verify mail order company. I will try again later

## 2017-03-13 NOTE — Telephone Encounter (Signed)
Patient stopped by the office, rx's sent

## 2017-03-19 ENCOUNTER — Other Ambulatory Visit: Payer: Medicare Other

## 2017-03-19 DIAGNOSIS — R739 Hyperglycemia, unspecified: Secondary | ICD-10-CM

## 2017-03-19 DIAGNOSIS — E785 Hyperlipidemia, unspecified: Secondary | ICD-10-CM

## 2017-03-19 DIAGNOSIS — I10 Essential (primary) hypertension: Secondary | ICD-10-CM | POA: Diagnosis not present

## 2017-03-19 LAB — LIPID PANEL
Cholesterol: 112 mg/dL (ref <200)
HDL: 30 mg/dL — ABNORMAL LOW (ref >40)
LDL Cholesterol: 65 mg/dL (ref <100)
Total CHOL/HDL Ratio: 3.7 Ratio (ref <5.0)
Triglycerides: 85 mg/dL (ref <150)
VLDL: 17 mg/dL (ref <30)

## 2017-03-19 LAB — COMPLETE METABOLIC PANEL WITH GFR
ALT: 20 U/L (ref 9–46)
AST: 18 U/L (ref 10–35)
Albumin: 4 g/dL (ref 3.6–5.1)
Alkaline Phosphatase: 64 U/L (ref 40–115)
BUN: 20 mg/dL (ref 7–25)
CO2: 31 mmol/L (ref 20–31)
Calcium: 9.1 mg/dL (ref 8.6–10.3)
Chloride: 102 mmol/L (ref 98–110)
Creat: 1.02 mg/dL (ref 0.70–1.18)
GFR, Est African American: 85 mL/min (ref >=60)
GFR, Est Non African American: 74 mL/min (ref >=60)
Glucose, Bld: 93 mg/dL (ref 65–99)
Potassium: 4.2 mmol/L (ref 3.5–5.3)
Sodium: 139 mmol/L (ref 135–146)
Total Bilirubin: 0.8 mg/dL (ref 0.2–1.2)
Total Protein: 6.7 g/dL (ref 6.1–8.1)

## 2017-03-19 LAB — CBC WITH DIFFERENTIAL/PLATELET
Basophils Absolute: 63 cells/uL (ref 0–200)
Basophils Relative: 1 %
Eosinophils Absolute: 126 cells/uL (ref 15–500)
Eosinophils Relative: 2 %
HCT: 39.9 % (ref 38.5–50.0)
Hemoglobin: 13.6 g/dL (ref 13.2–17.1)
Lymphocytes Relative: 32 %
Lymphs Abs: 2016 cells/uL (ref 850–3900)
MCH: 29.6 pg (ref 27.0–33.0)
MCHC: 34.1 g/dL (ref 32.0–36.0)
MCV: 86.7 fL (ref 80.0–100.0)
MPV: 10.3 fL (ref 7.5–12.5)
Monocytes Absolute: 504 cells/uL (ref 200–950)
Monocytes Relative: 8 %
Neutro Abs: 3591 cells/uL (ref 1500–7800)
Neutrophils Relative %: 57 %
Platelets: 204 10*3/uL (ref 140–400)
RBC: 4.6 MIL/uL (ref 4.20–5.80)
RDW: 14.7 % (ref 11.0–15.0)
WBC: 6.3 10*3/uL (ref 3.8–10.8)

## 2017-03-20 LAB — HEMOGLOBIN A1C
Hgb A1c MFr Bld: 5.3 % (ref <5.7)
Mean Plasma Glucose: 105 mg/dL

## 2017-03-21 ENCOUNTER — Ambulatory Visit (INDEPENDENT_AMBULATORY_CARE_PROVIDER_SITE_OTHER): Payer: Medicare Other | Admitting: Internal Medicine

## 2017-03-21 ENCOUNTER — Encounter: Payer: Self-pay | Admitting: Internal Medicine

## 2017-03-21 VITALS — BP 118/62 | HR 66 | Temp 98.4°F | Resp 12 | Ht 71.0 in | Wt 267.0 lb

## 2017-03-21 DIAGNOSIS — E669 Obesity, unspecified: Secondary | ICD-10-CM | POA: Diagnosis not present

## 2017-03-21 DIAGNOSIS — Z0000 Encounter for general adult medical examination without abnormal findings: Secondary | ICD-10-CM | POA: Diagnosis not present

## 2017-03-21 DIAGNOSIS — I10 Essential (primary) hypertension: Secondary | ICD-10-CM | POA: Diagnosis not present

## 2017-03-21 DIAGNOSIS — R739 Hyperglycemia, unspecified: Secondary | ICD-10-CM | POA: Diagnosis not present

## 2017-03-21 DIAGNOSIS — E785 Hyperlipidemia, unspecified: Secondary | ICD-10-CM | POA: Diagnosis not present

## 2017-03-21 NOTE — Progress Notes (Signed)
[facility] Provider: Ronnie Mallette L. Renato Gailseed, D.O., C.M.D.  Patient Care Team: Kermit Baloiffany L Cyle Kenyon, DO as PCP - General (Geriatric Medicine)  Extended Emergency Contact Information Primary Emergency Contact: Currie ParisAtkinson,Chuck  [place] Home Phone: [phone] Relation: Other Secondary Emergency Contact: Davis,Scott  [place] Home Phone: [phone] Relation: Other  Code Status: full code Goals of Care: Advanced Directive information Advanced Directives 03/21/2017  Does Patient Have a Medical Advance Directive? Yes  Type of Advance Directive Living will  Copy of Healthcare Power of Attorney in Chart? Yes  Would patient like information on creating a medical advance directive? -  Pre-existing out of facility DNR order (yellow form or pink MOST form) -    Chief Complaint  Patient presents with  . Annual Exam    Yearly follow-up, EKG, MMSE(28/30, passed clock drawing), (-) depression, (-)fall risk   . Medication Refill    No refills needed today     HPI: Patient is a 71 y.o. male seen in today for an annual wellness exam.    Diligent about his meds, but not his diet.  It's been a rough winter. Not riding his bike as much.  Only played golf 4 times.  Weight has gone backwards.   Up 1.6 lbs.   See below with diet.  Depression screen [facility] 2[facility] 03/21/2017 06/14/2016 03/08/2016 09/08/2015 09/02/2014  Decreased Interest 0 0 0 0 0  Down, Depressed, Hopeless 0 0 0 0 0  PHQ - 2 Score 0 0 0 0 0    Fall Risk  03/21/2017 01/07/2017 06/14/2016 03/08/2016 09/08/2015  Falls in the past year? No No No No Yes  Number falls in past yr: - - - - 2 or more  Injury with Fall? - - - - No   MMSE - Mini Mental State Exam 03/21/2017 09/08/2015  Orientation to time 5 5  Orientation to Place 5 5  Registration 3 3  Attention/ Calculation 3 3  Recall 3 2  Language- name 2 objects 2 2  Language- repeat 1 1  Language- follow 3 step command 3 3  Language- read & follow direction 1  1  Write a sentence 1 1  Copy design 1 1  Total score 28 27  passed clock   Health Maintenance  Topic Date Due  . COLONOSCOPY  03/18/2021  . TETANUS/TDAP  02/26/2022  . INFLUENZA VACCINE  Completed  . Hepatitis C Screening  Completed  . PNA vac Low Risk Adult  Completed   Functional Status Survey: Is the patient deaf or have difficulty hearing?: No Does the patient have difficulty seeing, even when wearing glasses/contacts?: No Does the patient have difficulty concentrating, remembering, or making decisions?: No Does the patient have difficulty walking or climbing stairs?: No Does the patient have difficulty dressing or bathing?: No Does the patient have difficulty doing errands alone such as visiting a doctor's office or shopping?: No Current Exercise Habits: Home exercise routine, Type of exercise: strength training/weights;stretching, Time (Minutes): 15, Frequency (Times/Week): 7, Weekly Exercise (Minutes/Week): 105, Intensity: Mild Exercise limited by: None identified Diet? Not so good, but is better about his drink selections with more water and less sweet drinks like tea and soda Vision Screening Comments: Last eye exam 12/2016 Hearing: no changes Dentition: needs dental work  Past Medical History:  Diagnosis Date  . Atrial septal defect    corrected in 1962  . Cataracts, bilateral    "early stages"  . Hypercholesteremia   .  Hypertension    Sees Dr. Dorris Fetch  . Impaired hearing   . Impaired vision    glasses  . Low back pain   . Osteoarthritis of both knees    left knee  . Rheumatic fever    at 71 years old  . Shoulder pain    left, sees chiropracter for   . Stroke (HCC) 03/14/2008   pt states that he may have had a mild stroke 1 week after knee surgery    Past Surgical History:  Procedure Laterality Date  . APPENDECTOMY  1979  . ASD REPAIR    . ASD REPAIR    . CATARACT EXTRACTION Left    May 2017  . CATARACT EXTRACTION Right    May 2017  . KNEE  SURGERY     4 surgeries on right knee  . plantar fascititis     bilateral surgery   . TONSILLECTOMY  1954   and removal of adnoids  . TOTAL KNEE ARTHROPLASTY  2009   right knee, Dr.Randal   . TOTAL KNEE ARTHROPLASTY  01/15/2013   Procedure: TOTAL KNEE ARTHROPLASTY;  Surgeon: Thera Flake., MD;  Location: [facility] OR;  Service: Orthopedics;  Laterality: Left;  left total knee arthroplasty    Family History  Problem Relation Age of Onset  . Adopted: Yes  . Heart attack Father   . Pancreatic cancer Brother     Social History   Social History  . Marital status: Divorced    Spouse name: N/A  . Number of children: N/A  . Years of education: N/A   Social History Main Topics  . Smoking status: Never Smoker  . Smokeless tobacco: Never Used  . Alcohol use 1.2 oz/week    2 Standard drinks or equivalent per week     Comment: occasional use  . Drug use: No  . Sexual activity: Not Currently   Other Topics Concern  . None   Social History Narrative  . None    reports that he has never smoked. He has never used smokeless tobacco. He reports that he drinks about 1.2 oz of alcohol per week . He reports that he does not use drugs.  No Known Allergies  Allergies as of 03/21/2017   No Known Allergies     Medication List       Accurate as of 03/21/17  9:50 AM. Always use your most recent med list.          allopurinol 100 MG tablet Commonly known as:  ZYLOPRIM Take 1 tablet (100 mg total) by mouth daily.   aspirin 81 MG tablet Take 81 mg by mouth daily.   atorvastatin 40 MG tablet Commonly known as:  LIPITOR Take 1 tablet (40 mg total) by mouth at bedtime.   lisinopril-hydrochlorothiazide 20-25 MG tablet Commonly known as:  PRINZIDE,ZESTORETIC Take 1 tablet by mouth daily.   omega-3 acid ethyl esters 1 g capsule Commonly known as:  LOVAZA Take 1 capsule (1 g total) by mouth 3 (three) times daily.        Review of Systems:  Review of Systems  Constitutional:  Negative for chills, fever and malaise/fatigue.  HENT: Positive for hearing loss. Negative for congestion.        Hearing well with hearing aides  Eyes: Negative for blurred vision.       Has new trifocals  Respiratory: Negative for cough and shortness of breath.   Cardiovascular: Negative for chest pain and palpitations.  Varicose vein more prominent on left knee, but not painful or itchy  Gastrointestinal: Negative for abdominal pain, blood in stool, constipation and melena.  Genitourinary: Positive for urgency. Negative for dysuria, flank pain, frequency and hematuria.       Seemed to have difficulty starting stream when used restroom   Musculoskeletal: Negative for falls and joint pain.       S/p b/l tka  Skin: Negative for itching and rash.  Neurological: Negative for dizziness, loss of consciousness and weakness.  Endo/Heme/Allergies: Does not bruise/bleed easily.  Psychiatric/Behavioral: Negative for depression and memory loss.    Physical Exam: Vitals:   03/21/17 0853  BP: 118/62  Pulse: 66  Resp: 12  Temp: 98.4 F (36.9 C)  TempSrc: Oral  SpO2: 98%  Weight: 267 lb (121.1 kg)  Height: 5\' 11"  (1.803 m)   Body mass index is 37.24 kg/m. Physical Exam  Constitutional: He is oriented to person, place, and time. He appears well-developed and well-nourished. No distress.  HENT:  Head: Normocephalic and atraumatic.  Right Ear: External ear normal.  Left Ear: External ear normal.  Nose: Nose normal.  Mouth/Throat: Oropharynx is clear and moist. No oropharyngeal exudate.  Eyes: Conjunctivae and EOM are normal. Pupils are equal, round, and reactive to light.  Trifocal glasses  Neck: Normal range of motion. Neck supple. No JVD present.  Cardiovascular: Normal rate, regular rhythm, normal heart sounds and intact distal pulses.   No murmur heard. Pulmonary/Chest: Effort normal and breath sounds normal. No respiratory distress.  Abdominal: Soft. Bowel sounds are normal.  He exhibits no distension. There is no tenderness.  Musculoskeletal: Normal range of motion. He exhibits no tenderness.  Scars from b/l tka; large prominent varicose vein left knee  Lymphadenopathy:    He has no cervical adenopathy.  Neurological: He is alert and oriented to person, place, and time. No cranial nerve deficit.  Skin: Skin is warm and dry. Capillary refill takes less than 2 seconds.  Psychiatric: He has a normal mood and affect. His behavior is normal. Judgment and thought content normal.    Labs reviewed: Basic Metabolic Panel:  Recent Labs  32/44/01 0816 03/19/17 1036  NA 142 139  K 4.5 4.2  CL 100 102  CO2 20 31  GLUCOSE 84 93  BUN 23 20  CREATININE 0.98 1.02  CALCIUM 9.0 9.1   Liver Function Tests:  Recent Labs  03/19/17 1036  AST 18  ALT 20  ALKPHOS 64  BILITOT 0.8  PROT 6.7  ALBUMIN 4.0   No results for input(s): LIPASE, AMYLASE in the last 8760 hours. No results for input(s): AMMONIA in the last 8760 hours. CBC:  Recent Labs  03/19/17 1036  WBC 6.3  NEUTROABS 3,591  HGB 13.6  HCT 39.9  MCV 86.7  PLT 204   Lipid Panel:  Recent Labs  06/12/16 0816 03/19/17 1036  CHOL 117 112  HDL 33* 30*  LDLCALC 63 65  TRIG 107 85  CHOLHDL 3.5 3.7   Lab Results  Component Value Date   HGBA1C 5.3 03/19/2017    Assessment/Plan 1. Annual physical exam -performed today - COMPLETE METABOLIC PANEL WITH GFR; Future - Hemoglobin A1c; Future - Lipid panel; Future  2. Hyperlipidemia LDL goal <100 - lipids at goal with current therapy and working on diet and exercise - Lipid panel; Future  3. Hyperglycemia - cont to work on diet and exercise regimen, last hba1c was in normal range - Hemoglobin A1c; Future  4. Obesity (BMI  35.0-39.9 without comorbidity) - has gained weight instead of losing as planned, but bloodwork has improved - COMPLETE METABOLIC PANEL WITH GFR; Future - Hemoglobin A1c; Future - Lipid panel; Future  5. Essential  hypertension, benign - bp at goal, cont same regimen and monitor - COMPLETE METABOLIC PANEL WITH GFR; Future  6. Medicare annual wellness visit, subsequent -performed today, see above  Labs/tests ordered:   Orders Placed This Encounter  Procedures  . COMPLETE METABOLIC PANEL WITH GFR    [facility]    Standing Status:   Future    Standing Expiration Date:   03/21/2018  . Hemoglobin A1c    Standing Status:   Future    Standing Expiration Date:   03/21/2018  . Lipid panel    Standing Status:   Future    Standing Expiration Date:   03/21/2018    Order Specific Question:   Has the patient fasted?    Answer:   Yes   Next appt:  6 mos for med mgt with labs before  Hyatt Capobianco L. Jaymen Fetch, D.O. Geriatrics [facility] [address], [place] [address] Cell Phone (Mon-Fri 8am-5pm):  [phone] On Call:  [phone] & follow prompts after 5pm & weekends Office Phone:  [phone] Office Fax:  [phone]

## 2017-03-21 NOTE — Addendum Note (Signed)
Addended by: Maurice SmallBEATTY, Jolanda Mccann C on: 03/21/2017 10:24 AM   Modules accepted: Orders

## 2017-04-16 ENCOUNTER — Other Ambulatory Visit: Payer: Self-pay | Admitting: *Deleted

## 2017-04-16 DIAGNOSIS — E785 Hyperlipidemia, unspecified: Secondary | ICD-10-CM

## 2017-04-16 MED ORDER — ATORVASTATIN CALCIUM 40 MG PO TABS: 40.0000 mg | ORAL_TABLET | Freq: Every day | ORAL | 3 refills | Status: DC

## 2017-04-16 NOTE — Telephone Encounter (Signed)
Patient requested refill sent to [facility].

## 2017-06-03 ENCOUNTER — Encounter: Payer: Self-pay | Admitting: Internal Medicine

## 2017-09-22 ENCOUNTER — Encounter: Payer: Self-pay | Admitting: Nurse Practitioner

## 2017-09-22 ENCOUNTER — Ambulatory Visit (INDEPENDENT_AMBULATORY_CARE_PROVIDER_SITE_OTHER): Payer: Medicare Other | Admitting: Nurse Practitioner

## 2017-09-22 VITALS — BP 120/68 | HR 64 | Temp 98.2°F | Resp 18 | Ht 71.0 in | Wt 262.2 lb

## 2017-09-22 DIAGNOSIS — J40 Bronchitis, not specified as acute or chronic: Secondary | ICD-10-CM | POA: Diagnosis not present

## 2017-09-22 NOTE — Patient Instructions (Signed)
To use mucinex DM 1-2 tablet by mouth twice daily for cough and congestion take with full glass of water for 1 week then as needed Call if symptoms worsen  To keep follow up appt   Acute Bronchitis, Adult Acute bronchitis is when air tubes (bronchi) in the lungs suddenly get swollen. The condition can make it hard to breathe. It can also cause these symptoms:  A cough.  Coughing up clear, yellow, or green mucus.  Wheezing.  Chest congestion.  Shortness of breath.  A fever.  Body aches.  Chills.  A sore throat.  Follow these instructions at home: Medicines  Take over-the-counter and prescription medicines only as told by your doctor.  If you were prescribed an antibiotic medicine, take it as told by your doctor. Do not stop taking the antibiotic even if you start to feel better. General instructions  Rest.  Drink enough fluids to keep your pee (urine) clear or pale yellow.  Avoid smoking and secondhand smoke. If you smoke and you need help quitting, ask your doctor. Quitting will help your lungs heal faster.  Use an inhaler, cool mist vaporizer, or humidifier as told by your doctor.  Keep all follow-up visits as told by your doctor. This is important. How is this prevented? To lower your risk of getting this condition again:  Wash your hands often with soap and water. If you cannot use soap and water, use hand sanitizer.  Avoid contact with people who have cold symptoms.  Try not to touch your hands to your mouth, nose, or eyes.  Make sure to get the flu shot every year.  Contact a doctor if:  Your symptoms do not get better in 2 weeks. Get help right away if:  You cough up blood.  You have chest pain.  You have very bad shortness of breath.  You become dehydrated.  You faint (pass out) or keep feeling like you are going to pass out.  You keep throwing up (vomiting).  You have a very bad headache.  Your fever or chills gets worse. This  information is not intended to replace advice given to you by your health care provider. Make sure you discuss any questions you have with your health care provider. Document Released: 06/03/2008 Document Revised: 07/24/2016 Document Reviewed: 06/05/2016 Elsevier Interactive Patient Education  2017 [employer].

## 2017-09-22 NOTE — Progress Notes (Signed)
Careteam: Patient Care Team: Kermit Balo, DO as PCP - General (Geriatric Medicine)  Advanced Directive information Does Patient Have a Medical Advance Directive?: No, Would patient like information on creating a medical advance directive?: Yes (MAU/Ambulatory/Procedural Areas - Information given) No Known Allergies  Chief Complaint  Patient presents with  . Acute Visit    Pt is being seen due to a cough x 1 week, nasal/chest congestion for several days.      HPI: Patient is a 71 y.o. male seen in the office today due to chest congestion. Does not feel bad. Has had a dry cough which started last week.  Trouble with sinuses since last week.  Possible fever Friday and Saturday because he was so sleepy.  Gained 6 lbs since Friday.  Denies shortness of breath. No edema Has increased liquids.  Took night time Nyquil which helped his congestion and cough.  Feeling better today. Increase energy Did his regular workout this morning without problem    Review of Systems:  Review of Systems  Constitutional: Negative for chills, fever and malaise/fatigue.  HENT: Positive for congestion. Negative for sinus pain and sore throat.   Respiratory: Positive for cough and wheezing. Negative for sputum production and shortness of breath.   Cardiovascular: Negative for chest pain and palpitations.  Neurological: Negative for dizziness, weakness and headaches.    Past Medical History:  Diagnosis Date  . Atrial septal defect    corrected in 1962  . Cataracts, bilateral    "early stages"  . Hypercholesteremia   . Hypertension    Sees Dr. Dorris Fetch  . Impaired hearing   . Impaired vision    glasses  . Low back pain   . Osteoarthritis of both knees    left knee  . Rheumatic fever    at 71 years old  . Shoulder pain    left, sees chiropracter for   . Stroke (HCC) 03/14/2008   pt states that he may have had a mild stroke 1 week after knee surgery   Past Surgical History:    Procedure Laterality Date  . APPENDECTOMY  1979  . ASD REPAIR    . ASD REPAIR    . CATARACT EXTRACTION Left    May 2017  . CATARACT EXTRACTION Right    May 2017  . KNEE SURGERY     4 surgeries on right knee  . plantar fascititis     bilateral surgery   . TONSILLECTOMY  1954   and removal of adnoids  . TOTAL KNEE ARTHROPLASTY  2009   right knee, Dr.Randal   . TOTAL KNEE ARTHROPLASTY  01/15/2013   Procedure: TOTAL KNEE ARTHROPLASTY;  Surgeon: Thera Flake., MD;  Location: [facility] OR;  Service: Orthopedics;  Laterality: Left;  left total knee arthroplasty   Social History:   reports that he has never smoked. He has never used smokeless tobacco. He reports that he drinks about 1.2 oz of alcohol per week . He reports that he does not use drugs.  Family History  Problem Relation Age of Onset  . Adopted: Yes  . Heart attack Father   . Pancreatic cancer Brother     Medications: Patient's Medications  New Prescriptions   No medications on file  Previous Medications   ALLOPURINOL (ZYLOPRIM) 100 MG TABLET    Take 1 tablet (100 mg total) by mouth daily.   ASPIRIN 81 MG TABLET    Take 81 mg by mouth daily.  ATORVASTATIN (LIPITOR) 40 MG TABLET    Take 1 tablet (40 mg total) by mouth at bedtime.   LISINOPRIL-HYDROCHLOROTHIAZIDE (PRINZIDE,ZESTORETIC) 20-25 MG TABLET    Take 1 tablet by mouth daily.   OMEGA-3 ACID ETHYL ESTERS (LOVAZA) 1 G CAPSULE    Take 1 capsule (1 g total) by mouth 3 (three) times daily.  Modified Medications   No medications on file  Discontinued Medications   No medications on file     Physical Exam:  Vitals:   09/22/17 1135  BP: 120/68  Pulse: 64  Resp: 18  Temp: 98.2 F (36.8 C)  TempSrc: Oral  SpO2: 97%  Weight: 262 lb 3.2 oz (118.9 kg)  Height:  (1.803 m)   Body mass index is 36.57 kg/m.  Physical Exam  Constitutional: He is oriented to person, place, and time. He appears well-developed and well-nourished. No distress.  HENT:  Head:  Normocephalic and atraumatic.  Right Ear: External ear normal.  Left Ear: External ear normal.  Nose: Nose normal.  Mouth/Throat: Oropharynx is clear and moist. No oropharyngeal exudate.  Hearing aides  Eyes: Pupils are equal, round, and reactive to light. Conjunctivae and EOM are normal.  New glasses  Neck: Normal range of motion. Neck supple. No thyromegaly present.  Cardiovascular: Normal rate, regular rhythm and normal heart sounds.   Pulmonary/Chest: Effort normal and breath sounds normal. No respiratory distress.  Musculoskeletal: He exhibits no edema.  Lymphadenopathy:    He has no cervical adenopathy.  Neurological: He is alert and oriented to person, place, and time.  Skin: Skin is warm and dry.  Psychiatric: He has a normal mood and affect.   Labs reviewed: Basic Metabolic Panel:  Recent Labs  40/98/11 1036  NA 139  K 4.2  CL 102  CO2 31  GLUCOSE 93  BUN 20  CREATININE 1.02  CALCIUM 9.1   Liver Function Tests:  Recent Labs  03/19/17 1036  AST 18  ALT 20  ALKPHOS 64  BILITOT 0.8  PROT 6.7  ALBUMIN 4.0   No results for input(s): LIPASE, AMYLASE in the last 8760 hours. No results for input(s): AMMONIA in the last 8760 hours. CBC:  Recent Labs  03/19/17 1036  WBC 6.3  NEUTROABS 3,591  HGB 13.6  HCT 39.9  MCV 86.7  PLT 204   Lipid Panel:  Recent Labs  03/19/17 1036  CHOL 112  HDL 30*  LDLCALC 65  TRIG 85  CHOLHDL 3.7   TSH: No results for input(s): TSH in the last 8760 hours. A1C: Lab Results  Component Value Date   HGBA1C 5.3 03/19/2017     Assessment/Plan 1. Bronchitis Overall doing better, most likely viral.  supportive measures at this time. To cont with increase hydration and encouraged proper nutrition.  -mucinex DM by mouth twice daily with full glass of water -return precautions discussed  Next appt: to keep follow up appt next week.  Janene Harvey. Biagio Borg  [facility] [phone] 8 am - 5 pm) [phone] (after hours)

## 2017-09-23 ENCOUNTER — Other Ambulatory Visit: Payer: Self-pay | Admitting: Internal Medicine

## 2017-09-24 ENCOUNTER — Other Ambulatory Visit: Payer: Medicare Other

## 2017-09-24 DIAGNOSIS — Z0000 Encounter for general adult medical examination without abnormal findings: Secondary | ICD-10-CM | POA: Diagnosis not present

## 2017-09-24 DIAGNOSIS — I10 Essential (primary) hypertension: Secondary | ICD-10-CM

## 2017-09-24 DIAGNOSIS — E785 Hyperlipidemia, unspecified: Secondary | ICD-10-CM

## 2017-09-24 DIAGNOSIS — R739 Hyperglycemia, unspecified: Secondary | ICD-10-CM | POA: Diagnosis not present

## 2017-09-24 DIAGNOSIS — E669 Obesity, unspecified: Secondary | ICD-10-CM

## 2017-09-25 LAB — COMPLETE METABOLIC PANEL WITH GFR
AG Ratio: 1.4 (calc) (ref 1.0–2.5)
ALT: 16 U/L (ref 9–46)
AST: 17 U/L (ref 10–35)
Albumin: 4 g/dL (ref 3.6–5.1)
Alkaline phosphatase (APISO): 69 U/L (ref 40–115)
BUN: 22 mg/dL (ref 7–25)
CO2: 30 mmol/L (ref 20–32)
Calcium: 8.9 mg/dL (ref 8.6–10.3)
Chloride: 101 mmol/L (ref 98–110)
Creat: 0.97 mg/dL (ref 0.70–1.18)
GFR, Est African American: 91 mL/min/{1.73_m2} (ref >=60)
GFR, Est Non African American: 78 mL/min/{1.73_m2} (ref >=60)
Globulin: 2.8 g/dL (calc) (ref 1.9–3.7)
Glucose, Bld: 89 mg/dL (ref 65–99)
Potassium: 4.2 mmol/L (ref 3.5–5.3)
Sodium: 138 mmol/L (ref 135–146)
Total Bilirubin: 0.8 mg/dL (ref 0.2–1.2)
Total Protein: 6.8 g/dL (ref 6.1–8.1)

## 2017-09-25 LAB — HEMOGLOBIN A1C
Hgb A1c MFr Bld: 5.5 % of total Hgb (ref <5.7)
Mean Plasma Glucose: 111 (calc)
eAG (mmol/L): 6.2 (calc)

## 2017-09-25 LAB — LIPID PANEL
Cholesterol: 118 mg/dL (ref <200)
HDL: 32 mg/dL — ABNORMAL LOW (ref >40)
LDL Cholesterol (Calc): 70 mg/dL (calc)
Non-HDL Cholesterol (Calc): 86 mg/dL (calc) (ref <130)
Total CHOL/HDL Ratio: 3.7 (calc) (ref <5.0)
Triglycerides: 80 mg/dL (ref <150)

## 2017-09-26 ENCOUNTER — Ambulatory Visit: Payer: Medicare Other | Admitting: Internal Medicine

## 2017-09-29 ENCOUNTER — Ambulatory Visit (INDEPENDENT_AMBULATORY_CARE_PROVIDER_SITE_OTHER): Payer: Medicare Other | Admitting: Internal Medicine

## 2017-09-29 ENCOUNTER — Encounter: Payer: Self-pay | Admitting: Internal Medicine

## 2017-09-29 VITALS — BP 132/78 | HR 61 | Temp 98.3°F | Wt 260.0 lb

## 2017-09-29 DIAGNOSIS — R21 Rash and other nonspecific skin eruption: Secondary | ICD-10-CM | POA: Diagnosis not present

## 2017-09-29 DIAGNOSIS — Z23 Encounter for immunization: Secondary | ICD-10-CM | POA: Diagnosis not present

## 2017-09-29 DIAGNOSIS — R739 Hyperglycemia, unspecified: Secondary | ICD-10-CM | POA: Diagnosis not present

## 2017-09-29 DIAGNOSIS — I10 Essential (primary) hypertension: Secondary | ICD-10-CM

## 2017-09-29 DIAGNOSIS — Z7189 Other specified counseling: Secondary | ICD-10-CM

## 2017-09-29 DIAGNOSIS — Z8739 Personal history of other diseases of the musculoskeletal system and connective tissue: Secondary | ICD-10-CM | POA: Diagnosis not present

## 2017-09-29 DIAGNOSIS — R040 Epistaxis: Secondary | ICD-10-CM | POA: Diagnosis not present

## 2017-09-29 DIAGNOSIS — M1811 Unilateral primary osteoarthritis of first carpometacarpal joint, right hand: Secondary | ICD-10-CM | POA: Diagnosis not present

## 2017-09-29 DIAGNOSIS — E785 Hyperlipidemia, unspecified: Secondary | ICD-10-CM

## 2017-09-29 DIAGNOSIS — E669 Obesity, unspecified: Secondary | ICD-10-CM | POA: Diagnosis not present

## 2017-09-29 MED ORDER — NYSTATIN-TRIAMCINOLONE 100000-0.1 UNIT/GM-% EX OINT: 1.0000 "application " | TOPICAL_OINTMENT | Freq: Two times a day (BID) | CUTANEOUS | 3 refills | Status: DC
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
## 2017-11-12 ENCOUNTER — Telehealth: Payer: Self-pay

## 2017-11-12 ENCOUNTER — Encounter: Payer: Self-pay | Admitting: Nurse Practitioner

## 2017-11-12 ENCOUNTER — Ambulatory Visit (INDEPENDENT_AMBULATORY_CARE_PROVIDER_SITE_OTHER): Payer: Medicare Other | Admitting: Nurse Practitioner

## 2017-11-12 ENCOUNTER — Ambulatory Visit
Admission: RE | Admit: 2017-11-12 | Discharge: 2017-11-12 | Disposition: A | Discharge status: Home / Self Care | Payer: Self-pay | Source: Ambulatory Visit | Attending: Nurse Practitioner | Admitting: Nurse Practitioner

## 2017-11-12 VITALS — BP 128/84 | HR 60 | Temp 98.3°F | Resp 17 | Ht 71.0 in | Wt 264.0 lb

## 2017-11-12 DIAGNOSIS — M25531 Pain in right wrist: Secondary | ICD-10-CM | POA: Diagnosis not present

## 2017-11-12 NOTE — Progress Notes (Signed)
Careteam: Patient Care Team: [name], Tiffany L, DO as PCP - General (Geriatric Medicine)  Advanced Directive information Does Patient Have a Medical Advance Directive?: Yes, Would patient like information on creating a medical advance directive?: No - Patient declined, Type of Advance Directive: Out of facility DNR (pink MOST or yellow form), Pre-existing out of facility DNR order (yellow form or pink MOST form): Yellow form placed in chart (order not valid for inpatient use)  No Known Allergies  Chief Complaint  Patient presents with  . Acute Visit    Pt is being seen due to right hand pain. He think he may have broken a bone in his hand during a firm handshake. He felt something pop and has had pain every since (10 days ago)     HPI: Patient is a 71 y.o. male seen in the office today due to pain in right hand after handshake. Notes that hand was "annoying" for several day but painful this morning.  Went to a football game about 10 days ago and had a firm handshake and felt something but no pain afterwards however 2 days later someone else shook his hand and it was painful. 4 days ago played a round of golf.2 days ago went to the gym and did his normal routine, noticed some tenderness but not enough to make him stop his routine.  This morning doing his exercise routine was painful and then carrying milk jug was painful.  No falls, no injury noted.  No bruising or swelling.  Good ROM, able to move all digits but feels weak.    Review of Systems:  Review of Systems  Constitutional: Negative for chills and fever.  Musculoskeletal:       Pain to right wrist   Skin: Negative for itching and rash.  Neurological: Negative for tingling.    Past Medical History:  Diagnosis Date  . Atrial septal defect    corrected in 1962  . Cataracts, bilateral    "early stages"  . Hypercholesteremia   . Hypertension    Sees Dr. Dorris Fetchiffany Reid  . Impaired hearing   . Impaired vision    glasses   . Low back pain   . Osteoarthritis of both knees    left knee  . Rheumatic fever    at 71 years old  . Shoulder pain    left, sees chiropracter for   . Stroke (HCC) 03/14/2008   pt states that he may have had a mild stroke 1 week after knee surgery   Past Surgical History:  Procedure Laterality Date  . APPENDECTOMY  1979  . ASD REPAIR    . ASD REPAIR    . CATARACT EXTRACTION Left    May 2017  . CATARACT EXTRACTION Right    May 2017  . KNEE SURGERY     4 surgeries on right knee  . plantar fascititis     bilateral surgery   . TONSILLECTOMY  1954   and removal of adnoids  . TOTAL KNEE ARTHROPLASTY  2009   right knee, Dr.Randal    Social History:   reports that  has never smoked. he has never used smokeless tobacco. He reports that he drinks about 1.2 oz of alcohol per week. He reports that he does not use drugs.  Family History  Adopted: Yes  Problem Relation Age of Onset  . Heart attack Father   . Pancreatic cancer Brother     Medications:   Medication List  Accurate as of 11/12/17 11:51 AM. Always use your most recent med list.          allopurinol 100 MG tablet Commonly known as:  ZYLOPRIM Take 1 tablet (100 mg total) by mouth daily.   aspirin 81 MG tablet   atorvastatin 40 MG tablet Commonly known as:  LIPITOR Take 1 tablet (40 mg total) by mouth at bedtime.   lisinopril-hydrochlorothiazide 20-25 MG tablet Commonly known as:  PRINZIDE,ZESTORETIC TAKE 1 TABLET BY MOUTH DAILY   omega-3 acid ethyl esters 1 g capsule Commonly known as:  LOVAZA Take 1 capsule (1 g total) by mouth 3 (three) times daily.        Physical Exam:  Vitals:   11/12/17 1141  BP: 128/84  Pulse: 60  Resp: 17  Temp: 98.3 F (36.8 C)  TempSrc: Oral  SpO2: 97%  Weight: 264 lb (119.7 kg)  Height: 5\' 11"  (1.803 m)   Body mass index is 36.82 kg/m.  Physical Exam  Constitutional: He is oriented to person, place, and time. He appears well-developed and  well-nourished.  Cardiovascular: Normal rate, regular rhythm and normal heart sounds.  Pulmonary/Chest: Effort normal and breath sounds normal.  Musculoskeletal:       Right wrist: He exhibits tenderness and bony tenderness. He exhibits normal range of motion, no swelling, no effusion, no crepitus and no deformity.       Arms: Point Tenderness noted to distal ulna. Normal ROM but tenderness noted with flexion   Neurological: He is alert and oriented to person, place, and time.    Labs reviewed: Basic Metabolic Panel: Recent Labs    03/19/17 1036 09/24/17 0856  NA 139 138  K 4.2 4.2  CL 102 101  CO2 31 30  GLUCOSE 93 89  BUN 20 22  CREATININE 1.02 0.97  CALCIUM 9.1 8.9   Liver Function Tests: Recent Labs    03/19/17 1036 09/24/17 0856  AST 18 17  ALT 20 16  ALKPHOS 64  --   BILITOT 0.8 0.8  PROT 6.7 6.8  ALBUMIN 4.0  --    No results for input(s): LIPASE, AMYLASE in the last 8760 hours. No results for input(s): AMMONIA in the last 8760 hours. CBC: Recent Labs    03/19/17 1036  WBC 6.3  NEUTROABS 3,591  HGB 13.6  HCT 39.9  MCV 86.7  PLT 204   Lipid Panel: Recent Labs    03/19/17 1036 09/24/17 0856  CHOL 112 118  HDL 30* 32*  LDLCALC 65  --   TRIG 85 80  CHOLHDL 3.7 3.7   TSH: No results for input(s): TSH in the last 8760 hours. A1C: Lab Results  Component Value Date   HGBA1C 5.5 09/24/2017     Assessment/Plan 1. Right wrist pain No acute injury noted To use ALEVE 1 tablet by mouth twice daily for 1 week Limit movement to wrist  No heavy lifting Ice as needed Will get xray to rule out fracture - DG Wrist Complete Right; Future -to notify if symptoms worsen or fail to improved  Jessica K. Biagio BorgEubanks, AGNP  [facility] [phone](Monday-Friday 8 am - 5 pm) [phone] (after hours)

## 2017-11-12 NOTE — Patient Instructions (Signed)
To use ALEVE 1 tablet by mouth twice daily for 1 week Limit movement to wrist No heavy lifting Ice as needed Will get xray

## 2017-11-12 NOTE — Telephone Encounter (Signed)
I called patient to see if he would like to come in at 10:30 rather than 11:30 this morning. I left a message for patient to call the office.

## 2017-12-24 ENCOUNTER — Other Ambulatory Visit: Payer: Self-pay | Admitting: *Deleted

## 2017-12-24 MED ORDER — LISINOPRIL-HYDROCHLOROTHIAZIDE 20-25 MG PO TABS: 1.0000 | ORAL_TABLET | Freq: Every day | ORAL | 2 refills | Status: DC

## 2017-12-24 NOTE — Telephone Encounter (Signed)
[facility] 

## 2018-01-28 ENCOUNTER — Other Ambulatory Visit: Payer: Self-pay | Admitting: *Deleted

## 2018-01-28 DIAGNOSIS — E785 Hyperlipidemia, unspecified: Secondary | ICD-10-CM

## 2018-01-28 MED ORDER — OMEGA-3-ACID ETHYL ESTERS 1 G PO CAPS: 1.0000 g | ORAL_CAPSULE | Freq: Three times a day (TID) | ORAL | 1 refills | Status: DC

## 2018-01-28 NOTE — Telephone Encounter (Signed)
[facility] 

## 2018-03-04 ENCOUNTER — Other Ambulatory Visit: Payer: Self-pay | Admitting: *Deleted

## 2018-03-04 DIAGNOSIS — E785 Hyperlipidemia, unspecified: Secondary | ICD-10-CM

## 2018-03-04 MED ORDER — ATORVASTATIN CALCIUM 40 MG PO TABS: 40.0000 mg | ORAL_TABLET | Freq: Every day | ORAL | 0 refills | Status: DC

## 2018-03-04 NOTE — Telephone Encounter (Signed)
Patient requested refill to be sent to [facility]

## 2018-03-30 ENCOUNTER — Ambulatory Visit: Payer: Medicare Other | Admitting: Internal Medicine

## 2018-04-06 ENCOUNTER — Encounter: Payer: Medicare Other | Admitting: Internal Medicine

## 2018-04-16 ENCOUNTER — Other Ambulatory Visit: Payer: Self-pay

## 2018-04-16 ENCOUNTER — Other Ambulatory Visit: Payer: Medicare Other

## 2018-04-16 DIAGNOSIS — R040 Epistaxis: Secondary | ICD-10-CM

## 2018-04-16 DIAGNOSIS — E785 Hyperlipidemia, unspecified: Secondary | ICD-10-CM | POA: Diagnosis not present

## 2018-04-16 DIAGNOSIS — R739 Hyperglycemia, unspecified: Secondary | ICD-10-CM

## 2018-04-16 DIAGNOSIS — I10 Essential (primary) hypertension: Secondary | ICD-10-CM | POA: Diagnosis not present

## 2018-04-16 DIAGNOSIS — E669 Obesity, unspecified: Secondary | ICD-10-CM | POA: Diagnosis not present

## 2018-04-17 LAB — CBC WITH DIFFERENTIAL/PLATELET
Basophils Absolute: 50 cells/uL (ref 0–200)
Basophils Relative: 0.8 %
Eosinophils Absolute: 130 cells/uL (ref 15–500)
Eosinophils Relative: 2.1 %
HCT: 39.4 % (ref 38.5–50.0)
Hemoglobin: 13.3 g/dL (ref 13.2–17.1)
Lymphs Abs: 1488 cells/uL (ref 850–3900)
MCH: 29 pg (ref 27.0–33.0)
MCHC: 33.8 g/dL (ref 32.0–36.0)
MCV: 86 fL (ref 80.0–100.0)
MPV: 11.4 fL (ref 7.5–12.5)
Monocytes Relative: 5.5 %
Neutro Abs: 4191 cells/uL (ref 1500–7800)
Neutrophils Relative %: 67.6 %
Platelets: 203 10*3/uL (ref 140–400)
RBC: 4.58 10*6/uL (ref 4.20–5.80)
RDW: 14 % (ref 11.0–15.0)
Total Lymphocyte: 24 %
WBC mixed population: 341 cells/uL (ref 200–950)
WBC: 6.2 10*3/uL (ref 3.8–10.8)

## 2018-04-17 LAB — COMPLETE METABOLIC PANEL WITH GFR
AG Ratio: 1.5 (calc) (ref 1.0–2.5)
ALT: 16 U/L (ref 9–46)
AST: 19 U/L (ref 10–35)
Albumin: 4 g/dL (ref 3.6–5.1)
Alkaline phosphatase (APISO): 59 U/L (ref 40–115)
BUN: 22 mg/dL (ref 7–25)
CO2: 28 mmol/L (ref 20–32)
Calcium: 8.9 mg/dL (ref 8.6–10.3)
Chloride: 102 mmol/L (ref 98–110)
Creat: 1.01 mg/dL (ref 0.70–1.18)
GFR, Est African American: 86 mL/min/{1.73_m2} (ref >=60)
GFR, Est Non African American: 74 mL/min/{1.73_m2} (ref >=60)
Globulin: 2.6 g/dL (calc) (ref 1.9–3.7)
Glucose, Bld: 90 mg/dL (ref 65–99)
Potassium: 4 mmol/L (ref 3.5–5.3)
Sodium: 138 mmol/L (ref 135–146)
Total Bilirubin: 0.7 mg/dL (ref 0.2–1.2)
Total Protein: 6.6 g/dL (ref 6.1–8.1)

## 2018-04-17 LAB — LIPID PANEL
Cholesterol: 123 mg/dL (ref <200)
HDL: 38 mg/dL — ABNORMAL LOW (ref >40)
LDL Cholesterol (Calc): 69 mg/dL (calc)
Non-HDL Cholesterol (Calc): 85 mg/dL (calc) (ref <130)
Total CHOL/HDL Ratio: 3.2 (calc) (ref <5.0)
Triglycerides: 75 mg/dL (ref <150)

## 2018-04-17 LAB — HEMOGLOBIN A1C
Hgb A1c MFr Bld: 5.5 % of total Hgb (ref <5.7)
Mean Plasma Glucose: 111 (calc)
eAG (mmol/L): 6.2 (calc)

## 2018-04-20 ENCOUNTER — Other Ambulatory Visit: Payer: Medicare Other

## 2018-04-23 ENCOUNTER — Encounter: Payer: Self-pay | Admitting: Internal Medicine

## 2018-04-23 ENCOUNTER — Ambulatory Visit (INDEPENDENT_AMBULATORY_CARE_PROVIDER_SITE_OTHER): Payer: Medicare Other | Admitting: Internal Medicine

## 2018-04-23 VITALS — BP 132/70 | HR 61 | Temp 98.7°F | Ht 71.0 in | Wt 254.0 lb

## 2018-04-23 DIAGNOSIS — E785 Hyperlipidemia, unspecified: Secondary | ICD-10-CM

## 2018-04-23 DIAGNOSIS — B354 Tinea corporis: Secondary | ICD-10-CM

## 2018-04-23 DIAGNOSIS — I10 Essential (primary) hypertension: Secondary | ICD-10-CM | POA: Diagnosis not present

## 2018-04-23 DIAGNOSIS — Z0000 Encounter for general adult medical examination without abnormal findings: Secondary | ICD-10-CM | POA: Diagnosis not present

## 2018-04-23 DIAGNOSIS — R739 Hyperglycemia, unspecified: Secondary | ICD-10-CM

## 2018-04-23 DIAGNOSIS — E669 Obesity, unspecified: Secondary | ICD-10-CM | POA: Diagnosis not present

## 2018-04-23 NOTE — Patient Instructions (Signed)
Try over the counter lotrisone cream on the left groin rash.    Continue to work on improving your diet and riding your bike.  It's great that you've lost 10 lbs.  Gradual weight loss is the best.

## 2018-04-23 NOTE — Progress Notes (Signed)
Provider:  Gwenith Spitziffany L. Renato Gailseed, D.O., C.M.D. Location:   [facility]   Place of Service:   clinic  Previous PCP: [name], Omer Monter L, DO Patient Care Team: [name], Zarra Geffert L, DO as PCP - General (Geriatric Medicine)  Extended Emergency Contact Information Primary Emergency Contact: Currie ParisAtkinson,Chuck  [place] Home Phone: [phone] Relation: Other Secondary Emergency Contact: Davis,Scott  [place] Home Phone: [phone] Relation: Other  Code Status: DNR Goals of Care: Advanced Directive information Advanced Directives 04/23/2018  Does Patient Have a Medical Advance Directive? Yes  Type of Estate agentAdvance Directive Healthcare Power of [place];Living will;Out of facility DNR (pink MOST or yellow form)  Copy of Healthcare Power of Attorney in Chart? No - copy requested  Would patient like information on creating a medical advance directive? -  Pre-existing out of facility DNR order (yellow form or pink MOST form) Yellow form placed in chart (order not valid for inpatient use)      Chief Complaint  Patient presents with  . Annual Exam    CPE    HPI: Patient is a 72 y.o. male seen today for an annual physical exam.  EKG was done with first degree av block, no acute ischemia or infarct.  HR 67bpm.  He's had a couple of stress tests for surgeries and no problems.  He had his ASD repair at 72 yo.  He'd had rheumatic heart disease as a child also.  He'd been told he wouldn't live but a few years.    Tells me about [place] around with his buddy who has lewy body dementia.    He lost 17 lbs, but he says it was a false weight loss b/c he lost a lot of muscle mass from not riding his bike.  He's been much more focused on building his leg strength and muscle mass since.  He's still down 10 lbs even after the trip.  He's been able to take some knotches up on his belt and he needs to walk [place] goes [place]    Ointment was going to be $191 for his  left medial thigh rash which reddens occasionally, but does not itch or hurt.    MMSE - Mini Mental State Exam 04/23/2018 03/21/2017 09/08/2015  Orientation to time 5 5 5   Orientation to Place 5 5 5   Registration 3 3 3   Attention/ Calculation 5 3 3   Recall 3 3 2   Language- name 2 objects 2 2 2   Language- repeat 1 1 1   Language- follow 3 step command 3 3 3   Language- read & follow direction 1 1 1   Write a sentence 1 1 1   Copy design 1 1 1   Total score 30 28 27     Past Medical History:  Diagnosis Date  . Atrial septal defect    corrected in 1962  . Cataracts, bilateral    "early stages"  . Hypercholesteremia   . Hypertension    Sees Dr. Dorris Fetchiffany Reid  . Impaired hearing   . Impaired vision    glasses  . Low back pain   . Osteoarthritis of both knees    left knee  . Rheumatic fever    at 72 years old  . Shoulder pain    left, sees chiropracter for   . Stroke (HCC) 03/14/2008   pt states that he may have had a mild stroke 1 week after knee surgery   Past Surgical History:  Procedure Laterality Date  .  APPENDECTOMY  1979  . ASD REPAIR    . ASD REPAIR    . CATARACT EXTRACTION Left    May 2017  . CATARACT EXTRACTION Right    May 2017  . KNEE SURGERY     4 surgeries on right knee  . plantar fascititis     bilateral surgery   . TONSILLECTOMY  1954   and removal of adnoids  . TOTAL KNEE ARTHROPLASTY  2009   right knee, Dr.Randal   . TOTAL KNEE ARTHROPLASTY  01/15/2013   Procedure: TOTAL KNEE ARTHROPLASTY;  Surgeon: Thera Flake., MD;  Location: [facility] OR;  Service: Orthopedics;  Laterality: Left;  left total knee arthroplasty    reports that he has never smoked. He has never used smokeless tobacco. He reports that he drinks about 1.2 oz of alcohol per week. He reports that he does not use drugs.  Functional Status Survey:  fully independent and travels all around  Family History  Adopted: Yes  Problem Relation Age of Onset  . Heart attack Father   . Pancreatic cancer  Brother     Health Maintenance  Topic Date Due  . INFLUENZA VACCINE  07/30/2018  . COLONOSCOPY  03/18/2021  . TETANUS/TDAP  02/26/2022  . Hepatitis C Screening  Completed  . PNA vac Low Risk Adult  Completed    No Known Allergies  Outpatient Encounter Medications as of 04/23/2018  Medication Sig  . aspirin 81 MG tablet Take 81 mg by mouth daily.  Marland Kitchen atorvastatin (LIPITOR) 40 MG tablet Take 1 tablet (40 mg total) by mouth at bedtime.  Marland Kitchen lisinopril-hydrochlorothiazide (PRINZIDE,ZESTORETIC) 20-25 MG tablet Take 1 tablet by mouth daily.  Marland Kitchen omega-3 acid ethyl esters (LOVAZA) 1 g capsule Take 1 capsule (1 g total) by mouth 3 (three) times daily.  . [DISCONTINUED] allopurinol (ZYLOPRIM) 100 MG tablet Take 1 tablet (100 mg total) by mouth daily.   No facility-administered encounter medications on file as of 04/23/2018.     Review of Systems  Constitutional: Negative for chills, fever and malaise/fatigue.  HENT: Positive for hearing loss. Negative for congestion.        Poor dentition--getting dental work  Eyes: Negative for blurred vision.       Glasses  Respiratory: Negative for cough and shortness of breath.   Cardiovascular: Negative for chest pain and palpitations.  Gastrointestinal: Negative for abdominal pain, blood in stool, constipation, diarrhea and melena.  Genitourinary: Negative for dysuria and hematuria.  Musculoskeletal: Negative for joint pain and myalgias.  Skin: Negative for itching and rash.  Neurological: Negative for dizziness, loss of consciousness and headaches.  Psychiatric/Behavioral: Negative for depression and memory loss. The patient is not nervous/anxious and does not have insomnia.     Vitals:   04/23/18 1340  BP: 132/70  Pulse: 61  Temp: 98.7 F (37.1 C)  TempSrc: Oral  SpO2: 95%  Weight: 254 lb (115.2 kg)  Height: 5\' 11"  (1.803 m)   Body mass index is 35.43 kg/m. Physical Exam  Constitutional: He is oriented to person, place, and time. He  appears well-developed and well-nourished. No distress.  Overweight male  HENT:  Head: Normocephalic and atraumatic.  Right Ear: External ear normal.  Left Ear: External ear normal.  Nose: Nose normal.  Mouth/Throat: Oropharynx is clear and moist. No oropharyngeal exudate.  Eyes: Pupils are equal, round, and reactive to light. Conjunctivae and EOM are normal. No scleral icterus.  glasses  Neck: Neck supple. No thyromegaly present.  Cardiovascular: Normal  rate, regular rhythm and intact distal pulses.  Murmur heard. Pulmonary/Chest: Effort normal and breath sounds normal. No respiratory distress.  Abdominal: Soft. Bowel sounds are normal. He exhibits no distension. There is no tenderness.  Musculoskeletal: Normal range of motion. He exhibits no tenderness.  Lymphadenopathy:    He has no cervical adenopathy.  Neurological: He is alert and oriented to person, place, and time.  Skin: Skin is warm and dry. Capillary refill takes less than 2 seconds.  Psychiatric: He has a normal mood and affect.    Labs reviewed: Basic Metabolic Panel: Recent Labs    09/24/17 0856 04/16/18 0902  NA 138 138  K 4.2 4.0  CL 101 102  CO2 30 28  GLUCOSE 89 90  BUN 22 22  CREATININE 0.97 1.01  CALCIUM 8.9 8.9   Liver Function Tests: Recent Labs    09/24/17 0856 04/16/18 0902  AST 17 19  ALT 16 16  BILITOT 0.8 0.7  PROT 6.8 6.6   No results for input(s): LIPASE, AMYLASE in the last 8760 hours. No results for input(s): AMMONIA in the last 8760 hours. CBC: Recent Labs    04/16/18 0902  WBC 6.2  NEUTROABS 4,191  HGB 13.3  HCT 39.4  MCV 86.0  PLT 203   Cardiac Enzymes: No results for input(s): CKTOTAL, CKMB, CKMBINDEX, TROPONINI in the last 8760 hours. BNP: Invalid input(s): POCBNP Lab Results  Component Value Date   HGBA1C 5.5 04/16/2018    Lab Results  Component Value Date   VITAMINB12 489 04/01/2008    Assessment/Plan 1. Annual physical exam -performed today, he is up to  date on preventive care  2. Essential hypertension, benign - bp at goal - EKG 12-Lead with first degree av block at rate of 67bpm, no acute ischemia or infarct  3. Hyperglycemia - has been improved lately, cont to work on diet and cont his cycling, volleyball and walking with golf for exercise - Hemoglobin A1c; Future  4. Hyperlipidemia LDL goal <100 -as in #4, cont lipitor - Lipid panel; Future  5. Obesity (BMI 35.0-39.9 without comorbidity) - counseled on dietary choices which is where he falls off, does well with his exercise program - CBC with Differential/Platelet; Future - COMPLETE METABOLIC PANEL WITH GFR; Future - Hemoglobin A1c; Future - Lipid panel; Future  6. Rash in groin:  Appears to be tinea--treat wit otc lotrisone since prescription was too expensive  Labs/tests ordered:   Orders Placed This Encounter  Procedures  . CBC with Differential/Platelet    Standing Status:   Future    Standing Expiration Date:   04/24/2019  . COMPLETE METABOLIC PANEL WITH GFR    Standing Status:   Future    Standing Expiration Date:   04/24/2019  . Hemoglobin A1c    Standing Status:   Future    Standing Expiration Date:   04/24/2019  . Lipid panel    Standing Status:   Future    Standing Expiration Date:   04/24/2019  . EKG 12-Lead    Tyray Proch L. Imanie Darrow, D.O. Geriatrics [facility] [address], [place] [address] Cell Phone (Mon-Fri 8am-5pm):  [phone] On Call:  [phone] & follow prompts after 5pm & weekends Office Phone:  [phone] Office Fax:  [phone]

## 2018-05-20 ENCOUNTER — Other Ambulatory Visit: Payer: Self-pay | Admitting: Internal Medicine

## 2018-05-20 DIAGNOSIS — E785 Hyperlipidemia, unspecified: Secondary | ICD-10-CM

## 2018-06-23 ENCOUNTER — Telehealth: Payer: Self-pay | Admitting: Internal Medicine

## 2018-06-23 NOTE — Telephone Encounter (Signed)
Left msg asking pt to confirm this AWV-S w/ nurse after October labs. Last AWV 03/21/17. VDM (DD)

## 2018-06-25 ENCOUNTER — Other Ambulatory Visit: Payer: Self-pay | Admitting: *Deleted

## 2018-06-25 MED ORDER — LISINOPRIL-HYDROCHLOROTHIAZIDE 20-25 MG PO TABS
1.0000 | ORAL_TABLET | Freq: Every day | ORAL | 1 refills | Status: DC
Start: 2018-06-25 — End: 2019-01-18

## 2018-06-26 ENCOUNTER — Telehealth: Payer: Self-pay | Admitting: *Deleted

## 2018-06-26 ENCOUNTER — Encounter: Payer: Self-pay | Admitting: Internal Medicine

## 2018-06-26 NOTE — Telephone Encounter (Signed)
Sabrina with [facility] called and stated that patient is in the chair for extractions and they needed a clearance due to Heart History. Wanted a verbal from me but I explained that I could not give verbal without the Providers consent and that the patients provider is out of office today. Explained to her the office process of faxing a clearance before the Procedure/surgery so our Provider could make the decision of wether the patient is ok or needs an appointment. Then [place] with [facility] asked me if that was a yes or no. I told her that I could not make that decision and for her to fax over a Clearance. Then she asked me to email her a release for the patient to sign to go ahead with the procedure and I told her that we did not email releases that she would need to send a clearance form with what the patient was having done for our provider to review. Fax number given and she stated that she will fax it.   [facility] for clearance for Surgical Extractions/oral surgery. Dentist:Joel Manchego [phone].  Placed form in Dr. Lesle Chriseed's Folder for review.  Dentist office is aware that Dr. Renato Gailseed is out of the office today.

## 2018-06-26 NOTE — Telephone Encounter (Signed)
Spoke with Duwayne Heckanielle at [facility] and advised results.

## 2018-06-26 NOTE — Telephone Encounter (Signed)
He had an atrial septal defect repair many many years ago.  He's not been on anticoagulation.  I have never seen details of the procedure he had back then because it was so long ago and no such records were available.  He's had no complications from it.  To my knowledge, he's never previously taken antibiotics before dental work and this is no longer on the list of surgeries that requires dental prophylaxis.  He did mention to me that he was going to have extractions, but I was not aware that a clearance was needed and had not gotten any paperwork.

## 2018-07-03 ENCOUNTER — Other Ambulatory Visit: Payer: Self-pay | Admitting: Internal Medicine

## 2018-07-03 DIAGNOSIS — E785 Hyperlipidemia, unspecified: Secondary | ICD-10-CM

## 2018-08-26 ENCOUNTER — Other Ambulatory Visit: Payer: Self-pay | Admitting: Internal Medicine

## 2018-08-26 DIAGNOSIS — E785 Hyperlipidemia, unspecified: Secondary | ICD-10-CM

## 2018-09-03 ENCOUNTER — Telehealth: Payer: Self-pay | Admitting: *Deleted

## 2018-09-03 MED ORDER — ALLOPURINOL 100 MG PO TABS: 100.0000 mg | ORAL_TABLET | Freq: Every day | ORAL | 1 refills | Status: DC

## 2018-09-03 NOTE — Telephone Encounter (Signed)
Patient called requesting a refill on his Allopurinol. Wants a Rx sent to [facility]. States he still takes medication.  Medication was discontinued at his last appointment on 04/16/2018. Is it ok to add medication back and fill. Please Advise.

## 2018-09-03 NOTE — Telephone Encounter (Signed)
Medication list updated and Rx faxed.

## 2018-09-03 NOTE — Telephone Encounter (Signed)
Ok to resume the allopurinol

## 2018-09-18 ENCOUNTER — Other Ambulatory Visit: Payer: Self-pay | Admitting: Internal Medicine

## 2018-09-18 DIAGNOSIS — E785 Hyperlipidemia, unspecified: Secondary | ICD-10-CM

## 2018-10-16 ENCOUNTER — Other Ambulatory Visit: Payer: Self-pay

## 2018-10-16 DIAGNOSIS — R739 Hyperglycemia, unspecified: Secondary | ICD-10-CM

## 2018-10-16 DIAGNOSIS — E785 Hyperlipidemia, unspecified: Secondary | ICD-10-CM

## 2018-10-16 DIAGNOSIS — E669 Obesity, unspecified: Secondary | ICD-10-CM

## 2018-10-19 ENCOUNTER — Other Ambulatory Visit: Payer: Medicare Other

## 2018-10-19 ENCOUNTER — Ambulatory Visit (INDEPENDENT_AMBULATORY_CARE_PROVIDER_SITE_OTHER): Payer: Medicare Other

## 2018-10-19 VITALS — BP 142/70 | HR 73 | Temp 98.0°F | Ht 71.0 in | Wt 254.0 lb

## 2018-10-19 DIAGNOSIS — Z0000 Encounter for general adult medical examination without abnormal findings: Secondary | ICD-10-CM

## 2018-10-19 DIAGNOSIS — R739 Hyperglycemia, unspecified: Secondary | ICD-10-CM | POA: Diagnosis not present

## 2018-10-19 DIAGNOSIS — E785 Hyperlipidemia, unspecified: Secondary | ICD-10-CM | POA: Diagnosis not present

## 2018-10-19 DIAGNOSIS — E669 Obesity, unspecified: Secondary | ICD-10-CM | POA: Diagnosis not present

## 2018-10-19 MED ORDER — ZOSTER VAC RECOMB ADJUVANTED 50 MCG/0.5ML IM SUSR: 0.5000 mL | Freq: Once | INTRAMUSCULAR | 1 refills | Status: AC

## 2018-10-19 NOTE — Patient Instructions (Addendum)
Dean Fry , Thank you for taking time to come for your Medicare Wellness Visit. I appreciate your ongoing commitment to your health goals. Please review the following plan we discussed and let me know if I can assist you in the future.   Screening recommendations/referrals: Colonoscopy up to date, due 03/18/2021 Recommended yearly ophthalmology/optometry visit for glaucoma screening and checkup Recommended yearly dental visit for hygiene and checkup  Vaccinations: Influenza vaccine due, please get on 10/28 Pneumococcal vaccine up to date, completed Tdap vaccine up to date, due 02/26/2022 Shingles vaccine due, ordered to pharmacy    Advanced directives: Please bring [place] a copy of your living will and health care power of attorney  Conditions/risks identified: none  Next appointment: Dr. Renato Gails 10/26/2018 @ 8:30am            Tyron Russell, RN 10/22/2019 @ 8:30am  Preventive Care 65 Years and Older, Male Preventive care refers to lifestyle choices and visits with your health care provider that can promote health and wellness. What does preventive care include?  A yearly physical exam. This is also called an annual well check.  Dental exams once or twice a year.  Routine eye exams. Ask your health care provider how often you should have your eyes checked.  Personal lifestyle choices, including:  Daily care of your teeth and gums.  Regular physical activity.  Eating a healthy diet.  Avoiding tobacco and drug use.  Limiting alcohol use.  Practicing safe sex.  Taking low doses of aspirin every day.  Taking vitamin and mineral supplements as recommended by your health care provider. What happens during an annual well check? The services and screenings done by your health care provider during your annual well check will depend on your age, overall health, lifestyle risk factors, and family history of disease. Counseling  Your health care provider may ask you questions about  your:  Alcohol use.  Tobacco use.  Drug use.  Emotional well-being.  Home and relationship well-being.  Sexual activity.  Eating habits.  History of falls.  Memory and ability to understand (cognition).  Work and work Astronomer. Screening  You may have the following tests or measurements:  Height, weight, and BMI.  Blood pressure.  Lipid and cholesterol levels. These may be checked every 5 years, or more frequently if you are over 57 years old.  Skin check.  Lung cancer screening. You may have this screening every year starting at age 4 if you have a 30-pack-year history of smoking and currently smoke or have quit within the past 15 years.  Fecal occult blood test (FOBT) of the stool. You may have this test every year starting at age 62.  Flexible sigmoidoscopy or colonoscopy. You may have a sigmoidoscopy every 5 years or a colonoscopy every 10 years starting at age 43.  Prostate cancer screening. Recommendations will vary depending on your family history and other risks.  Hepatitis C blood test.  Hepatitis B blood test.  Sexually transmitted disease (STD) testing.  Diabetes screening. This is done by checking your blood sugar (glucose) after you have not eaten for a while (fasting). You may have this done every 1-3 years.  Abdominal aortic aneurysm (AAA) screening. You may need this if you are a current or former smoker.  Osteoporosis. You may be screened starting at age 35 if you are at high risk. Talk with your health care provider about your test results, treatment options, and if necessary, the need for more tests. Vaccines  Your  health care provider may recommend certain vaccines, such as:  Influenza vaccine. This is recommended every year.  Tetanus, diphtheria, and acellular pertussis (Tdap, Td) vaccine. You may need a Td booster every 10 years.  Zoster vaccine. You may need this after age 58.  Pneumococcal 13-valent conjugate (PCV13) vaccine.  One dose is recommended after age 62.  Pneumococcal polysaccharide (PPSV23) vaccine. One dose is recommended after age 65. Talk to your health care provider about which screenings and vaccines you need and how often you need them. This information is not intended to replace advice given to you by your health care provider. Make sure you discuss any questions you have with your health care provider. Document Released: 01/12/2016 Document Revised: 09/04/2016 Document Reviewed: 10/17/2015 Elsevier Interactive Patient Education  2017 [place] Prevention in the Home Falls can cause injuries. They can happen to people of all ages. There are many things you can do to make your home safe and to help prevent falls. What can I do on the outside of my home?  Regularly fix the edges of walkways and driveways and fix any cracks.  Remove anything that might make you trip as you walk through a door, such as a raised step or threshold.  Trim any bushes or trees on the path to your home.  Use bright outdoor lighting.  Clear any walking paths of anything that might make someone trip, such as rocks or tools.  Regularly check to see if handrails are loose or broken. Make sure that both sides of any steps have handrails.  Any raised decks and porches should have guardrails on the edges.  Have any leaves, snow, or ice cleared regularly.  Use sand or salt on walking paths during winter.  Clean up any spills in your garage right away. This includes oil or grease spills. What can I do in the bathroom?  Use night lights.  Install grab bars by the toilet and in the tub and shower. Do not use towel bars as grab bars.  Use non-skid mats or decals in the tub or shower.  If you need to sit down in the shower, use a plastic, non-slip stool.  Keep the floor dry. Clean up any water that spills on the floor as soon as it happens.  Remove soap buildup in the tub or shower regularly.  Attach bath  mats securely with double-sided non-slip rug tape.  Do not have throw rugs and other things on the floor that can make you trip. What can I do in the bedroom?  Use night lights.  Make sure that you have a light by your bed that is easy to reach.  Do not use any sheets or blankets that are too big for your bed. They should not hang down onto the floor.  Have a firm chair that has side arms. You can use this for support while you get dressed.  Do not have throw rugs and other things on the floor that can make you trip. What can I do in the kitchen?  Clean up any spills right away.  Avoid walking on wet floors.  Keep items that you use a lot in easy-to-reach places.  If you need to reach something above you, use a strong step stool that has a grab bar.  Keep electrical cords out of the way.  Do not use floor polish or wax that makes floors slippery. If you must use wax, use non-skid floor wax.  Do not  have throw rugs and other things on the floor that can make you trip. What can I do with my stairs?  Do not leave any items on the stairs.  Make sure that there are handrails on both sides of the stairs and use them. Fix handrails that are broken or loose. Make sure that handrails are as long as the stairways.  Check any carpeting to make sure that it is firmly attached to the stairs. Fix any carpet that is loose or worn.  Avoid having throw rugs at the top or bottom of the stairs. If you do have throw rugs, attach them to the floor with carpet tape.  Make sure that you have a light switch at the top of the stairs and the bottom of the stairs. If you do not have them, ask someone to add them for you. What else can I do to help prevent falls?  Wear shoes that:  Do not have high heels.  Have rubber bottoms.  Are comfortable and fit you well.  Are closed at the toe. Do not wear sandals.  If you use a stepladder:  Make sure that it is fully opened. Do not climb a closed  stepladder.  Make sure that both sides of the stepladder are locked into place.  Ask someone to hold it for you, if possible.  Clearly mark and make sure that you can see:  Any grab bars or handrails.  First and last steps.  Where the edge of each step is.  Use tools that help you move around (mobility aids) if they are needed. These include:  Canes.  Walkers.  Scooters.  Crutches.  Turn on the lights when you go into a dark area. Replace any light bulbs as soon as they burn out.  Set up your furniture so you have a clear path. Avoid moving your furniture around.  If any of your floors are uneven, fix them.  If there are any pets around you, be aware of where they are.  Review your medicines with your doctor. Some medicines can make you feel dizzy. This can increase your chance of falling. Ask your doctor what other things that you can do to help prevent falls. This information is not intended to replace advice given to you by your health care provider. Make sure you discuss any questions you have with your health care provider. Document Released: 10/12/2009 Document Revised: 05/23/2016 Document Reviewed: 01/20/2015 Elsevier Interactive Patient Education  2017 [employer].

## 2018-10-19 NOTE — Progress Notes (Signed)
Subjective:   Dean Fry is a 72 y.o. male who presents for Medicare Annual/Subsequent preventive examination.  Last AWV-03/21/2017    Objective:    Vitals: BP (!) 142/70 (BP Location: Left Arm, Patient Position: Sitting)   Pulse 73   Temp 98 F (36.7 C) (Oral)   Ht 5\' 11"  (1.803 m)   Wt 254 lb (115.2 kg)   SpO2 93%   BMI 35.43 kg/m   Body mass index is 35.43 kg/m.  Advanced Directives 10/19/2018 04/23/2018 11/12/2017 09/29/2017 09/22/2017 03/21/2017 01/07/2017  Does Patient Have a Medical Advance Directive? Yes Yes Yes No No Yes No  Type of Advance Directive Out of facility DNR (pink MOST or yellow form) Healthcare Power of [place];Living will;Out of facility DNR (pink MOST or yellow form) Out of facility DNR (pink MOST or yellow form) - - Living will -  Does patient want to make changes to medical advance directive? No - Patient declined - - - - - -  Copy of Midwife in Chart? - No - copy requested - - - Yes -  Would patient like information on creating a medical advance directive? - - No - Patient declined No - Patient declined Yes (MAU/Ambulatory/Procedural Areas - Information given) - Yes (MAU/Ambulatory/Procedural Areas - Information given)  Pre-existing out of facility DNR order (yellow form or pink MOST form) Yellow form placed in chart (order not valid for inpatient use) Yellow form placed in chart (order not valid for inpatient use) Yellow form placed in chart (order not valid for inpatient use) - - - -    Tobacco Social History   Tobacco Use  Smoking Status Never Smoker  Smokeless Tobacco Never Used     Counseling given: Not Answered   Clinical Intake:  Pre-visit preparation completed: No  Pain : No/denies pain     Diabetes: No  How often do you need to have someone help you when you read instructions, pamphlets, or other written materials from your doctor or pharmacy?: 1 - Never What is the last grade level you completed in  school?: Bachelors  Interpreter Needed?: No  Information entered by :: Tyron Russell, RN  Past Medical History:  Diagnosis Date  . Atrial septal defect    corrected in 1962  . Cataracts, bilateral    "early stages"  . Hypercholesteremia   . Hypertension    Sees Dr. Dorris Fetch  . Impaired hearing   . Impaired vision    glasses  . Low back pain   . Osteoarthritis of both knees    left knee  . Rheumatic fever    at 72 years old  . Shoulder pain    left, sees chiropracter for   . Stroke (HCC) 03/14/2008   pt states that he may have had a mild stroke 1 week after knee surgery   Past Surgical History:  Procedure Laterality Date  . APPENDECTOMY  1979  . ASD REPAIR    . ASD REPAIR    . CATARACT EXTRACTION Left    May 2017  . CATARACT EXTRACTION Right    May 2017  . KNEE SURGERY     4 surgeries on right knee  . plantar fascititis     bilateral surgery   . TONSILLECTOMY  1954   and removal of adnoids  . TOTAL KNEE ARTHROPLASTY  2009   right knee, Dr.Randal   . TOTAL KNEE ARTHROPLASTY  01/15/2013   Procedure: TOTAL KNEE ARTHROPLASTY;  Surgeon: Margarito Liner  Carloyn Manner., MD;  Location: [facility] OR;  Service: Orthopedics;  Laterality: Left;  left total knee arthroplasty   Family History  Adopted: Yes  Problem Relation Age of Onset  . Heart attack Father   . Pancreatic cancer Brother    Social History   Socioeconomic History  . Marital status: Divorced    Spouse name: Not on file  . Number of children: Not on file  . Years of education: Not on file  . Highest education level: Not on file  Occupational History  . Not on file  Social Needs  . Financial resource strain: Not hard at all  . Food insecurity:    Worry: Never true    Inability: Never true  . Transportation needs:    Medical: No    Non-medical: No  Tobacco Use  . Smoking status: Never Smoker  . Smokeless tobacco: Never Used  Substance and Sexual Activity  . Alcohol use: Yes    Alcohol/week: 2.0 standard drinks     Types: 2 Standard drinks or equivalent per week    Comment: occasional use  . Drug use: No  . Sexual activity: Not Currently  Lifestyle  . Physical activity:    Days per week: 7 days    Minutes per session: 30 min  . Stress: Not at all  Relationships  . Social connections:    Talks on phone: Never    Gets together: Twice a week    Attends religious service: Never    Active member of club or organization: No    Attends meetings of clubs or organizations: Never    Relationship status: Divorced  Other Topics Concern  . Not on file  Social History Narrative  . Not on file    Outpatient Encounter Medications as of 10/19/2018  Medication Sig  . aspirin 81 MG tablet Take 81 mg by mouth daily.  Marland Kitchen atorvastatin (LIPITOR) 40 MG tablet TAKE 1 TABLET BY MOUTH AT BEDTIME GENERIC EQUIVALENT FOR LIPITOR  . lisinopril-hydrochlorothiazide (PRINZIDE,ZESTORETIC) 20-25 MG tablet Take 1 tablet by mouth daily.  Marland Kitchen omega-3 acid ethyl esters (LOVAZA) 1 g capsule TAKE 1 CAPSULE BY MOUTH 3 TIMES DAILY  . Zoster Vaccine Adjuvanted [facility]) injection Inject 0.5 mLs into the muscle once for 1 dose.  . [DISCONTINUED] Zoster Vaccine Adjuvanted [facility]) injection Inject 0.5 mLs into the muscle once.  Marland Kitchen allopurinol (ZYLOPRIM) 100 MG tablet Take 1 tablet (100 mg total) by mouth daily.   No facility-administered encounter medications on file as of 10/19/2018.     Activities of Daily Living In your present state of health, do you have any difficulty performing the following activities: 10/19/2018  Hearing? N  Vision? N  Difficulty concentrating or making decisions? N  Walking or climbing stairs? N  Dressing or bathing? N  Doing errands, shopping? N  Preparing Food and eating ? N  Using the Toilet? N  In the past six months, have you accidently leaked urine? Y  Do you have problems with loss of bowel control? N  Managing your Medications? N  Managing your Finances? N  Housekeeping or managing your  Housekeeping? N  Some recent data might be hidden    Patient Care Team: Kermit Balo, DO as PCP - General (Geriatric Medicine)   Assessment:   This is a routine wellness examination for Dean Fry.  Exercise Activities and Dietary recommendations Current Exercise Habits: Home exercise routine, Type of exercise: walking;strength training/weights;Other - see comments(biking), Time (Minutes): 30, Frequency (Times/Week): 7, Weekly  Exercise (Minutes/Week): 210, Intensity: Mild, Exercise limited by: None identified  Goals    . Exercise 5x per week (30 min per time)     Cardiovascular exercise    . Reduce fat intake    . Weight < 200 lb (90.719 kg)     Goal for next visit 250 lbs 06/08/16       Fall Risk Fall Risk  10/19/2018 04/23/2018 11/12/2017 09/29/2017 09/22/2017  Falls in the past year? No No No No No  Number falls in past yr: - - - - -  Injury with Fall? - - - - -   Is the patient's home free of loose throw rugs in walkways, pet beds, electrical cords, etc?   yes      Grab bars in the bathroom? no      Handrails on the stairs?   yes      Adequate lighting?   yes  Depression Screen PHQ 2/9 Scores 10/19/2018 04/23/2018 09/29/2017 03/21/2017  PHQ - 2 Score 0 0 0 0    Cognitive Function completed within last year MMSE - Mini Mental State Exam 04/23/2018 03/21/2017 09/08/2015  Orientation to time 5 5 5   Orientation to Place 5 5 5   Registration 3 3 3   Attention/ Calculation 5 3 3   Recall 3 3 2   Language- name 2 objects 2 2 2   Language- repeat 1 1 1   Language- follow 3 step command 3 3 3   Language- read & follow direction 1 1 1   Write a sentence 1 1 1   Copy design 1 1 1   Total score 30 28 27         Immunization History  Administered Date(s) Administered  . Influenza Whole 08/29/2011, 12/03/2012  . Influenza, High Dose Seasonal PF 09/29/2017  . Influenza,inj,Quad PF,6+ Mos 12/09/2013, 09/02/2014, 09/08/2015, 11/25/2016  . Pneumococcal Conjugate-13 01/06/2015  . Pneumococcal  Polysaccharide-23 02/27/2012  . Tdap 02/27/2012  . Zoster 05/28/2012    Qualifies for Shingles Vaccine? Yes, educated and ordered to pharmacy  Screening Tests Health Maintenance  Topic Date Due  . INFLUENZA VACCINE  07/30/2018  . COLONOSCOPY  03/18/2021  . TETANUS/TDAP  02/26/2022  . Hepatitis C Screening  Completed  . PNA vac Low Risk Adult  Completed   Cancer Screenings: Lung: Low Dose CT Chest recommended if Age 73-80 years, 30 pack-year currently smoking OR have quit w/in 15years. Patient does not qualify. Colorectal: up to date  Additional Screenings:  Hepatitis C Screening: declined Flu vaccine due: requested to get it done next week      Plan:    I have personally reviewed and addressed the Medicare Annual Wellness questionnaire and have noted the following in the patient's chart:  A. Medical and social history B. Use of alcohol, tobacco or illicit drugs  C. Current medications and supplements D. Functional ability and status E.  Nutritional status F.  Physical activity G. Advance directives H. List of other physicians I.  Hospitalizations, surgeries, and ER visits in previous 12 months J.  Vitals K. Screenings to include hearing, vision, cognitive, depression L. Referrals and appointments - none  In addition, I have reviewed and discussed with patient certain preventive protocols, quality metrics, and best practice recommendations. A written personalized care plan for preventive services as well as general preventive health recommendations were provided to patient.  See attached scanned questionnaire for additional information.   Signed,   Tyron Russell, RN Nurse Health Advisor  Patient Concerns: Some blood in stools a couple months  ago but then went away

## 2018-10-20 LAB — HEMOGLOBIN A1C
Hgb A1c MFr Bld: 5.6 % of total Hgb (ref <5.7)
Mean Plasma Glucose: 114 (calc)
eAG (mmol/L): 6.3 (calc)

## 2018-10-20 LAB — CBC WITH DIFFERENTIAL/PLATELET
Basophils Absolute: 47 cells/uL (ref 0–200)
Basophils Relative: 0.6 %
Eosinophils Absolute: 119 cells/uL (ref 15–500)
Eosinophils Relative: 1.5 %
HCT: 41.6 % (ref 38.5–50.0)
Hemoglobin: 14.2 g/dL (ref 13.2–17.1)
Lymphs Abs: 1659 cells/uL (ref 850–3900)
MCH: 29.4 pg (ref 27.0–33.0)
MCHC: 34.1 g/dL (ref 32.0–36.0)
MCV: 86.1 fL (ref 80.0–100.0)
MPV: 11.1 fL (ref 7.5–12.5)
Monocytes Relative: 5.9 %
Neutro Abs: 5609 cells/uL (ref 1500–7800)
Neutrophils Relative %: 71 %
Platelets: 215 10*3/uL (ref 140–400)
RBC: 4.83 10*6/uL (ref 4.20–5.80)
RDW: 13.3 % (ref 11.0–15.0)
Total Lymphocyte: 21 %
WBC mixed population: 466 cells/uL (ref 200–950)
WBC: 7.9 10*3/uL (ref 3.8–10.8)

## 2018-10-20 LAB — LIPID PANEL
Cholesterol: 142 mg/dL (ref <200)
HDL: 35 mg/dL — ABNORMAL LOW (ref >40)
LDL Cholesterol (Calc): 87 mg/dL (calc)
Non-HDL Cholesterol (Calc): 107 mg/dL (calc) (ref <130)
Total CHOL/HDL Ratio: 4.1 (calc) (ref <5.0)
Triglycerides: 104 mg/dL (ref <150)

## 2018-10-20 LAB — COMPLETE METABOLIC PANEL WITH GFR
AG Ratio: 1.4 (calc) (ref 1.0–2.5)
ALT: 16 U/L (ref 9–46)
AST: 20 U/L (ref 10–35)
Albumin: 4.2 g/dL (ref 3.6–5.1)
Alkaline phosphatase (APISO): 68 U/L (ref 40–115)
BUN: 24 mg/dL (ref 7–25)
CO2: 30 mmol/L (ref 20–32)
Calcium: 9.6 mg/dL (ref 8.6–10.3)
Chloride: 102 mmol/L (ref 98–110)
Creat: 1.11 mg/dL (ref 0.70–1.18)
GFR, Est African American: 76 mL/min/{1.73_m2} (ref >=60)
GFR, Est Non African American: 66 mL/min/{1.73_m2} (ref >=60)
Globulin: 2.9 g/dL (calc) (ref 1.9–3.7)
Glucose, Bld: 87 mg/dL (ref 65–99)
Potassium: 4.1 mmol/L (ref 3.5–5.3)
Sodium: 139 mmol/L (ref 135–146)
Total Bilirubin: 0.9 mg/dL (ref 0.2–1.2)
Total Protein: 7.1 g/dL (ref 6.1–8.1)

## 2018-10-26 ENCOUNTER — Ambulatory Visit (INDEPENDENT_AMBULATORY_CARE_PROVIDER_SITE_OTHER): Payer: Medicare Other | Admitting: Internal Medicine

## 2018-10-26 ENCOUNTER — Encounter: Payer: Self-pay | Admitting: Internal Medicine

## 2018-10-26 VITALS — BP 138/80 | HR 61 | Temp 98.0°F | Ht 71.0 in | Wt 252.0 lb

## 2018-10-26 DIAGNOSIS — M545 Low back pain, unspecified: Secondary | ICD-10-CM

## 2018-10-26 DIAGNOSIS — Z23 Encounter for immunization: Secondary | ICD-10-CM

## 2018-10-26 DIAGNOSIS — I10 Essential (primary) hypertension: Secondary | ICD-10-CM

## 2018-10-26 DIAGNOSIS — G8929 Other chronic pain: Secondary | ICD-10-CM

## 2018-10-26 DIAGNOSIS — E7800 Pure hypercholesterolemia, unspecified: Secondary | ICD-10-CM

## 2018-10-26 DIAGNOSIS — Z6835 Body mass index (BMI) 35.0-35.9, adult: Secondary | ICD-10-CM

## 2018-10-26 DIAGNOSIS — E669 Obesity, unspecified: Secondary | ICD-10-CM

## 2018-10-26 DIAGNOSIS — M9905 Segmental and somatic dysfunction of pelvic region: Secondary | ICD-10-CM | POA: Diagnosis not present

## 2018-10-26 DIAGNOSIS — M9903 Segmental and somatic dysfunction of lumbar region: Secondary | ICD-10-CM | POA: Diagnosis not present

## 2018-10-26 DIAGNOSIS — M5136 Other intervertebral disc degeneration, lumbar region: Secondary | ICD-10-CM | POA: Diagnosis not present

## 2018-10-26 DIAGNOSIS — M9904 Segmental and somatic dysfunction of sacral region: Secondary | ICD-10-CM | POA: Diagnosis not present
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
## 2018-10-26 NOTE — Patient Instructions (Signed)
Try to get back on track with your aerobic exercise.  This will get your numbers down (cholesterol and sugar).

## 2019-01-05 ENCOUNTER — Ambulatory Visit (INDEPENDENT_AMBULATORY_CARE_PROVIDER_SITE_OTHER): Payer: Medicare Other | Admitting: Adult Health

## 2019-01-05 ENCOUNTER — Encounter: Payer: Self-pay | Admitting: Adult Health

## 2019-01-05 VITALS — BP 124/78 | HR 68 | Temp 98.0°F | Ht 71.0 in | Wt 260.0 lb

## 2019-01-05 DIAGNOSIS — M9903 Segmental and somatic dysfunction of lumbar region: Secondary | ICD-10-CM | POA: Diagnosis not present

## 2019-01-05 DIAGNOSIS — M9902 Segmental and somatic dysfunction of thoracic region: Secondary | ICD-10-CM | POA: Diagnosis not present

## 2019-01-05 DIAGNOSIS — M5136 Other intervertebral disc degeneration, lumbar region: Secondary | ICD-10-CM | POA: Diagnosis not present

## 2019-01-05 DIAGNOSIS — M5134 Other intervertebral disc degeneration, thoracic region: Secondary | ICD-10-CM | POA: Diagnosis not present

## 2019-01-05 DIAGNOSIS — M159 Polyosteoarthritis, unspecified: Secondary | ICD-10-CM

## 2019-01-05 DIAGNOSIS — M150 Primary generalized (osteo)arthritis: Secondary | ICD-10-CM

## 2019-01-05 DIAGNOSIS — Z8739 Personal history of other diseases of the musculoskeletal system and connective tissue: Secondary | ICD-10-CM | POA: Diagnosis not present

## 2019-01-05 DIAGNOSIS — M8949 Other hypertrophic osteoarthropathy, multiple sites: Secondary | ICD-10-CM
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
## 2019-01-05 NOTE — Patient Instructions (Addendum)
Take Ibuprofen 400 mg every 8 hours as needed for pain. Take with food.  Advised to get uric acid next visit.

## 2019-01-18 ENCOUNTER — Other Ambulatory Visit: Payer: Self-pay | Admitting: Internal Medicine

## 2019-02-10 ENCOUNTER — Other Ambulatory Visit: Payer: Self-pay | Admitting: *Deleted

## 2019-02-10 MED ORDER — LISINOPRIL-HYDROCHLOROTHIAZIDE 20-25 MG PO TABS: 1.0000 | ORAL_TABLET | Freq: Every day | ORAL | 1 refills | Status: DC

## 2019-02-10 NOTE — Telephone Encounter (Signed)
Patient requested refill to be sent to Mail order. Walked in office. Faxed.

## 2019-02-20 ENCOUNTER — Other Ambulatory Visit: Payer: Self-pay | Admitting: Internal Medicine

## 2019-04-23 ENCOUNTER — Other Ambulatory Visit: Payer: Self-pay | Admitting: Internal Medicine

## 2019-04-23 DIAGNOSIS — E785 Hyperlipidemia, unspecified: Secondary | ICD-10-CM

## 2019-04-26 ENCOUNTER — Other Ambulatory Visit: Payer: Medicare Other

## 2019-04-26 ENCOUNTER — Other Ambulatory Visit: Payer: Self-pay

## 2019-04-26 DIAGNOSIS — M9904 Segmental and somatic dysfunction of sacral region: Secondary | ICD-10-CM | POA: Diagnosis not present

## 2019-04-26 DIAGNOSIS — I10 Essential (primary) hypertension: Secondary | ICD-10-CM | POA: Diagnosis not present

## 2019-04-26 DIAGNOSIS — M9905 Segmental and somatic dysfunction of pelvic region: Secondary | ICD-10-CM | POA: Diagnosis not present

## 2019-04-26 DIAGNOSIS — E7800 Pure hypercholesterolemia, unspecified: Secondary | ICD-10-CM

## 2019-04-26 DIAGNOSIS — Z6835 Body mass index (BMI) 35.0-35.9, adult: Secondary | ICD-10-CM

## 2019-04-26 DIAGNOSIS — E669 Obesity, unspecified: Secondary | ICD-10-CM | POA: Diagnosis not present

## 2019-04-26 DIAGNOSIS — M5136 Other intervertebral disc degeneration, lumbar region: Secondary | ICD-10-CM | POA: Diagnosis not present

## 2019-04-26 DIAGNOSIS — M9903 Segmental and somatic dysfunction of lumbar region: Secondary | ICD-10-CM | POA: Diagnosis not present

## 2019-04-27 LAB — LIPID PANEL
Cholesterol: 122 mg/dL (ref <200)
HDL: 34 mg/dL — ABNORMAL LOW (ref >=40)
LDL Cholesterol (Calc): 67 mg/dL (calc)
Non-HDL Cholesterol (Calc): 88 mg/dL (calc) (ref <130)
Total CHOL/HDL Ratio: 3.6 (calc) (ref <5.0)
Triglycerides: 120 mg/dL (ref <150)

## 2019-04-27 LAB — CBC WITH DIFFERENTIAL/PLATELET
Absolute Monocytes: 429 cells/uL (ref 200–950)
Basophils Absolute: 52 cells/uL (ref 0–200)
Basophils Relative: 0.8 %
Eosinophils Absolute: 117 cells/uL (ref 15–500)
Eosinophils Relative: 1.8 %
HCT: 42 % (ref 38.5–50.0)
Hemoglobin: 14.2 g/dL (ref 13.2–17.1)
Lymphs Abs: 2087 cells/uL (ref 850–3900)
MCH: 29.2 pg (ref 27.0–33.0)
MCHC: 33.8 g/dL (ref 32.0–36.0)
MCV: 86.4 fL (ref 80.0–100.0)
MPV: 11.3 fL (ref 7.5–12.5)
Monocytes Relative: 6.6 %
Neutro Abs: 3816 cells/uL (ref 1500–7800)
Neutrophils Relative %: 58.7 %
Platelets: 205 10*3/uL (ref 140–400)
RBC: 4.86 10*6/uL (ref 4.20–5.80)
RDW: 13.4 % (ref 11.0–15.0)
Total Lymphocyte: 32.1 %
WBC: 6.5 10*3/uL (ref 3.8–10.8)

## 2019-04-27 LAB — BASIC METABOLIC PANEL
BUN: 18 mg/dL (ref 7–25)
CO2: 33 mmol/L — ABNORMAL HIGH (ref 20–32)
Calcium: 9.5 mg/dL (ref 8.6–10.3)
Chloride: 100 mmol/L (ref 98–110)
Creat: 0.98 mg/dL (ref 0.70–1.18)
Glucose, Bld: 92 mg/dL (ref 65–99)
Potassium: 4.2 mmol/L (ref 3.5–5.3)
Sodium: 140 mmol/L (ref 135–146)

## 2019-04-27 LAB — HEMOGLOBIN A1C
Hgb A1c MFr Bld: 5.6 % of total Hgb (ref <5.7)
Mean Plasma Glucose: 114 (calc)
eAG (mmol/L): 6.3 (calc)

## 2019-05-03 ENCOUNTER — Encounter: Payer: Self-pay | Admitting: Internal Medicine

## 2019-05-03 ENCOUNTER — Ambulatory Visit (INDEPENDENT_AMBULATORY_CARE_PROVIDER_SITE_OTHER): Payer: Medicare Other | Admitting: Internal Medicine

## 2019-05-03 ENCOUNTER — Other Ambulatory Visit: Payer: Self-pay

## 2019-05-03 DIAGNOSIS — E7800 Pure hypercholesterolemia, unspecified: Secondary | ICD-10-CM

## 2019-05-03 DIAGNOSIS — I10 Essential (primary) hypertension: Secondary | ICD-10-CM

## 2019-05-03 DIAGNOSIS — Z7189 Other specified counseling: Secondary | ICD-10-CM

## 2019-05-03 DIAGNOSIS — Z6835 Body mass index (BMI) 35.0-35.9, adult: Secondary | ICD-10-CM | POA: Diagnosis not present

## 2019-05-03 DIAGNOSIS — M150 Primary generalized (osteo)arthritis: Secondary | ICD-10-CM | POA: Diagnosis not present

## 2019-05-03 DIAGNOSIS — M8949 Other hypertrophic osteoarthropathy, multiple sites: Secondary | ICD-10-CM

## 2019-05-03 DIAGNOSIS — M159 Polyosteoarthritis, unspecified: Secondary | ICD-10-CM

## 2019-05-03 DIAGNOSIS — Z8739 Personal history of other diseases of the musculoskeletal system and connective tissue: Secondary | ICD-10-CM

## 2019-05-03 MED ORDER — LISINOPRIL-HYDROCHLOROTHIAZIDE 20-25 MG PO TABS: 1.0000 | ORAL_TABLET | Freq: Every day | ORAL | 0 refills | Status: DC
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
## 2019-05-14 ENCOUNTER — Other Ambulatory Visit: Payer: Self-pay | Admitting: Internal Medicine

## 2019-05-14 DIAGNOSIS — E785 Hyperlipidemia, unspecified: Secondary | ICD-10-CM

## 2019-06-09 DIAGNOSIS — Z20828 Contact with and (suspected) exposure to other viral communicable diseases: Secondary | ICD-10-CM | POA: Diagnosis not present

## 2019-06-23 ENCOUNTER — Ambulatory Visit (INDEPENDENT_AMBULATORY_CARE_PROVIDER_SITE_OTHER): Payer: Medicare Other | Admitting: Nurse Practitioner

## 2019-06-23 ENCOUNTER — Encounter: Payer: Self-pay | Admitting: Nurse Practitioner

## 2019-06-23 ENCOUNTER — Other Ambulatory Visit: Payer: Self-pay

## 2019-06-23 VITALS — BP 124/62 | HR 80 | Temp 98.4°F | Ht 71.0 in | Wt 257.0 lb

## 2019-06-23 DIAGNOSIS — L821 Other seborrheic keratosis: Secondary | ICD-10-CM

## 2019-06-23 NOTE — Patient Instructions (Signed)
Seborrheic Keratosis  A seborrheic keratosis is a common, noncancerous (benign) skin growth. These growths are velvety, waxy, rough, tan, brown, or black spots that appear on the skin. These skin growths can be flat or raised, and scaly.  What are the causes?  The cause of this condition is not known.  What increases the risk?  You are more likely to develop this condition if you:   Have a family history of seborrheic keratosis.   Are 50 or older.   Are pregnant.   Have had estrogen replacement therapy.  What are the signs or symptoms?  Symptoms of this condition include growths on the face, chest, shoulders, back, or other areas. These growths:   Are usually painless, but may become irritated and itchy.   Can be yellow, brown, black, or other colors.   Are slightly raised or have a flat surface.   Are sometimes rough or wart-like in texture.   Are often velvety or waxy on the surface.   Are round or oval-shaped.   Often occur in groups, but may occur as a single growth.  How is this diagnosed?  This condition is diagnosed with a medical history and physical exam.   A sample of the growth may be tested (skin biopsy).   You may need to see a skin specialist (dermatologist).  How is this treated?  Treatment is not usually needed for this condition, unless the growths are irritated or bleed often.   You may also choose to have the growths removed if you do not like their appearance.  ? Most commonly, these growths are treated with a procedure in which liquid nitrogen is applied to "freeze" off the growth (cryosurgery).  ? They may also be burned off with electricity (electrocautery) or removed by scraping (curettage).  Follow these instructions at home:   Watch your growth for any changes.   Keep all follow-up visits as told by your health care provider. This is important.   Do not scratch or pick at the growth or growths. This can cause them to become irritated or infected.  Contact a health care  provider if:   You suddenly have many new growths.   Your growth bleeds, itches, or hurts.   Your growth suddenly becomes larger or changes color.  Summary   A seborrheic keratosis is a common, noncancerous (benign) skin growth.   Treatment is not usually needed for this condition, unless the growths are irritated or bleed often.   Watch your growth for any changes.   Contact a health care provider if you suddenly have many new growths or your growth suddenly becomes larger or changes color.   Keep all follow-up visits as told by your health care provider. This is important.  This information is not intended to replace advice given to you by your health care provider. Make sure you discuss any questions you have with your health care provider.  Document Released: 01/18/2011 Document Revised: 04/30/2018 Document Reviewed: 04/30/2018  Elsevier Interactive Patient Education  2019 [employer]vier Inc[employer]

## 2019-06-23 NOTE — Progress Notes (Signed)
Careteam: Patient Care Team: [name], Tiffany L, DO as PCP - General (Geriatric Medicine)  Advanced Directive information    No Known Allergies  Chief Complaint  Patient presents with  . Acute Visit    Walk-in, patient with area of conern on right arm (mole)  . Medication Management    FYI- patient takes allopurinol infrequently (not as prescribed)      HPI: Patient is a 73 y.o. male seen in the office today due to red mole.  Grows moles most of his life.  Wears sunscreen but father had skin cancer (he is adopted unsure if biological father had any issues with moles) He wears sunscreen.   Dr Renato Gailseed sent him to dermatologist several years ago who did a skin check and he was cleared and did not request to see him again.   Reports mole was red several days ago but this has resolved.  Pt is very HOH and without hearing aides today Review of Systems:  Review of Systems  Constitutional: Negative for chills, fever, malaise/fatigue and weight loss.  Skin:       Multiple moles    Past Medical History:  Diagnosis Date  . Atrial septal defect    corrected in 1962  . Cataracts, bilateral    "early stages"  . Hypercholesteremia   . Hypertension    Sees Dr. Dorris Fetchiffany Reid  . Impaired hearing   . Impaired vision    glasses  . Low back pain   . Osteoarthritis of both knees    left knee  . Rheumatic fever    at 73 years old  . Shoulder pain    left, sees chiropracter for   . Stroke (HCC) 03/14/2008   pt states that he may have had a mild stroke 1 week after knee surgery   Past Surgical History:  Procedure Laterality Date  . APPENDECTOMY  1979  . ASD REPAIR    . ASD REPAIR    . CATARACT EXTRACTION Left    May 2017  . CATARACT EXTRACTION Right    May 2017  . KNEE SURGERY     4 surgeries on right knee  . plantar fascititis     bilateral surgery   . TONSILLECTOMY  1954   and removal of adnoids  . TOTAL KNEE ARTHROPLASTY  2009   right knee, Dr.Randal   . TOTAL KNEE  ARTHROPLASTY  01/15/2013   Procedure: TOTAL KNEE ARTHROPLASTY;  Surgeon: Thera FlakeW D Caffrey Jr., MD;  Location: [facility] OR;  Service: Orthopedics;  Laterality: Left;  left total knee arthroplasty   Social History:   reports that he has never smoked. He has never used smokeless tobacco. He reports current alcohol use of about 2.0 standard drinks of alcohol per week. He reports that he does not use drugs.  Family History  Adopted: Yes  Problem Relation Age of Onset  . Heart attack Father   . Pancreatic cancer Brother     Medications: Patient's Medications  New Prescriptions   No medications on file  Previous Medications   ALLOPURINOL (ZYLOPRIM) 100 MG TABLET    TAKE 1 TABLET BY MOUTH DAILY GENERIC EQUIVALENT FOR ZYLOPRIM   ASPIRIN 81 MG TABLET    Take 81 mg by mouth daily.   ATORVASTATIN (LIPITOR) 40 MG TABLET    TAKE 1 TABLET BY MOUTH AT BEDTIME GENERIC EQUIVALENT FOR LIPITOR   LISINOPRIL-HYDROCHLOROTHIAZIDE (ZESTORETIC) 20-25 MG TABLET    Take 1 tablet by mouth daily.   OMEGA-3 ACID  ETHYL ESTERS (LOVAZA) 1 G CAPSULE    TAKE 1 CAPSULE BY MOUTH 3 TIMES DAILY  Modified Medications   No medications on file  Discontinued Medications   No medications on file    Physical Exam:  Vitals:   06/23/19 0918  BP: 124/62  Pulse: 80  Temp: 98.4 F (36.9 C)  TempSrc: Oral  SpO2: 96%  Weight: 257 lb (116.6 kg)  Height: 5\' 11"  (1.803 m)   Body mass index is 35.84 kg/m. Wt Readings from Last 3 Encounters:  06/23/19 257 lb (116.6 kg)  01/05/19 260 lb (117.9 kg)  10/26/18 252 lb (114.3 kg)    Physical Exam Constitutional:      Appearance: Normal appearance.  Skin:    General: Skin is warm.     Comments: Few waxy brown area minimally raised to right posterior forearm  Neurological:     General: No focal deficit present.     Mental Status: He is alert and oriented to person, place, and time. Mental status is at baseline.  Psychiatric:        Mood and Affect: Mood normal.     Labs reviewed:  Basic Metabolic Panel: Recent Labs    10/19/18 0858 04/26/19 0858  NA 139 140  K 4.1 4.2  CL 102 100  CO2 30 33*  GLUCOSE 87 92  BUN 24 18  CREATININE 1.11 0.98  CALCIUM 9.6 9.5   Liver Function Tests: Recent Labs    10/19/18 0858  AST 20  ALT 16  BILITOT 0.9  PROT 7.1   No results for input(s): LIPASE, AMYLASE in the last 8760 hours. No results for input(s): AMMONIA in the last 8760 hours. CBC: Recent Labs    10/19/18 0858 04/26/19 0858  WBC 7.9 6.5  NEUTROABS 5,609 3,816  HGB 14.2 14.2  HCT 41.6 42.0  MCV 86.1 86.4  PLT 215 205   Lipid Panel: Recent Labs    10/19/18 0858 04/26/19 0858  CHOL 142 122  HDL 35* 34*  LDLCALC 87 67  TRIG 104 120  CHOLHDL 4.1 3.6   TSH: No results for input(s): TSH in the last 8760 hours. A1C: Lab Results  Component Value Date   HGBA1C 5.6 04/26/2019     Assessment/Plan 1. Seborrheic keratosis Pt with several Seborrheic Keratosis noted on arms, Area not red at this time. Suspect he may have scratched area and it became red.  -to monitor at this time.  Carlos American. Beecher, [facility] [phone]

## 2019-07-11 ENCOUNTER — Other Ambulatory Visit: Payer: Self-pay | Admitting: Internal Medicine

## 2019-07-26 ENCOUNTER — Other Ambulatory Visit: Payer: Self-pay

## 2019-07-26 ENCOUNTER — Encounter: Payer: Self-pay | Admitting: Internal Medicine

## 2019-07-26 ENCOUNTER — Ambulatory Visit (INDEPENDENT_AMBULATORY_CARE_PROVIDER_SITE_OTHER): Payer: Medicare Other | Admitting: Internal Medicine

## 2019-07-26 VITALS — BP 112/68 | HR 63 | Temp 98.5°F | Ht 71.0 in | Wt 255.0 lb

## 2019-07-26 DIAGNOSIS — E7800 Pure hypercholesterolemia, unspecified: Secondary | ICD-10-CM | POA: Diagnosis not present

## 2019-07-26 DIAGNOSIS — Z0000 Encounter for general adult medical examination without abnormal findings: Secondary | ICD-10-CM

## 2019-07-26 DIAGNOSIS — I10 Essential (primary) hypertension: Secondary | ICD-10-CM

## 2019-07-26 DIAGNOSIS — Z6835 Body mass index (BMI) 35.0-35.9, adult: Secondary | ICD-10-CM

## 2019-07-26 DIAGNOSIS — Z8739 Personal history of other diseases of the musculoskeletal system and connective tissue: Secondary | ICD-10-CM

## 2019-07-26 NOTE — Progress Notes (Signed)
Provider:  Gwenith Spitziffany L. Renato Fry, D.O., C.M.D. Location:   [facility]  Place of Service:   clinic  Previous PCP: Dean Fry, Dean Fry Patient Care Team: Dean Fry, Dean Fry as PCP - General (Geriatric Medicine)  Extended Emergency Contact Information Primary Emergency Contact: Dean Fry,Dean Fry  [place] Home Phone: [phone] Relation: Other Secondary Emergency Contact: Dean Fry  [place] Home Phone: [phone] Relation: Other  Code Status: DNR Goals of Care: Advanced Directive information Advanced Directives 10/19/2018  Does Patient Have a Medical Advance Directive? Yes  Type of Advance Directive Out of facility DNR (pink MOST or yellow form)  Does patient want to make changes to medical advance directive? No - Patient declined  Copy of Healthcare Power of Attorney in Chart? -  Would patient like information on creating a medical advance directive? -  Pre-existing out of facility DNR order (yellow form or pink MOST form) Yellow form placed in chart (order not valid for inpatient use)   Chief Complaint  Patient presents with  . Annual Exam    CPE    HPI: Patient is a 73 y.o. male seen today for an annual physical exam.  He saw Dean Fry for a seborrheic keratosis on his right arm.  She recommended monitoring and continued sunscreen.    He's not been golfing as much.  He's been doing a little bit of free weights.  He's gained 5 lbs in the past month.  Says his cardio has been terrible [facility]  He's riding his cruising bike 3-4 times a week early.    With all the sports canceled for fall, he's got to have a lifestyle change.  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    Rash on his left medial thigh is much improved.  Cream was going to be very expensive so he's not treated it.    He needs to get his bottom teeth.  Has the top false teeth.  It's not a big problem but limits crunchy and chewy stuff.  He's got his smoothie  recipes straight.    No gout flares.  His diet is better.  He takes his allopurinol only once a week due to cost and improved diet.    BP excellent this am, but he notes some bladder control issues.  Does urinate frequently.  He gets up once nightly b/w 3:30 and 5am.    If he's playing on his tablet, he'll hold it and it doesn't work so well b/c when he gets up, he might spring a leak on the way to the bathroom.  Hasn't been in public.  He says he has had some of this trouble ever since he was on the oxycodone after his knee surgery and struggled to get off of it.    If he does not exercise, he has a hard time sleeping, but if he is active, he can sleep.  He cannot sleep for more than 5 hrs.    Dean Fry will have no spectators.    Past Medical History:  Diagnosis Date  . Atrial septal defect    corrected in 1962  . Cataracts, bilateral    "early stages"  . Hypercholesteremia   . Hypertension    Sees Dean Fry  . Impaired hearing   . Impaired vision    glasses  . Low back pain   . Osteoarthritis of both knees    left knee  . Rheumatic fever  at 73 years old  . Shoulder pain    left, sees chiropracter for   . Stroke ([facility]) 03/14/2008   pt states that he may have had a mild stroke 1 week after knee surgery   Past Surgical History:  Procedure Laterality Date  . APPENDECTOMY  1979  . ASD REPAIR    . ASD REPAIR    . CATARACT EXTRACTION Left    May 2017  . CATARACT EXTRACTION Right    May 2017  . KNEE SURGERY     4 surgeries on right knee  . plantar fascititis     bilateral surgery   . TONSILLECTOMY  1954   and removal of adnoids  . TOTAL KNEE ARTHROPLASTY  2009   right knee, DeanRandal   . TOTAL KNEE ARTHROPLASTY  01/15/2013   Procedure: TOTAL KNEE ARTHROPLASTY;  Surgeon: Thera FlakeW D Caffrey Jr., MD;  Location: [facility];  Service: Orthopedics;  Laterality: Left;  left total knee arthroplasty    reports that he has never smoked. He has never used smokeless tobacco. He reports  current alcohol use of about 2.0 standard drinks of alcohol per week. He reports that he does not use drugs.  Functional Status Survey:  independent  Family History  Adopted: Yes  Problem Relation Age of Onset  . Heart attack Father   . Pancreatic cancer Brother     Health Maintenance  Topic Date Due  . INFLUENZA VACCINE  07/31/2019  . COLONOSCOPY  03/18/2021  . TETANUS/TDAP  02/26/2022  . Hepatitis C Screening  Completed  . PNA vac Low Risk Adult  Completed    No Known Allergies  Outpatient Encounter Medications as of 07/26/2019  Medication Sig  . allopurinol (ZYLOPRIM) 100 MG tablet TAKE 1 TABLET BY MOUTH DAILY GENERIC EQUIVALENT FOR ZYLOPRIM  . aspirin 81 MG tablet Take 81 mg by mouth daily.  Marland Kitchen. atorvastatin (LIPITOR) 40 MG tablet TAKE 1 TABLET BY MOUTH AT BEDTIME GENERIC EQUIVALENT FOR LIPITOR  . lisinopril-hydrochlorothiazide (ZESTORETIC) 20-25 MG tablet TAKE 1 TABLET BY MOUTH DAILY  . omega-3 acid ethyl esters (LOVAZA) 1 g capsule TAKE 1 CAPSULE BY MOUTH 3 TIMES DAILY   No facility-administered encounter medications on file as of 07/26/2019.     Review of Systems  Constitutional: Negative for chills, fever, malaise/fatigue and weight loss.  HENT: Positive for hearing loss. Negative for congestion.        Bilateral hearing aids  Eyes: Negative for blurred vision.       Admits left eye vision declining and needs f/u (has been less than a year since last eye exam)  Respiratory: Negative for cough and shortness of breath.   Cardiovascular: Negative for chest pain, palpitations and leg swelling.  Gastrointestinal: Negative for abdominal pain, blood in stool, constipation, diarrhea and melena.  Genitourinary: Positive for frequency and urgency. Negative for dysuria, flank pain and hematuria.  Musculoskeletal: Negative for falls, joint pain and myalgias.  Skin: Positive for rash.       Fungal rash left groin/medial thigh; athlete's foot/fungal toenails  Neurological:  Negative for dizziness, tingling, sensory change and weakness.  Endo/Heme/Allergies: Does not bruise/bleed easily.  Psychiatric/Behavioral: Negative for depression and memory loss. The patient is not nervous/anxious and does not have insomnia.        Only sleeps 5 hrs and only sleeps well if exercises    Vitals:   07/26/19 0840  BP: 112/68  Pulse: 63  Temp: 98.5 F (36.9 C)  TempSrc: Oral  SpO2: 96%  Weight: 255 lb (115.7 kg)  Height: 5\' 11"  (1.803 m)   Body mass index is 35.57 kg/m. Physical Exam Vitals signs reviewed.  Constitutional:      General: He is not in acute distress.    Appearance: Normal appearance. He is obese. He is not ill-appearing or toxic-appearing.  HENT:     Head: Normocephalic and atraumatic.     Right Ear: Tympanic membrane, ear canal and external ear normal. There is no impacted cerumen.     Left Ear: Tympanic membrane, ear canal and external ear normal. There is no impacted cerumen.     Ears:     Comments: Bilateral hearing aids    Nose: No congestion.  Eyes:     Extraocular Movements: Extraocular movements intact.     Conjunctiva/sclera: Conjunctivae normal.     Pupils: Pupils are equal, round, and reactive to light.  Neck:     Musculoskeletal: Neck supple. No muscular tenderness.  Cardiovascular:     Rate and Rhythm: Normal rate and regular rhythm.     Pulses: Normal pulses.     Heart sounds: Murmur present.  Pulmonary:     Effort: Pulmonary effort is normal.     Breath sounds: Normal breath sounds.  Abdominal:     General: Bowel sounds are normal. There is no distension.     Palpations: Abdomen is soft. There is no mass.     Tenderness: There is no abdominal tenderness.  Musculoskeletal: Normal range of motion.     Right lower leg: No edema.     Left lower leg: No edema.  Lymphadenopathy:     Cervical: No cervical adenopathy.  Skin:    General: Skin is warm and dry.     Capillary Refill: Capillary refill takes less than 2 seconds.      Comments: Small seborrheic keratoses right forearm; left medial thigh/groin with patchy macular erythematous rash that is faint at present  Neurological:     General: No focal deficit present.     Mental Status: He is alert and oriented to person, place, and time. Mental status is at baseline.     Cranial Nerves: No cranial nerve deficit.     Sensory: No sensory deficit.     Motor: No weakness.     Coordination: Coordination normal.     Gait: Gait normal.     Comments: Patellar reflexes cannot be obtained due to bilateral TKAs  Psychiatric:        Mood and Affect: Mood normal.        Behavior: Behavior normal.        Thought Content: Thought content normal.        Judgment: Judgment normal.     Labs reviewed: Basic Metabolic Panel: Recent Labs    10/19/18 0858 04/26/19 0858  NA 139 140  K 4.1 4.2  CL 102 100  CO2 30 33*  GLUCOSE 87 92  BUN 24 18  CREATININE 1.11 0.98  CALCIUM 9.6 9.5   Liver Function Tests: Recent Labs    10/19/18 0858  AST 20  ALT 16  BILITOT 0.9  PROT 7.1   No results for input(s): LIPASE, AMYLASE in the last 8760 hours. No results for input(s): AMMONIA in the last 8760 hours. CBC: Recent Labs    10/19/18 0858 04/26/19 0858  WBC 7.9 6.5  NEUTROABS 5,609 3,816  HGB 14.2 14.2  HCT 41.6 42.0  MCV 86.1 86.4  PLT 215 205   Cardiac Enzymes: No results for  input(s): CKTOTAL, CKMB, CKMBINDEX, TROPONINI in the last 8760 hours. BNP: Invalid input(s): POCBNP Lab Results  Component Value Date   HGBA1C 5.6 04/26/2019    Lab Results  Component Value Date   VITAMINB12 489 04/01/2008   Assessment/Plan 1. Annual physical exam -performed today -up to date on preventive care  2. Essential hypertension, benign -bp at goal--we did discuss that we could try to take him off the hctz due to increased urinary frequency and urgency as of late - EKG 12-Lead was unchanged from last year's cpe  3. BMI 35.0-35.9,adult -unchanged--weight up a few  lbs -needs to work on coming off coca cola  4. Pure hypercholesterolemia -lipids have been improving with his cardio/cycling -encouraged this and his weights -cont lipitor as well  5. History of gout -continue allopurinol--he's only taking weekly now but has improved his diet -cost was prohibitive  Labs/tests ordered:  Already has labs ordered for November and I will see him afterward; keep awv with Dean Fry  11/15/2019  Dean Fry L. Tonnya Garbett, D.O. Geriatrics [facility] [address] [place], [place] [address] Cell Phone (Mon-Fri 8am-5pm):  [phone] On Call:  [phone] & follow prompts after 5pm & weekends Office Phone:  [phone] Office Fax:  [phone]

## 2019-08-10 DIAGNOSIS — M5136 Other intervertebral disc degeneration, lumbar region: Secondary | ICD-10-CM | POA: Diagnosis not present

## 2019-08-10 DIAGNOSIS — M9904 Segmental and somatic dysfunction of sacral region: Secondary | ICD-10-CM | POA: Diagnosis not present

## 2019-08-10 DIAGNOSIS — M9905 Segmental and somatic dysfunction of pelvic region: Secondary | ICD-10-CM | POA: Diagnosis not present

## 2019-08-10 DIAGNOSIS — M9903 Segmental and somatic dysfunction of lumbar region: Secondary | ICD-10-CM | POA: Diagnosis not present

## 2019-08-17 ENCOUNTER — Other Ambulatory Visit: Payer: Self-pay | Admitting: Internal Medicine

## 2019-08-17 DIAGNOSIS — E785 Hyperlipidemia, unspecified: Secondary | ICD-10-CM

## 2019-09-24 ENCOUNTER — Other Ambulatory Visit: Payer: Self-pay

## 2019-09-24 ENCOUNTER — Encounter: Payer: Self-pay | Admitting: Nurse Practitioner

## 2019-09-24 ENCOUNTER — Ambulatory Visit (INDEPENDENT_AMBULATORY_CARE_PROVIDER_SITE_OTHER): Payer: Medicare Other | Admitting: Nurse Practitioner

## 2019-09-24 VITALS — BP 110/62 | HR 68 | Temp 97.5°F | Ht 71.0 in | Wt 245.0 lb

## 2019-09-24 DIAGNOSIS — R5381 Other malaise: Secondary | ICD-10-CM

## 2019-09-24 DIAGNOSIS — R251 Tremor, unspecified: Secondary | ICD-10-CM | POA: Diagnosis not present

## 2019-09-24 DIAGNOSIS — Z23 Encounter for immunization: Secondary | ICD-10-CM

## 2019-09-24 NOTE — Progress Notes (Signed)
Careteam: Patient Care Team: Gayland Curry, DO as PCP - General (Geriatric Medicine)  Advanced Directive information    No Known Allergies  Chief Complaint  Patient presents with  . Acute Visit    Discuss referral to specialist   . Immunizations    Flu vaccine today      HPI: Patient is a 73 y.o. male seen in the office today due to feeling like something is going on. Question parkinson and would like to talk about it. States in the last 6 months his golf game has really gotten bad. Could not keep up walking the course. Yesterday he slept for 6 hours after golf.  Reports he is very sore/stiff after golf.  In the evening he has tremor on his left hand.  States the left hand will tremor when he is filling up water bottle.  No problem with eating. Can feed him self and get dressed without issues. Loss of taste which has been persist for several months. [facility] ([facility] ago and then golf yesterday) but has regimen at home. strength has been good.   Reports last winter he got "soft" and lost a lot of strength he decided to pick it up in the Spring. States he has done better and strength is improving.   Plays golf and "shoot between 74-96" too big of a gap.  In the last 6 months game has gotten worse. Pt reports there is a "Lack of connection in the synapsis" reports strange dreams with his parents who have past.   Natural grandparents had cancer but he was adopted.    Review of Systems:  Review of Systems  Constitutional: Positive for weight loss (attempting weight loss). Negative for chills, fever and malaise/fatigue.  HENT: Positive for hearing loss. Negative for congestion.        Bilateral hearing aids  Eyes: Negative for blurred vision.  Respiratory: Negative for cough and shortness of breath.   Cardiovascular: Negative for chest pain, palpitations and leg swelling.  Gastrointestinal: Negative for abdominal pain, constipation and diarrhea.   Genitourinary: Negative for dysuria, frequency and urgency.  Musculoskeletal: Negative for falls, joint pain and myalgias.  Skin: Negative for rash.  Neurological: Positive for tremors (to left hand in evening). Negative for dizziness, tingling, sensory change and weakness.  Endo/Heme/Allergies: Does not bruise/bleed easily.  Psychiatric/Behavioral: Negative for depression and memory loss. The patient is not nervous/anxious and does not have insomnia.     Past Medical History:  Diagnosis Date  . Atrial septal defect    corrected in 1962  . Cataracts, bilateral    "early stages"  . Hypercholesteremia   . Hypertension    Sees Dr. Jose Persia  . Impaired hearing   . Impaired vision    glasses  . Low back pain   . Osteoarthritis of both knees    left knee  . Rheumatic fever    at 73 years old  . Shoulder pain    left, sees chiropracter for   . Stroke ([facility]) 03/14/2008   pt states that he may have had a mild stroke 1 week after knee surgery   Past Surgical History:  Procedure Laterality Date  . APPENDECTOMY  1979  . ASD REPAIR    . ASD REPAIR    . CATARACT EXTRACTION Left    May 2017  . CATARACT EXTRACTION Right    May 2017  . KNEE SURGERY     4  surgeries on right knee  . plantar fascititis     bilateral surgery   . TONSILLECTOMY  1954   and removal of adnoids  . TOTAL KNEE ARTHROPLASTY  2009   right knee, Dr.Randal   . TOTAL KNEE ARTHROPLASTY  01/15/2013   Procedure: TOTAL KNEE ARTHROPLASTY;  Surgeon: Thera Flake., MD;  Location: [facility];  Service: Orthopedics;  Laterality: Left;  left total knee arthroplasty   Social History:   reports that he has never smoked. He has never used smokeless tobacco. He reports current alcohol use of about 2.0 standard drinks of alcohol per week. He reports that he does not use drugs.  Family History  Adopted: Yes  Problem Relation Age of Onset  . Heart attack Father   . Pancreatic cancer Brother     Medications: Patient's  Medications  New Prescriptions   No medications on file  Previous Medications   ALLOPURINOL (ZYLOPRIM) 100 MG TABLET    TAKE 1 TABLET BY MOUTH DAILY GENERIC EQUIVALENT FOR ZYLOPRIM   ASPIRIN 81 MG TABLET    Take 81 mg by mouth daily.   ATORVASTATIN (LIPITOR) 40 MG TABLET    TAKE 1 TABLET BY MOUTH AT BEDTIME. GENERIC EQUIVALENT FOR LIPITOR   LISINOPRIL-HYDROCHLOROTHIAZIDE (ZESTORETIC) 20-25 MG TABLET    TAKE 1 TABLET BY MOUTH DAILY   OMEGA-3 ACID ETHYL ESTERS (LOVAZA) 1 G CAPSULE    TAKE 1 CAPSULE BY MOUTH 3 TIMES DAILY  Modified Medications   No medications on file  Discontinued Medications   No medications on file    Physical Exam:  Vitals:   09/24/19 0932  BP: 110/62  Pulse: 68  Temp: (!) 97.5 F (36.4 C)  TempSrc: Oral  SpO2: 96%  Weight: 245 lb (111.1 kg)  Height: 5\' 11"  (1.803 m)   Body mass index is 34.17 kg/m. Wt Readings from Last 3 Encounters:  09/24/19 245 lb (111.1 kg)  07/26/19 255 lb (115.7 kg)  06/23/19 257 lb (116.6 kg)    Physical Exam Vitals signs reviewed.  Constitutional:      General: He is not in acute distress.    Appearance: Normal appearance. He is obese. He is not ill-appearing or toxic-appearing.  HENT:     Head: Normocephalic and atraumatic.     Right Ear: Tympanic membrane, ear canal and external ear normal. There is no impacted cerumen.     Left Ear: Tympanic membrane, ear canal and external ear normal. There is no impacted cerumen.     Ears:     Comments: Bilateral hearing aids    Nose: No congestion.  Eyes:     Extraocular Movements: Extraocular movements intact.     Conjunctiva/sclera: Conjunctivae normal.     Pupils: Pupils are equal, round, and reactive to light.  Neck:     Musculoskeletal: Neck supple. No muscular tenderness.  Cardiovascular:     Rate and Rhythm: Normal rate and regular rhythm.     Pulses: Normal pulses.     Heart sounds: Murmur present.  Pulmonary:     Effort: Pulmonary effort is normal.     Breath  sounds: Normal breath sounds.  Abdominal:     General: Bowel sounds are normal. There is no distension.     Palpations: Abdomen is soft. There is no mass.     Tenderness: There is no abdominal tenderness.  Musculoskeletal: Normal range of motion.     Right lower leg: No edema.     Left lower leg: No edema.  Lymphadenopathy:     Cervical: No cervical adenopathy.  Skin:    General: Skin is warm and dry.     Capillary Refill: Capillary refill takes less than 2 seconds.     Comments: Small seborrheic keratoses right forearm; left medial thigh/groin with patchy macular erythematous rash that is faint at present  Neurological:     General: No focal deficit present.     Mental Status: He is alert and oriented to person, place, and time. Mental status is at baseline.     Cranial Nerves: No cranial nerve deficit.     Sensory: No sensory deficit.     Motor: No weakness, tremor, atrophy or abnormal muscle tone.     Coordination: Coordination is intact. Romberg sign negative. Coordination normal. Finger-Nose-Finger Test and Heel to [facility] Test normal.     Gait: Gait is intact. Gait normal.     Comments: Patellar reflexes cannot be obtained due to bilateral TKAs  Psychiatric:        Mood and Affect: Mood normal.        Behavior: Behavior normal.        Thought Content: Thought content normal.        Judgment: Judgment normal.     Labs reviewed: Basic Metabolic Panel: Recent Labs    10/19/18 0858 04/26/19 0858  NA 139 140  K 4.1 4.2  CL 102 100  CO2 30 33*  GLUCOSE 87 92  BUN 24 18  CREATININE 1.11 0.98  CALCIUM 9.6 9.5   Liver Function Tests: Recent Labs    10/19/18 0858  AST 20  ALT 16  BILITOT 0.9  PROT 7.1   No results for input(s): LIPASE, AMYLASE in the last 8760 hours. No results for input(s): AMMONIA in the last 8760 hours. CBC: Recent Labs    10/19/18 0858 04/26/19 0858  WBC 7.9 6.5  NEUTROABS 5,609 3,816  HGB 14.2 14.2  HCT 41.6 42.0  MCV 86.1 86.4  PLT  215 205   Lipid Panel: Recent Labs    10/19/18 0858 04/26/19 0858  CHOL 142 122  HDL 35* 34*  LDLCALC 87 67  TRIG 104 120  CHOLHDL 4.1 3.6   TSH: No results for input(s): TSH in the last 8760 hours. A1C: Lab Results  Component Value Date   HGBA1C 5.6 04/26/2019     Assessment/Plan 1. Need for influenza vaccination - Flu Vaccine QUAD High Dose(Fluad)  2. Tremor Left arm tremor reported in the evening but does not effect ADLs. No tremor today on exam. To continue to monitor and notify if worsening.  3. Debility Very concerned over the range of his golf game. States that he does not feel weak and has worked on getting strength up. He was worried about neurologic condition but exam today reassuring. He will continue to work on strength and keep routine follow up with Dr Renato Gailseed.   Next appt: 10/25/2019 for Awv, sooner if needed Shayleen Eppinger K. Biagio BorgEubanks, AGNP  [facility] [phone]

## 2019-09-24 NOTE — Patient Instructions (Addendum)
Continue to work on strength to upper body  Keep follow up with Dr Mariea Clonts

## 2019-10-22 ENCOUNTER — Ambulatory Visit: Payer: Self-pay

## 2019-10-22 ENCOUNTER — Encounter: Payer: Self-pay | Admitting: Family

## 2019-10-25 ENCOUNTER — Ambulatory Visit (INDEPENDENT_AMBULATORY_CARE_PROVIDER_SITE_OTHER): Payer: Medicare Other | Admitting: Nurse Practitioner

## 2019-10-25 ENCOUNTER — Other Ambulatory Visit: Payer: Self-pay

## 2019-10-25 ENCOUNTER — Encounter: Payer: Self-pay | Admitting: Nurse Practitioner

## 2019-10-25 VITALS — BP 122/80 | HR 54 | Temp 97.7°F | Ht 71.0 in | Wt 255.0 lb

## 2019-10-25 DIAGNOSIS — M9905 Segmental and somatic dysfunction of pelvic region: Secondary | ICD-10-CM | POA: Diagnosis not present

## 2019-10-25 DIAGNOSIS — Z0000 Encounter for general adult medical examination without abnormal findings: Secondary | ICD-10-CM

## 2019-10-25 DIAGNOSIS — M9903 Segmental and somatic dysfunction of lumbar region: Secondary | ICD-10-CM | POA: Diagnosis not present

## 2019-10-25 DIAGNOSIS — M5136 Other intervertebral disc degeneration, lumbar region: Secondary | ICD-10-CM | POA: Diagnosis not present

## 2019-10-25 DIAGNOSIS — M9904 Segmental and somatic dysfunction of sacral region: Secondary | ICD-10-CM | POA: Diagnosis not present

## 2019-10-25 NOTE — Progress Notes (Signed)
Subjective:   Dean Fry is a 73 y.o. male who presents for Medicare Annual/Subsequent preventive examination.  Review of Systems:   Cardiac Risk Factors include: advanced age (>4755men, 87>65 women);hypertension;male gender;obesity (BMI >30kg/m2);dyslipidemia     Objective:    Vitals: BP 122/80 (BP Location: Left Arm, Patient Position: Sitting, Cuff Size: Large)   Pulse (!) 54   Temp 97.7 F (36.5 C) (Temporal)   Ht 5\' 11"  (1.803 m)   Wt 255 lb (115.7 kg)   SpO2 96%   BMI 35.57 kg/m   Body mass index is 35.57 kg/m.  Advanced Directives 10/25/2019 10/19/2018 04/23/2018 11/12/2017 09/29/2017 09/22/2017 03/21/2017  Does Patient Have a Medical Advance Directive? Yes Yes Yes Yes No No Yes  Type of Advance Directive Out of facility DNR (pink MOST or yellow form) Out of facility DNR (pink MOST or yellow form) Healthcare Power of NettieAttorney;Living will;Out of facility DNR (pink MOST or yellow form) Out of facility DNR (pink MOST or yellow form) - - Living will  Does patient want to make changes to medical advance directive? No - Patient declined No - Patient declined - - - - -  Copy of MidwifeHealthcare Power of Attorney in Chart? - - No - copy requested - - - Yes  Would patient like information on creating a medical advance directive? - - - No - Patient declined No - Patient declined Yes (MAU/Ambulatory/Procedural Areas - Information given) -  Pre-existing out of facility DNR order (yellow form or pink MOST form) - Yellow form placed in chart (order not valid for inpatient use) Yellow form placed in chart (order not valid for inpatient use) Yellow form placed in chart (order not valid for inpatient use) - - -    Tobacco Social History   Tobacco Use  Smoking Status Never Smoker  Smokeless Tobacco Never Used     Counseling given: Not Answered   Clinical Intake:  Pre-visit preparation completed: Yes  Pain : No/denies pain     BMI - recorded: 35.57 Nutritional Status: BMI > 30   Obese Nutritional Risks: Unintentional weight gain Diabetes: No  How often do you need to have someone help you when you read instructions, pamphlets, or other written materials from your doctor or pharmacy?: 1 - Never What is the last grade level you completed in school?: college graduate  Interpreter Needed?: No     Past Medical History:  Diagnosis Date  . Atrial septal defect    corrected in 1962  . Cataracts, bilateral    "early stages"  . Hypercholesteremia   . Hypertension    Sees Dr. Dorris Fetchiffany Reid  . Impaired hearing   . Impaired vision    glasses  . Low back pain   . Osteoarthritis of both knees    left knee  . Rheumatic fever    at 73 years old  . Shoulder pain    left, sees chiropracter for   . Stroke (HCC) 03/14/2008   pt states that he may have had a mild stroke 1 week after knee surgery   Past Surgical History:  Procedure Laterality Date  . APPENDECTOMY  1979  . ASD REPAIR    . ASD REPAIR    . CATARACT EXTRACTION Left    May 2017  . CATARACT EXTRACTION Right    May 2017  . KNEE SURGERY     4 surgeries on right knee  . plantar fascititis     bilateral surgery   . TONSILLECTOMY  1954   and removal of adnoids  . TOTAL KNEE ARTHROPLASTY  2009   right knee, Dr.Randal   . TOTAL KNEE ARTHROPLASTY  01/15/2013   Procedure: TOTAL KNEE ARTHROPLASTY;  Surgeon: Thera Flake., MD;  Location: [facility] OR;  Service: Orthopedics;  Laterality: Left;  left total knee arthroplasty   Family History  Adopted: Yes  Problem Relation Age of Onset  . Heart attack Father   . Pancreatic cancer Brother    Social History   Socioeconomic History  . Marital status: Divorced    Spouse name: Not on file  . Number of children: Not on file  . Years of education: Not on file  . Highest education level: Not on file  Occupational History  . Not on file  Social Needs  . Financial resource strain: Not hard at all  . Food insecurity    Worry: Never true    Inability: Never true   . Transportation needs    Medical: No    Non-medical: No  Tobacco Use  . Smoking status: Never Smoker  . Smokeless tobacco: Never Used  Substance and Sexual Activity  . Alcohol use: Yes    Alcohol/week: 2.0 standard drinks    Types: 2 Standard drinks or equivalent per week    Comment: occasional use  . Drug use: No  . Sexual activity: Not Currently  Lifestyle  . Physical activity    Days per week: 7 days    Minutes per session: 30 min  . Stress: Not at all  Relationships  . Social Musician on phone: Never    Gets together: Twice a week    Attends religious service: Never    Active member of club or organization: No    Attends meetings of clubs or organizations: Never    Relationship status: Divorced  Other Topics Concern  . Not on file  Social History Narrative  . Not on file    Outpatient Encounter Medications as of 10/25/2019  Medication Sig  . allopurinol (ZYLOPRIM) 100 MG tablet Take 100 mg by mouth as needed.  Marland Kitchen aspirin 81 MG tablet Take 81 mg by mouth daily.  Marland Kitchen atorvastatin (LIPITOR) 40 MG tablet TAKE 1 TABLET BY MOUTH AT BEDTIME. GENERIC EQUIVALENT FOR LIPITOR  . lisinopril-hydrochlorothiazide (ZESTORETIC) 20-25 MG tablet TAKE 1 TABLET BY MOUTH DAILY  . omega-3 acid ethyl esters (LOVAZA) 1 g capsule TAKE 1 CAPSULE BY MOUTH 3 TIMES DAILY  . [DISCONTINUED] allopurinol (ZYLOPRIM) 100 MG tablet TAKE 1 TABLET BY MOUTH DAILY GENERIC EQUIVALENT FOR ZYLOPRIM   No facility-administered encounter medications on file as of 10/25/2019.     Activities of Daily Living In your present state of health, do you have any difficulty performing the following activities: 10/25/2019  Hearing? Y  Vision? Y  Comment scheduled eye appt  Difficulty concentrating or making decisions? N  Walking or climbing stairs? Y  Comment 2 knee replacements  Dressing or bathing? N  Doing errands, shopping? N  Preparing Food and eating ? N  Using the Toilet? N  In the past six  months, have you accidently leaked urine? Y  Do you have problems with loss of bowel control? N  Managing your Medications? N  Managing your Finances? N  Housekeeping or managing your Housekeeping? N  Some recent data might be hidden    Patient Care Team: Kermit Balo, DO as PCP - General (Geriatric Medicine)   Assessment:   This is a routine  wellness examination for Dean Fry.  Exercise Activities and Dietary recommendations Current Exercise Habits: Home exercise routine, Type of exercise: strength training/weights;calisthenics;stretching;Other - see comments(bike), Time (Minutes): 15, Frequency (Times/Week): 7, Weekly Exercise (Minutes/Week): 105, Exercise limited by: orthopedic condition(s)  Goals    . Exercise 5x per week (30 min per time)     Cardiovascular exercise    . Reduce fat intake    . Weight < 200 lb (90.719 kg)     Goal for next visit 250 lbs 06/08/16       Fall Risk Fall Risk  10/25/2019 07/26/2019 05/03/2019 01/05/2019 10/19/2018  Falls in the past year? 0 0 0 0 No  Number falls in past yr: 0 0 0 0 -  Injury with Fall? 0 0 0 0 -   Is the patient's home free of loose throw rugs in walkways, pet beds, electrical cords, etc?   yes      Grab bars in the bathroom? yes      Handrails on the stairs?   yes      Adequate lighting?   yes  Timed Get Up and Go Performed: na  Depression Screen PHQ 2/9 Scores 10/25/2019 07/26/2019 10/19/2018 04/23/2018  PHQ - 2 Score 0 0 0 0    Cognitive Function MMSE - Mini Mental State Exam 10/25/2019 04/23/2018 03/21/2017 09/08/2015  Orientation to time 5 5 5 5   Orientation to Place 5 5 5 5   Registration 3 3 3 3   Attention/ Calculation 5 5 3 3   Recall 3 3 3 2   Language- name 2 objects 2 2 2 2   Language- repeat 1 1 1 1   Language- follow 3 step command 3 3 3 3   Language- read & follow direction 1 1 1 1   Write a sentence 1 1 1 1   Copy design 1 1 1 1   Total score 30 30 28 27         Immunization History  Administered Date(s)  Administered  . Fluad Quad(high Dose 65+) 09/24/2019  . Influenza Whole 08/29/2011, 12/03/2012  . Influenza, High Dose Seasonal PF 09/29/2017, 10/26/2018  . Influenza,inj,Quad PF,6+ Mos 12/09/2013, 09/02/2014, 09/08/2015, 11/25/2016  . Pneumococcal Conjugate-13 01/06/2015  . Pneumococcal Polysaccharide-23 02/27/2012  . Tdap 02/27/2012  . Zoster 05/28/2012    Qualifies for Shingles Vaccine? yes  Screening Tests Health Maintenance  Topic Date Due  . COLONOSCOPY  03/18/2021  . TETANUS/TDAP  02/26/2022  . INFLUENZA VACCINE  Completed  . Hepatitis C Screening  Completed  . PNA vac Low Risk Adult  Completed   Cancer Screenings: Lung: Low Dose CT Chest recommended if Age 32-80 years, 30 pack-year currently smoking OR have quit w/in 15years. Patient does not qualify. Colorectal: up to date, due 2022  Additional Screenings:  Hepatitis C Screening: done      Plan:     I have personally reviewed and noted the following in the patient's chart:   . Medical and social history . Use of alcohol, tobacco or illicit drugs  . Current medications and supplements . Functional ability and status . Nutritional status . Physical activity . Advanced directives . List of other physicians . Hospitalizations, surgeries, and ER visits in previous 12 months . Vitals . Screenings to include cognitive, depression, and falls . Referrals and appointments  In addition, I have reviewed and discussed with patient certain preventive protocols, quality metrics, and best practice recommendations. A written personalized care plan for preventive services as well as general preventive health recommendations were provided to patient.  Sharon Seller, NP  10/25/2019

## 2019-10-25 NOTE — Patient Instructions (Signed)
Dean Fry , Thank you for taking time to come for your Medicare Wellness Visit. I appreciate your ongoing commitment to your health goals. Please review the following plan we discussed and let me know if I can assist you in the future.   Screening recommendations/referrals: Colonoscopy will be due 2022 Recommended yearly ophthalmology/optometry visit for glaucoma screening and checkup Recommended yearly dental visit for hygiene and checkup  Vaccinations: Influenza vaccine  Up to date Pneumococcal vaccine up to date Tdap vaccine up to date Shingles vaccine TO GET shringrix at local pharamcy    Advanced directives: on file  Conditions/risks identified: obesity, advised to continue with exercise and making dietary modifications.  Next appointment: 1 year  Preventive Care 74 Years and Older, Male Preventive care refers to lifestyle choices and visits with your health care provider that can promote health and wellness. What does preventive care include?  A yearly physical exam. This is also called an annual well check.  Dental exams once or twice a year.  Routine eye exams. Ask your health care provider how often you should have your eyes checked.  Personal lifestyle choices, including:  Daily care of your teeth and gums.  Regular physical activity.  Eating a healthy diet.  Avoiding tobacco and drug use.  Limiting alcohol use.  Practicing safe sex.  Taking low doses of aspirin every day.  Taking vitamin and mineral supplements as recommended by your health care provider. What happens during an annual well check? The services and screenings done by your health care provider during your annual well check will depend on your age, overall health, lifestyle risk factors, and family history of disease. Counseling  Your health care provider may ask you questions about your:  Alcohol use.  Tobacco use.  Drug use.  Emotional well-being.  Home and relationship  well-being.  Sexual activity.  Eating habits.  History of falls.  Memory and ability to understand (cognition).  Work and work Statistician. Screening  You may have the following tests or measurements:  Height, weight, and BMI.  Blood pressure.  Lipid and cholesterol levels. These may be checked every 5 years, or more frequently if you are over 65 years old.  Skin check.  Lung cancer screening. You may have this screening every year starting at age 65 if you have a 30-pack-year history of smoking and currently smoke or have quit within the past 15 years.  Fecal occult blood test (FOBT) of the stool. You may have this test every year starting at age 85.  Flexible sigmoidoscopy or colonoscopy. You may have a sigmoidoscopy every 5 years or a colonoscopy every 10 years starting at age 5.  Prostate cancer screening. Recommendations will vary depending on your family history and other risks.  Hepatitis C blood test.  Hepatitis B blood test.  Sexually transmitted disease (STD) testing.  Diabetes screening. This is done by checking your blood sugar (glucose) after you have not eaten for a while (fasting). You may have this done every 1-3 years.  Abdominal aortic aneurysm (AAA) screening. You may need this if you are a current or former smoker.  Osteoporosis. You may be screened starting at age 30 if you are at high risk. Talk with your health care provider about your test results, treatment options, and if necessary, the need for more tests. Vaccines  Your health care provider may recommend certain vaccines, such as:  Influenza vaccine. This is recommended every year.  Tetanus, diphtheria, and acellular pertussis (Tdap, Td) vaccine. You  may need a Td booster every 10 years.  Zoster vaccine. You may need this after age 82.  Pneumococcal 13-valent conjugate (PCV13) vaccine. One dose is recommended after age 79.  Pneumococcal polysaccharide (PPSV23) vaccine. One dose is  recommended after age 81. Talk to your health care provider about which screenings and vaccines you need and how often you need them. This information is not intended to replace advice given to you by your health care provider. Make sure you discuss any questions you have with your health care provider. Document Released: 01/12/2016 Document Revised: 09/04/2016 Document Reviewed: 10/17/2015 Elsevier Interactive Patient Education  2017 [place] Prevention in the Home Falls can cause injuries. They can happen to people of all ages. There are many things you can do to make your home safe and to help prevent falls. What can I do on the outside of my home?  Regularly fix the edges of walkways and driveways and fix any cracks.  Remove anything that might make you trip as you walk through a door, such as a raised step or threshold.  Trim any bushes or trees on the path to your home.  Use bright outdoor lighting.  Clear any walking paths of anything that might make someone trip, such as rocks or tools.  Regularly check to see if handrails are loose or broken. Make sure that both sides of any steps have handrails.  Any raised decks and porches should have guardrails on the edges.  Have any leaves, snow, or ice cleared regularly.  Use sand or salt on walking paths during winter.  Clean up any spills in your garage right away. This includes oil or grease spills. What can I do in the bathroom?  Use night lights.  Install grab bars by the toilet and in the tub and shower. Do not use towel bars as grab bars.  Use non-skid mats or decals in the tub or shower.  If you need to sit down in the shower, use a plastic, non-slip stool.  Keep the floor dry. Clean up any water that spills on the floor as soon as it happens.  Remove soap buildup in the tub or shower regularly.  Attach bath mats securely with double-sided non-slip rug tape.  Do not have throw rugs and other things on  the floor that can make you trip. What can I do in the bedroom?  Use night lights.  Make sure that you have a light by your bed that is easy to reach.  Do not use any sheets or blankets that are too big for your bed. They should not hang down onto the floor.  Have a firm chair that has side arms. You can use this for support while you get dressed.  Do not have throw rugs and other things on the floor that can make you trip. What can I do in the kitchen?  Clean up any spills right away.  Avoid walking on wet floors.  Keep items that you use a lot in easy-to-reach places.  If you need to reach something above you, use a strong step stool that has a grab bar.  Keep electrical cords out of the way.  Do not use floor polish or wax that makes floors slippery. If you must use wax, use non-skid floor wax.  Do not have throw rugs and other things on the floor that can make you trip. What can I do with my stairs?  Do not leave any items  on the stairs.  Make sure that there are handrails on both sides of the stairs and use them. Fix handrails that are broken or loose. Make sure that handrails are as long as the stairways.  Check any carpeting to make sure that it is firmly attached to the stairs. Fix any carpet that is loose or worn.  Avoid having throw rugs at the top or bottom of the stairs. If you do have throw rugs, attach them to the floor with carpet tape.  Make sure that you have a light switch at the top of the stairs and the bottom of the stairs. If you do not have them, ask someone to add them for you. What else can I do to help prevent falls?  Wear shoes that:  Do not have high heels.  Have rubber bottoms.  Are comfortable and fit you well.  Are closed at the toe. Do not wear sandals.  If you use a stepladder:  Make sure that it is fully opened. Do not climb a closed stepladder.  Make sure that both sides of the stepladder are locked into place.  Ask someone to  hold it for you, if possible.  Clearly mark and make sure that you can see:  Any grab bars or handrails.  First and last steps.  Where the edge of each step is.  Use tools that help you move around (mobility aids) if they are needed. These include:  Canes.  Walkers.  Scooters.  Crutches.  Turn on the lights when you go into a dark area. Replace any light bulbs as soon as they burn out.  Set up your furniture so you have a clear path. Avoid moving your furniture around.  If any of your floors are uneven, fix them.  If there are any pets around you, be aware of where they are.  Review your medicines with your doctor. Some medicines can make you feel dizzy. This can increase your chance of falling. Ask your doctor what other things that you can do to help prevent falls. This information is not intended to replace advice given to you by your health care provider. Make sure you discuss any questions you have with your health care provider. Document Released: 10/12/2009 Document Revised: 05/23/2016 Document Reviewed: 01/20/2015 Elsevier Interactive Patient Education  2017 [employer].

## 2019-10-29 ENCOUNTER — Other Ambulatory Visit: Payer: Self-pay | Admitting: *Deleted

## 2019-10-29 DIAGNOSIS — E785 Hyperlipidemia, unspecified: Secondary | ICD-10-CM

## 2019-10-29 NOTE — Telephone Encounter (Signed)
Received fax from [facility] and sent to Dr. Mariea Clonts for approval due to [place].

## 2019-10-30 ENCOUNTER — Other Ambulatory Visit: Payer: Self-pay | Admitting: Internal Medicine

## 2019-10-30 DIAGNOSIS — E785 Hyperlipidemia, unspecified: Secondary | ICD-10-CM

## 2019-10-30 MED ORDER — OMEGA-3-ACID ETHYL ESTERS 1 G PO CAPS: 1.0000 | ORAL_CAPSULE | Freq: Three times a day (TID) | ORAL | 1 refills | Status: DC

## 2019-10-30 NOTE — Progress Notes (Signed)
Rx had been set to print.  Reset to mail order and sent.  Printed Rx can be shredded.

## 2019-11-08 ENCOUNTER — Other Ambulatory Visit: Payer: Medicare Other

## 2019-11-08 ENCOUNTER — Other Ambulatory Visit: Payer: Self-pay

## 2019-11-08 DIAGNOSIS — E7800 Pure hypercholesterolemia, unspecified: Secondary | ICD-10-CM | POA: Diagnosis not present

## 2019-11-08 DIAGNOSIS — I10 Essential (primary) hypertension: Secondary | ICD-10-CM | POA: Diagnosis not present

## 2019-11-08 LAB — CBC WITH DIFFERENTIAL/PLATELET
Absolute Monocytes: 397 cells/uL (ref 200–950)
Basophils Absolute: 49 cells/uL (ref 0–200)
Basophils Relative: 0.8 %
Eosinophils Absolute: 140 cells/uL (ref 15–500)
Eosinophils Relative: 2.3 %
HCT: 40.8 % (ref 38.5–50.0)
Hemoglobin: 13.7 g/dL (ref 13.2–17.1)
Lymphs Abs: 1903 cells/uL (ref 850–3900)
MCH: 29.3 pg (ref 27.0–33.0)
MCHC: 33.6 g/dL (ref 32.0–36.0)
MCV: 87.4 fL (ref 80.0–100.0)
MPV: 11.1 fL (ref 7.5–12.5)
Monocytes Relative: 6.5 %
Neutro Abs: 3611 cells/uL (ref 1500–7800)
Neutrophils Relative %: 59.2 %
Platelets: 210 10*3/uL (ref 140–400)
RBC: 4.67 10*6/uL (ref 4.20–5.80)
RDW: 13.2 % (ref 11.0–15.0)
Total Lymphocyte: 31.2 %
WBC: 6.1 10*3/uL (ref 3.8–10.8)

## 2019-11-08 LAB — LIPID PANEL
Cholesterol: 119 mg/dL (ref <200)
HDL: 31 mg/dL — ABNORMAL LOW (ref >=40)
LDL Cholesterol (Calc): 68 mg/dL (calc)
Non-HDL Cholesterol (Calc): 88 mg/dL (calc) (ref <130)
Total CHOL/HDL Ratio: 3.8 (calc) (ref <5.0)
Triglycerides: 116 mg/dL (ref <150)

## 2019-11-08 LAB — COMPLETE METABOLIC PANEL WITH GFR
AG Ratio: 1.6 (calc) (ref 1.0–2.5)
ALT: 16 U/L (ref 9–46)
AST: 16 U/L (ref 10–35)
Albumin: 4.1 g/dL (ref 3.6–5.1)
Alkaline phosphatase (APISO): 62 U/L (ref 35–144)
BUN: 19 mg/dL (ref 7–25)
CO2: 31 mmol/L (ref 20–32)
Calcium: 9.2 mg/dL (ref 8.6–10.3)
Chloride: 102 mmol/L (ref 98–110)
Creat: 0.9 mg/dL (ref 0.70–1.18)
GFR, Est African American: 98 mL/min/{1.73_m2} (ref >=60)
GFR, Est Non African American: 84 mL/min/{1.73_m2} (ref >=60)
Globulin: 2.5 g/dL (calc) (ref 1.9–3.7)
Glucose, Bld: 82 mg/dL (ref 65–99)
Potassium: 3.9 mmol/L (ref 3.5–5.3)
Sodium: 141 mmol/L (ref 135–146)
Total Bilirubin: 0.7 mg/dL (ref 0.2–1.2)
Total Protein: 6.6 g/dL (ref 6.1–8.1)

## 2019-11-11 DIAGNOSIS — M9903 Segmental and somatic dysfunction of lumbar region: Secondary | ICD-10-CM | POA: Diagnosis not present

## 2019-11-11 DIAGNOSIS — M9905 Segmental and somatic dysfunction of pelvic region: Secondary | ICD-10-CM | POA: Diagnosis not present

## 2019-11-11 DIAGNOSIS — M9904 Segmental and somatic dysfunction of sacral region: Secondary | ICD-10-CM | POA: Diagnosis not present

## 2019-11-11 DIAGNOSIS — M5136 Other intervertebral disc degeneration, lumbar region: Secondary | ICD-10-CM | POA: Diagnosis not present

## 2019-11-15 ENCOUNTER — Other Ambulatory Visit: Payer: Self-pay

## 2019-11-15 ENCOUNTER — Ambulatory Visit (INDEPENDENT_AMBULATORY_CARE_PROVIDER_SITE_OTHER): Payer: Medicare Other | Admitting: Internal Medicine

## 2019-11-15 ENCOUNTER — Encounter: Payer: Self-pay | Admitting: Internal Medicine

## 2019-11-15 VITALS — BP 138/60 | HR 62 | Temp 98.4°F | Resp 20 | Ht 71.0 in | Wt 254.4 lb

## 2019-11-15 DIAGNOSIS — R739 Hyperglycemia, unspecified: Secondary | ICD-10-CM

## 2019-11-15 DIAGNOSIS — Z8739 Personal history of other diseases of the musculoskeletal system and connective tissue: Secondary | ICD-10-CM

## 2019-11-15 DIAGNOSIS — E7800 Pure hypercholesterolemia, unspecified: Secondary | ICD-10-CM

## 2019-11-15 DIAGNOSIS — I10 Essential (primary) hypertension: Secondary | ICD-10-CM

## 2019-11-15 DIAGNOSIS — M545 Low back pain, unspecified: Secondary | ICD-10-CM

## 2019-11-15 DIAGNOSIS — Z6835 Body mass index (BMI) 35.0-35.9, adult: Secondary | ICD-10-CM | POA: Diagnosis not present

## 2019-11-15 DIAGNOSIS — G8929 Other chronic pain: Secondary | ICD-10-CM

## 2019-11-15 NOTE — Progress Notes (Signed)
[facility]:  [facility]  Provider: Dr. Elmarie Shileyiffany  Code Status: DNR Goals of Care:  Advanced Directives 11/15/2019  Does Patient Have a Medical Advance Directive? Yes  Type of Advance Directive Out of facility DNR (pink MOST or yellow form)  Does patient want to make changes to medical advance directive? No - Patient declined  Copy of Healthcare Power of Attorney in Chart? -  Would patient like information on creating a medical advance directive? -  Pre-existing out of facility DNR order (yellow form or pink MOST form) Pink Most/Yellow Form available - Physician notified to receive inpatient order     Chief Complaint  Patient presents with  . Medical Management of Chronic Issues    4 Month Follow Up     HPI: Patient is a 73 y.o. male seen today for medical management of chronic diseases.    He states he is doing well.   He states he has a lack of activity. Claims this is why he has gained 9 pounds since September. He has cut eating at night. [employer] has re[facility], he has been going. Upset his stamina is poor. Will be fatigued for 2 days after working out.   Getting dentures. He is halfway through with procedure. His current office is closed. Does not know when office will reopen.   Having trouble seeing with his left eye. Goes to see opthmalogist tomorrow.   His diet varies with food. He is not eating out as much as he use to, pre-covid. He tries to limit sodium and fats in his diet. He denies any sugary drinks. Takes his blood pressure medication daily. Denies any headaches, dizziness, or blurred vision.   No recent falls of injuries.   Still seeing chiropractor for his back pain. Does not complain of any severe back pain. He will do is stretches and exercises for his back daily. Denies any numbness or tingling down his legs.           Past Medical History:  Diagnosis Date  . Atrial septal defect    corrected in 1962  . Cataracts, bilateral    "early stages"  .  Hypercholesteremia   . Hypertension    Sees Dr. Dorris Fetchiffany Reid  . Impaired hearing   . Impaired vision    glasses  . Low back pain   . Osteoarthritis of both knees    left knee  . Rheumatic fever    at 73 years old  . Shoulder pain    left, sees chiropracter for   . Stroke ([facility]) 03/14/2008   pt states that he may have had a mild stroke 1 week after knee surgery    Past Surgical History:  Procedure Laterality Date  . APPENDECTOMY  1979  . ASD REPAIR    . ASD REPAIR    . CATARACT EXTRACTION Left    May 2017  . CATARACT EXTRACTION Right    May 2017  . KNEE SURGERY     4 surgeries on right knee  . plantar fascititis     bilateral surgery   . TONSILLECTOMY  1954   and removal of adnoids  . TOTAL KNEE ARTHROPLASTY  2009   right knee, Dr.Randal   . TOTAL KNEE ARTHROPLASTY  01/15/2013   Procedure: TOTAL KNEE ARTHROPLASTY;  Surgeon: Thera FlakeW D Caffrey Jr., MD;  Location: [facility] OR;  Service: Orthopedics;  Laterality: Left;  left total knee arthroplasty    No Known Allergies  Outpatient Encounter Medications as of 11/15/2019  Medication Sig  . allopurinol (ZYLOPRIM) 100 MG tablet Take 100 mg by mouth as needed.  Marland Kitchen aspirin 81 MG tablet Take 81 mg by mouth daily.  Marland Kitchen atorvastatin (LIPITOR) 40 MG tablet TAKE 1 TABLET BY MOUTH AT BEDTIME. GENERIC EQUIVALENT FOR LIPITOR  . lisinopril-hydrochlorothiazide (ZESTORETIC) 20-25 MG tablet TAKE 1 TABLET BY MOUTH DAILY  . omega-3 acid ethyl esters (LOVAZA) 1 g capsule Take 1 capsule (1 g total) by mouth 3 (three) times daily.   No facility-administered encounter medications on file as of 11/15/2019.     Review of Systems:  Review of Systems  Constitutional: Negative for activity change, appetite change, fatigue and fever.  HENT: Negative for dental problem, hearing loss and trouble swallowing.   Eyes: Negative for photophobia and visual disturbance.  Respiratory: Negative for cough, shortness of breath and wheezing.   Cardiovascular: Negative for  chest pain and palpitations.  Gastrointestinal: Negative for abdominal pain, constipation, diarrhea and nausea.  Endocrine: Negative for polydipsia, polyphagia and polyuria.  Genitourinary: Positive for frequency. Negative for dysuria and hematuria.  Musculoskeletal: Positive for back pain.  Skin:       Scattered warts  Neurological: Negative for dizziness and headaches.  Psychiatric/Behavioral: Negative for dysphoric mood and sleep disturbance. The patient is not nervous/anxious.     Health Maintenance  Topic Date Due  . COLONOSCOPY  03/18/2021  . TETANUS/TDAP  02/26/2022  . INFLUENZA VACCINE  Completed  . Hepatitis C Screening  Completed  . PNA vac Low Risk Adult  Completed    Physical Exam: Vitals:   11/15/19 1042  BP: 138/60  Pulse: 62  Resp: 20  Temp: 98.4 F (36.9 C)  TempSrc: Oral  SpO2: 98%  Weight: 254 lb 6.4 oz (115.4 kg)  Height: 5\' 11"  (1.803 m)   Body mass index is 35.48 kg/m. Physical Exam Vitals signs reviewed.  Constitutional:      Appearance: Normal appearance. He is normal weight.  Cardiovascular:     Rate and Rhythm: Normal rate and regular rhythm.     Pulses: Normal pulses.     Heart sounds: Normal heart sounds. No murmur.  Pulmonary:     Effort: Pulmonary effort is normal. No respiratory distress.     Breath sounds: Normal breath sounds. No wheezing.  Abdominal:     General: Abdomen is flat. Bowel sounds are normal. There is no distension.     Palpations: Abdomen is soft.     Tenderness: There is no abdominal tenderness.  Musculoskeletal: Normal range of motion.     Right lower leg: No edema.     Left lower leg: No edema.  Skin:    General: Skin is warm and dry.     Capillary Refill: Capillary refill takes less than 2 seconds.  Neurological:     General: No focal deficit present.     Mental Status: He is alert and oriented to person, place, and time. Mental status is at baseline.  Psychiatric:        Mood and Affect: Mood normal.         Behavior: Behavior normal.        Thought Content: Thought content normal.        Judgment: Judgment normal.     Labs reviewed: Basic Metabolic Panel: Recent Labs    04/26/19 0858 11/08/19 0820  NA 140 141  K 4.2 3.9  CL 100 102  CO2 33* 31  GLUCOSE 92 82  BUN 18 19  CREATININE 0.98 0.90  CALCIUM 9.5 9.2   Liver Function Tests: Recent Labs    11/08/19 0820  AST 16  ALT 16  BILITOT 0.7  PROT 6.6   No results for input(s): LIPASE, AMYLASE in the last 8760 hours. No results for input(s): AMMONIA in the last 8760 hours. CBC: Recent Labs    04/26/19 0858 11/08/19 0820  WBC 6.5 6.1  NEUTROABS 3,816 3,611  HGB 14.2 13.7  HCT 42.0 40.8  MCV 86.4 87.4  PLT 205 210   Lipid Panel: Recent Labs    04/26/19 0858 11/08/19 0820  CHOL 122 119  HDL 34* 31*  LDLCALC 67 68  TRIG 120 116  CHOLHDL 3.6 3.8   Lab Results  Component Value Date   HGBA1C 5.6 04/26/2019    Procedures since last visit: No results found.  Assessment/Plan 1. Pure hypercholesterolemia - LDL 68, at goal - continue current statin regimen - continue to avoid foods high in fats and friend food - lipid panel- future  2. Essential hypertension, benign - bp at goal of <150/90 - continue current medication regimen -recommend DASH diet - complete blood count with differential/platelets- future - complete metabolic panel- future  3. BMI 35.0-35.9, adult - has gained 9 pounds since last visit - continue exercise at [facility] - recommend 240 minutes/week of exercise - recommend lighter exercise routine like walking or biking on days after vigorous exercise session - continue to avoid late night eating - recommend limiting snacks between meals - recommend calorie counting or weight loss program   4. Hyperglycemia - glucose at goal of <100 - continue to limit foods high in carbs or sugars - recommend weight loss - hemoglobin A1C- future  5. Chronic midline low back pain without sciatica -  followed by chiropractor,  Dr. Glendale Chard - denies progressive symptoms, stable at this time  6. History of gout - not taking allopurinol often - no recent episodes reported - continue current allopurinol 100 mg PRN reigmen    Labs/tests ordered:  Complete blood panel with differential/platelets, complete metabolic panel, lipid panel, hemoglobin A1C- future Next appt:  6 month follow up

## 2020-01-06 DIAGNOSIS — M9904 Segmental and somatic dysfunction of sacral region: Secondary | ICD-10-CM | POA: Diagnosis not present

## 2020-01-06 DIAGNOSIS — M9905 Segmental and somatic dysfunction of pelvic region: Secondary | ICD-10-CM | POA: Diagnosis not present

## 2020-01-06 DIAGNOSIS — M5136 Other intervertebral disc degeneration, lumbar region: Secondary | ICD-10-CM | POA: Diagnosis not present

## 2020-01-06 DIAGNOSIS — M9903 Segmental and somatic dysfunction of lumbar region: Secondary | ICD-10-CM | POA: Diagnosis not present

## 2020-01-26 ENCOUNTER — Ambulatory Visit (INDEPENDENT_AMBULATORY_CARE_PROVIDER_SITE_OTHER): Payer: Medicare Other | Admitting: Family

## 2020-01-26 ENCOUNTER — Encounter: Payer: Self-pay | Admitting: Family

## 2020-01-26 ENCOUNTER — Ambulatory Visit
Admission: RE | Admit: 2020-01-26 | Discharge: 2020-01-26 | Disposition: A | Discharge status: Home / Self Care | Payer: Medicare Other | Source: Ambulatory Visit | Attending: Family | Admitting: Family

## 2020-01-26 ENCOUNTER — Other Ambulatory Visit: Payer: Self-pay

## 2020-01-26 VITALS — BP 140/80 | HR 52 | Temp 97.7°F | Ht 71.0 in | Wt 260.0 lb

## 2020-01-26 DIAGNOSIS — M79644 Pain in right finger(s): Secondary | ICD-10-CM

## 2020-01-26 DIAGNOSIS — M19041 Primary osteoarthritis, right hand: Secondary | ICD-10-CM | POA: Diagnosis not present

## 2020-01-26 DIAGNOSIS — M7989 Other specified soft tissue disorders: Secondary | ICD-10-CM

## 2020-01-26 NOTE — Progress Notes (Signed)
Provider: Bryndan Bilyk FNP-C  Gayland Curry, DO  Patient Care Team: Gayland Curry, DO as PCP - General (Geriatric Medicine)  Extended Emergency Contact Information Primary Emergency Contact: [place] Phone: [phone] Relation: Other Secondary Emergency Contact: Davis,Scott  [place] Phone: [phone] Relation: Other  Code Status:  DNR Goals of care: Advanced Directive information Advanced Directives 11/15/2019  Does Patient Have a Medical Advance Directive? Yes  Type of Advance Directive Out of facility DNR (pink MOST or yellow form)  Does patient want to make changes to medical advance directive? No - Patient declined  Copy of [place] in Chart? -  Would patient like information on creating a medical advance directive? -  Pre-existing out of facility DNR order (yellow form or pink MOST form) Pink Most/Yellow Form available - Physician notified to receive inpatient order     Chief Complaint  Patient presents with  . Acute Visit    swollen finger x1 week right hand    HPI:  Pt is a 74 y.o. male seen today for an acute visit for evaluation of swollen finger x1 week right hand.Unclear what could have caused the swelling of the hand.He recalls playing golf but not sure whether he injured hand.Had swelling of hand on Friday 01/21/20 which improved after he applied ice.Also took ibuprofen for a couple of days.swelling has improved on the hand but persist on the 5 th finger.Has had no pain but slightly tender on the base of 5 th finger.Unable to close fingers or make a fist due to swelling on 5 th finger.He denies any fever,chills,redness or drainage.Swelling has limited his exercise routine.   Past Medical History:  Diagnosis Date  . Atrial septal defect    corrected in 1962  . Cataracts, bilateral    "early stages"  . Hypercholesteremia   . Hypertension    Sees Dr. Jose Persia  .  Impaired hearing   . Impaired vision    glasses  . Low back pain   . Osteoarthritis of both knees    left knee  . Rheumatic fever    at 74 years old  . Shoulder pain    left, sees chiropracter for   . Stroke ([place]) 03/14/2008   pt states that he may have had a mild stroke 1 week after knee surgery   Past Surgical History:  Procedure Laterality Date  . APPENDECTOMY  1979  . ASD REPAIR    . ASD REPAIR    . CATARACT EXTRACTION Left    May 2017  . CATARACT EXTRACTION Right    May 2017  . KNEE SURGERY     4 surgeries on right knee  . plantar fascititis     bilateral surgery   . TONSILLECTOMY  1954   and removal of adnoids  . TOTAL KNEE ARTHROPLASTY  2009   right knee, Dr.Randal   . TOTAL KNEE ARTHROPLASTY  01/15/2013   Procedure: TOTAL KNEE ARTHROPLASTY;  Surgeon: Yvette Rack., MD;  Location: [facility];  Service: Orthopedics;  Laterality: Left;  left total knee arthroplasty    No Known Allergies  Outpatient Encounter Medications as of 01/26/2020  Medication Sig  . allopurinol (ZYLOPRIM) 100 MG tablet Take 100 mg by mouth as needed.  Marland Kitchen aspirin 81 MG tablet Take 81 mg by mouth daily.  Marland Kitchen atorvastatin (LIPITOR) 40 MG tablet TAKE 1 TABLET BY MOUTH AT BEDTIME. GENERIC EQUIVALENT FOR LIPITOR  . lisinopril-hydrochlorothiazide (  ZESTORETIC) 20-25 MG tablet TAKE 1 TABLET BY MOUTH DAILY  . omega-3 acid ethyl esters (LOVAZA) 1 g capsule Take 1 capsule (1 g total) by mouth 3 (three) times daily.   No facility-administered encounter medications on file as of 01/26/2020.    Review of Systems  Constitutional: Negative for appetite change, chills, fatigue and fever.  Respiratory: Negative for cough, chest tightness, shortness of breath and wheezing.   Cardiovascular: Negative for chest pain, palpitations and leg swelling.  Musculoskeletal: Positive for arthralgias and joint swelling. Negative for gait problem.        swelling on Right 5 th finger   Skin: Negative for color change, pallor, rash  and wound.  Neurological: Negative for dizziness, weakness, numbness and headaches.   Immunization History  Administered Date(s) Administered  . Fluad Quad(high Dose 65+) 09/24/2019  . Influenza Whole 08/29/2011, 12/03/2012  . Influenza, High Dose Seasonal PF 09/29/2017, 10/26/2018  . Influenza,inj,Quad PF,6+ Mos 12/09/2013, 09/02/2014, 09/08/2015, 11/25/2016  . Pneumococcal Conjugate-13 01/06/2015  . Pneumococcal Polysaccharide-23 02/27/2012  . Tdap 02/27/2012  . Zoster 05/28/2012   Pertinent  Health Maintenance Due  Topic Date Due  . COLONOSCOPY  03/18/2021  . INFLUENZA VACCINE  Completed  . PNA vac Low Risk Adult  Completed   Fall Risk  01/26/2020 11/15/2019 10/25/2019 07/26/2019 05/03/2019  Falls in the past year? 0 0 0 0 0  Number falls in past yr: 0 0 0 0 0  Injury with Fall? 0 - 0 0 0    Vitals:   01/26/20 1059  BP: 140/80  Pulse: (!) 52  Temp: 97.7 F (36.5 C)  TempSrc: Oral  SpO2: 97%  Weight: 260 lb (117.9 kg)  Height: 5\' 11"  (1.803 m)   Body mass index is 36.26 kg/m. Physical Exam Vitals reviewed.  Constitutional:      General: He is not in acute distress.    Appearance: He is obese. He is not ill-appearing.  HENT:     Head: Normocephalic.  Cardiovascular:     Rate and Rhythm: Normal rate and regular rhythm.     Pulses: Normal pulses.     Heart sounds: Normal heart sounds. No murmur. No friction rub. No gallop.   Pulmonary:     Effort: Pulmonary effort is normal. No respiratory distress.     Breath sounds: Normal breath sounds. No wheezing, rhonchi or rales.  Chest:     Chest wall: No tenderness.  Musculoskeletal:     Right hand: Swelling and tenderness present. No deformity. Decreased range of motion. Normal strength. Normal sensation. Normal capillary refill. Normal pulse.     Left hand: Normal.       Arms:     Comments: Right 5 th finger swelling,tender to palpation on metacarpophalangeal joint and decreased ROM.No bruising or redness noted     Skin:    General: Skin is warm and dry.     Coloration: Skin is not pale.     Findings: No bruising or erythema.  Neurological:     Mental Status: He is alert and oriented to person, place, and time.     Cranial Nerves: No cranial nerve deficit.     Sensory: No sensory deficit.     Motor: No weakness.     Gait: Gait normal.  Psychiatric:        Mood and Affect: Mood normal.        Behavior: Behavior normal.        Thought Content: Thought content normal.  Judgment: Judgment normal.    Labs reviewed: Recent Labs    04/26/19 0858 11/08/19 0820  NA 140 141  K 4.2 3.9  CL 100 102  CO2 33* 31  GLUCOSE 92 82  BUN 18 19  CREATININE 0.98 0.90  CALCIUM 9.5 9.2   Recent Labs    11/08/19 0820  AST 16  ALT 16  BILITOT 0.7  PROT 6.6   Recent Labs    04/26/19 0858 11/08/19 0820  WBC 6.5 6.1  NEUTROABS 3,816 3,611  HGB 14.2 13.7  HCT 42.0 40.8  MCV 86.4 87.4  PLT 205 210    Lab Results  Component Value Date   HGBA1C 5.6 04/26/2019   Lab Results  Component Value Date   CHOL 119 11/08/2019   HDL 31 (L) 11/08/2019   LDLCALC 68 11/08/2019   TRIG 116 11/08/2019   CHOLHDL 3.8 11/08/2019    Significant Diagnostic Results in last 30 days:  No results found.  Assessment/Plan 1. Swelling of right little finger Unclear etiology.suspected injury from playing Golf. - Apply ice compressor to right finger 5-10 minutes at least once daily and as needed  - continue with Ibuprofen every 400 -800 mg tablet every 8 hours as needed for pain.Take with food.  - Notify provider for any worsening symptoms or redness  - Avoid playing golf until swelling and tenderness resolve  - DG Hand Complete Right; Future -  Please right hand X-ray done at [address] [place] at [facility].Will call you with results.  2. Pain in finger of right hand Afebrile.metacarpophalangeal joint tender to palpation.No signs of cellulitis.continue on Ibuprofen every 400 -800 mg  tablet every 8 hours as needed for pain.Take with food.  - Notify provider if symptoms fail to resolve or worsen.   Family/ staff Communication: Reviewed plan of care with patient  Labs/tests ordered: None   Next Appointment: as needed.   Caesar Bookman, NP

## 2020-01-26 NOTE — Patient Instructions (Addendum)
-   Please right hand X-ray done at [address] [place] at [facility].Will call you with results. - Apply ice compressor to right finger 5-10 minutes at least once daily and as needed  - continue with Ibuprofen every 400 -800 mg tablet every 8 hours as needed for pain.Take with food.  - Notify provider for any worsening symptoms or redness  - Avoid playing golf until swelling and tenderness resolve

## 2020-02-13 ENCOUNTER — Other Ambulatory Visit: Payer: Self-pay | Admitting: Internal Medicine

## 2020-03-15 DIAGNOSIS — M9904 Segmental and somatic dysfunction of sacral region: Secondary | ICD-10-CM | POA: Diagnosis not present

## 2020-03-15 DIAGNOSIS — M9905 Segmental and somatic dysfunction of pelvic region: Secondary | ICD-10-CM | POA: Diagnosis not present

## 2020-03-15 DIAGNOSIS — M9903 Segmental and somatic dysfunction of lumbar region: Secondary | ICD-10-CM | POA: Diagnosis not present

## 2020-03-15 DIAGNOSIS — M5136 Other intervertebral disc degeneration, lumbar region: Secondary | ICD-10-CM | POA: Diagnosis not present

## 2020-04-08 ENCOUNTER — Other Ambulatory Visit: Payer: Self-pay | Admitting: Internal Medicine

## 2020-04-21 ENCOUNTER — Other Ambulatory Visit: Payer: Self-pay | Admitting: *Deleted

## 2020-04-21 MED ORDER — LISINOPRIL-HYDROCHLOROTHIAZIDE 20-25 MG PO TABS: 1.0000 | ORAL_TABLET | Freq: Every day | ORAL | 1 refills | Status: DC

## 2020-04-21 NOTE — Telephone Encounter (Signed)
Patient requested refill to be sent to [facility]ax[facility].

## 2020-05-15 ENCOUNTER — Encounter: Payer: Self-pay | Admitting: Internal Medicine

## 2020-05-15 ENCOUNTER — Ambulatory Visit (INDEPENDENT_AMBULATORY_CARE_PROVIDER_SITE_OTHER): Payer: Medicare Other | Admitting: Internal Medicine

## 2020-05-15 ENCOUNTER — Other Ambulatory Visit: Payer: Self-pay

## 2020-05-15 VITALS — BP 122/82 | HR 54 | Temp 97.3°F | Ht 71.0 in | Wt 259.0 lb

## 2020-05-15 DIAGNOSIS — M8949 Other hypertrophic osteoarthropathy, multiple sites: Secondary | ICD-10-CM

## 2020-05-15 DIAGNOSIS — I10 Essential (primary) hypertension: Secondary | ICD-10-CM | POA: Diagnosis not present

## 2020-05-15 DIAGNOSIS — H538 Other visual disturbances: Secondary | ICD-10-CM

## 2020-05-15 DIAGNOSIS — R739 Hyperglycemia, unspecified: Secondary | ICD-10-CM

## 2020-05-15 DIAGNOSIS — S90822A Blister (nonthermal), left foot, initial encounter: Secondary | ICD-10-CM

## 2020-05-15 DIAGNOSIS — E7800 Pure hypercholesterolemia, unspecified: Secondary | ICD-10-CM

## 2020-05-15 DIAGNOSIS — M159 Polyosteoarthritis, unspecified: Secondary | ICD-10-CM

## 2020-05-15 MED ORDER — IBUPROFEN 600 MG PO TABS: 600.0000 mg | ORAL_TABLET | Freq: Every day | ORAL | 0 refills | Status: DC | PRN

## 2020-05-15 NOTE — Progress Notes (Signed)
Location:  [facility] clinic Provider:  Aberdeen Hafen L. Renato Gails, D.O., C.M.D.  Code Status: DNR Goals of Care:  Advanced Directives 05/15/2020  Does Patient Have a Medical Advance Directive? Yes  Type of Advance Directive Out of facility DNR (pink MOST or yellow form)  Does patient want to make changes to medical advance directive? No - Patient declined  Copy of Healthcare Power of Attorney in Chart? -  Would patient like information on creating a medical advance directive? -  Pre-existing out of facility DNR order (yellow form or pink MOST form) -     Chief Complaint  Patient presents with  . Medical Management of Chronic Issues    6 month follow up     HPI: Patient is a 74 y.o. male seen today for medical management of chronic diseases.    It's been a fun month. They went to [facility] tournament and elite eight and did some breakfasts at [facility] that his friend with lewy body dementia loves.    Two weeks ago, he was in [place] to the [facility] [employer].  At well, but did have to walk and stand a lot.  Rented a bike.  He didn't gain weight there, but his weight has gone up since he returned home and has been "lazy".  He had worked to build up his muscles and he has been having more arthritis.    He'd like to take ibuprofen when he plays golf and gets sore.  Right hand will get so swollen he cannot hang onto the club.   Reviewed importance of only prn use.  He says he would not today.    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   He gets his eye exam in the fall.  When he got his new glasses and then renewed his exam.  Says left eye is always blurred and never focuses.  He could not read clearly with just that eye.  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     Played in new golf shoes Thursday--got a blister on his heel and thinks it needs to be cleaned and dressed.  It bled some on his sock.  Is now drying  up.  Past Medical History:  Diagnosis Date  . Atrial septal defect    corrected in 1962  . Cataracts, bilateral    "early stages"  . Hypercholesteremia   . Hypertension    Sees Dr. Dorris Fetch  . Impaired hearing   . Impaired vision    glasses  . Low back pain   . Osteoarthritis of both knees    left knee  . Rheumatic fever    at 74 years old  . Shoulder pain    left, sees chiropracter for   . Stroke (HCC) 03/14/2008   pt states that he may have had a mild stroke 1 week after knee surgery    Past Surgical History:  Procedure Laterality Date  . APPENDECTOMY  1979  . ASD REPAIR    . ASD REPAIR    . CATARACT EXTRACTION Left    May 2017  . CATARACT EXTRACTION Right    May 2017  . KNEE SURGERY     4 surgeries on right knee  . plantar fascititis     bilateral surgery   . TONSILLECTOMY  1954   and removal of adnoids  . TOTAL KNEE ARTHROPLASTY  2009  right knee, Dr.Randal   . TOTAL KNEE ARTHROPLASTY  01/15/2013   Procedure: TOTAL KNEE ARTHROPLASTY;  Surgeon: Thera Flake., MD;  Location: [facility] OR;  Service: Orthopedics;  Laterality: Left;  left total knee arthroplasty    No Known Allergies  Outpatient Encounter Medications as of 05/15/2020  Medication Sig  . allopurinol (ZYLOPRIM) 100 MG tablet Take 100 mg by mouth as needed.  Marland Kitchen aspirin 81 MG tablet Take 81 mg by mouth daily.  Marland Kitchen atorvastatin (LIPITOR) 40 MG tablet TAKE 1 TABLET BY MOUTH AT BEDTIME. GENERIC EQUIVALENT FOR LIPITOR  . lisinopril-hydrochlorothiazide (ZESTORETIC) 20-25 MG tablet Take 1 tablet by mouth daily.  Marland Kitchen omega-3 acid ethyl esters (LOVAZA) 1 g capsule Take 1 capsule (1 g total) by mouth 3 (three) times daily.   No facility-administered encounter medications on file as of 05/15/2020.    Review of Systems:  Review of Systems  Constitutional: Negative for chills, fever and malaise/fatigue.  HENT: Positive for hearing loss.   Eyes: Positive for blurred vision. Negative for double vision, photophobia,  pain, discharge and redness.       Left eye only  Respiratory: Negative for shortness of breath.   Cardiovascular: Negative for chest pain, palpitations and leg swelling.  Gastrointestinal: Negative for abdominal pain.  Genitourinary: Negative for dysuria.  Musculoskeletal: Positive for joint pain. Negative for back pain and falls.  Skin:       Blister left posterior heel  Neurological: Negative for dizziness and loss of consciousness.  Psychiatric/Behavioral: Negative for depression and memory loss. The patient is not nervous/anxious and does not have insomnia.     Health Maintenance  Topic Date Due  . COVID-19 Vaccine (1) Never done  . INFLUENZA VACCINE  07/30/2020  . COLONOSCOPY  03/18/2021  . TETANUS/TDAP  02/26/2022  . Hepatitis C Screening  Completed  . PNA vac Low Risk Adult  Completed    Physical Exam: Vitals:   05/15/20 0843  BP: 122/82  Pulse: (!) 54  Temp: (!) 97.3 F (36.3 C)  TempSrc: Temporal  SpO2: 96%  Weight: 259 lb (117.5 kg)  Height: 5\' 11"  (1.803 m)   Body mass index is 36.12 kg/m. Physical Exam Vitals reviewed.  Constitutional:      General: He is not in acute distress.    Appearance: Normal appearance. He is obese. He is not toxic-appearing.  HENT:     Head: Normocephalic and atraumatic.  Eyes:     Comments: Glasses, covering right eye--he is challenged to read his med list; ok with left covered  Cardiovascular:     Rate and Rhythm: Normal rate and regular rhythm.     Heart sounds: Murmur present.  Pulmonary:     Effort: Pulmonary effort is normal.     Breath sounds: Normal breath sounds. No wheezing, rhonchi or rales.  Abdominal:     General: Bowel sounds are normal.  Musculoskeletal:        General: Normal range of motion.     Right lower leg: No edema.     Left lower leg: No edema.  Skin:    General: Skin is warm and dry.     Comments: Left posterior heel with blister that has dried up with small scab superior aspect; mild erythema,  no drainage  Neurological:     General: No focal deficit present.     Mental Status: He is alert and oriented to person, place, and time.  Psychiatric:        Mood and  Affect: Mood normal.        Behavior: Behavior normal.        Thought Content: Thought content normal.        Judgment: Judgment normal.     Labs reviewed: Basic Metabolic Panel: Recent Labs    11/08/19 0820  NA 141  K 3.9  CL 102  CO2 31  GLUCOSE 82  BUN 19  CREATININE 0.90  CALCIUM 9.2   Liver Function Tests: Recent Labs    11/08/19 0820  AST 16  ALT 16  BILITOT 0.7  PROT 6.6   No results for input(s): LIPASE, AMYLASE in the last 8760 hours. No results for input(s): AMMONIA in the last 8760 hours. CBC: Recent Labs    11/08/19 0820  WBC 6.1  NEUTROABS 3,611  HGB 13.7  HCT 40.8  MCV 87.4  PLT 210   Lipid Panel: Recent Labs    11/08/19 0820  CHOL 119  HDL 31*  LDLCALC 68  TRIG 116  CHOLHDL 3.8   Lab Results  Component Value Date   HGBA1C 5.6 04/26/2019   Assessment/Plan 1. Primary osteoarthritis involving multiple joints -uses ibuprofen when having a bad day especially with pain when golfing--not daily - ibuprofen (ADVIL) 600 MG tablet; Take 1 tablet (600 mg total) by mouth daily as needed (severe pain).  Dispense: 90 tablet; Refill: 0  2. Blurry vision, left eye - new issue--has new Rx but not able to see well anyway with left eye so will refer for ophtho exam of back of eye--sees optometry in [place] only right now - Ambulatory referral to Ophthalmology  3. Essential hypertension, benign -bp is well controlled, cont same regimen - CBC with Differential/Platelet; Future - COMPLETE METABOLIC PANEL WITH GFR; Future  4. Pure hypercholesterolemia -last LDL was at goal in Nov, recheck in 6 mos - COMPLETE METABOLIC PANEL WITH GFR; Future - Lipid panel; Future  5. Hyperglycemia -cont to exercise regularly - Hemoglobin A1c; Future  6. Blister of left heel, initial encounter -was  cleaned and dressed for him  Labs/tests ordered:  Lab Orders     CBC with Differential/Platelet     COMPLETE METABOLIC PANEL WITH GFR     Lipid panel     Hemoglobin A1c  Next appt:  6 mos for CPE, fasting labs before  Elouise Divelbiss L. Rubye Strohmeyer, D.O. [facility] [address], [place] [address] Cell Phone (Mon-Fri 8am-5pm):  [phone] On Call:  [phone] & follow prompts after 5pm & weekends Office Phone:  [phone] Office Fax:  [phone]

## 2020-05-31 DIAGNOSIS — M5136 Other intervertebral disc degeneration, lumbar region: Secondary | ICD-10-CM | POA: Diagnosis not present

## 2020-05-31 DIAGNOSIS — M9904 Segmental and somatic dysfunction of sacral region: Secondary | ICD-10-CM | POA: Diagnosis not present

## 2020-05-31 DIAGNOSIS — M9905 Segmental and somatic dysfunction of pelvic region: Secondary | ICD-10-CM | POA: Diagnosis not present

## 2020-05-31 DIAGNOSIS — M9903 Segmental and somatic dysfunction of lumbar region: Secondary | ICD-10-CM | POA: Diagnosis not present

## 2020-06-27 DIAGNOSIS — H5203 Hypermetropia, bilateral: Secondary | ICD-10-CM | POA: Diagnosis not present

## 2020-06-27 DIAGNOSIS — H26492 Other secondary cataract, left eye: Secondary | ICD-10-CM | POA: Diagnosis not present

## 2020-06-27 DIAGNOSIS — H52203 Unspecified astigmatism, bilateral: Secondary | ICD-10-CM | POA: Diagnosis not present

## 2020-07-05 ENCOUNTER — Telehealth: Payer: Self-pay

## 2020-07-05 NOTE — Telephone Encounter (Signed)
He may be referring to zantac for acid reflux.  Please ask if this is it.

## 2020-07-05 NOTE — Telephone Encounter (Signed)
Called patient back to ask him if he was talking about the Zantac as Dr. Renato Gails suggested got his voice mail but he had said I could leave a voice message and I did and told him if this wasn't correct to please call the office and we would try to figure out which medication he was talking about

## 2020-07-05 NOTE — Telephone Encounter (Signed)
Patient called and wanted to know which medication he had been taking that Dean Fry and Dean Fry was producing that was removed from the market that he had been taking because he's going to have surgery and they asked him of medication he has had a reaction to but when I checked his chart I don't see anything about this  Please Advise what I should tell him

## 2020-07-12 DIAGNOSIS — H26492 Other secondary cataract, left eye: Secondary | ICD-10-CM | POA: Diagnosis not present

## 2020-07-31 DIAGNOSIS — H5203 Hypermetropia, bilateral: Secondary | ICD-10-CM | POA: Diagnosis not present

## 2020-08-16 ENCOUNTER — Other Ambulatory Visit: Payer: Self-pay | Admitting: Internal Medicine

## 2020-08-16 DIAGNOSIS — E785 Hyperlipidemia, unspecified: Secondary | ICD-10-CM

## 2020-08-17 DIAGNOSIS — M9903 Segmental and somatic dysfunction of lumbar region: Secondary | ICD-10-CM | POA: Diagnosis not present

## 2020-08-17 DIAGNOSIS — M9905 Segmental and somatic dysfunction of pelvic region: Secondary | ICD-10-CM | POA: Diagnosis not present

## 2020-08-17 DIAGNOSIS — M9904 Segmental and somatic dysfunction of sacral region: Secondary | ICD-10-CM | POA: Diagnosis not present

## 2020-08-17 DIAGNOSIS — M5136 Other intervertebral disc degeneration, lumbar region: Secondary | ICD-10-CM | POA: Diagnosis not present

## 2020-10-27 ENCOUNTER — Encounter: Payer: Self-pay | Admitting: Family

## 2020-10-27 ENCOUNTER — Ambulatory Visit (INDEPENDENT_AMBULATORY_CARE_PROVIDER_SITE_OTHER): Payer: Medicare Other | Admitting: Family

## 2020-10-27 ENCOUNTER — Telehealth: Payer: Self-pay

## 2020-10-27 ENCOUNTER — Other Ambulatory Visit: Payer: Self-pay

## 2020-10-27 DIAGNOSIS — Z0000 Encounter for general adult medical examination without abnormal findings: Secondary | ICD-10-CM

## 2020-10-27 NOTE — Progress Notes (Signed)
    This service is provided via telemedicine  No vital signs collected/recorded due to the encounter was a telemedicine visit.   Location of patient (ex: home, work): Home.   Patient consents to a telephone visit: Yes.  Location of the provider (ex: office, home):  [facility].  Name of any referring provider: Reed, Tiffany L, DO   Names of all persons participating in the telemedicine service and their role in the encounter: Patient, Yelena Metzer, RMA, Ngetich, Dinah, NP.    Time spent on call: 8 minutes spent on the phone with Medical Assistant.   

## 2020-10-27 NOTE — Patient Instructions (Signed)
[name] , Thank you for taking time to come for your Medicare Wellness Visit. I appreciate your ongoing commitment to your health goals. Please review the following plan we discussed and let me know if I can assist you in the future.   Screening recommendations/referrals: Colonoscopy Up to date due 03/18/2021  Recommended yearly ophthalmology/optometry visit for glaucoma screening and checkup Recommended yearly dental visit for hygiene and checkup  Vaccinations: Influenza vaccine: Due  Pneumococcal vaccine: Up to date  Tdap vaccine : Up to date  Shingles vaccine: Due    Advanced directives: Yes   Conditions/risks identified: Advance age male > 43 yrs,male Gender,BMI > 30,hx of smoking.    Next appointment: 1 year   Preventive Care 75 Years and Older, Male Preventive care refers to lifestyle choices and visits with your health care provider that can promote health and wellness. What does preventive care include?  A yearly physical exam. This is also called an annual well check.  Dental exams once or twice a year.  Routine eye exams. Ask your health care provider how often you should have your eyes checked.  Personal lifestyle choices, including:  Daily care of your teeth and gums.  Regular physical activity.  Eating a healthy diet.  Avoiding tobacco and drug use.  Limiting alcohol use.  Practicing safe sex.  Taking low doses of aspirin every day.  Taking vitamin and mineral supplements as recommended by your health care provider. What happens during an annual well check? The services and screenings done by your health care provider during your annual well check will depend on your age, overall health, lifestyle risk factors, and family history of disease. Counseling  Your health care provider may ask you questions about your:  Alcohol use.  Tobacco use.  Drug use.  Emotional well-being.  Home and relationship well-being.  Sexual activity.  Eating  habits.  History of falls.  Memory and ability to understand (cognition).  Work and work Astronomer. Screening  You may have the following tests or measurements:  Height, weight, and BMI.  Blood pressure.  Lipid and cholesterol levels. These may be checked every 5 years, or more frequently if you are over 65 years old.  Skin check.  Lung cancer screening. You may have this screening every year starting at age 13 if you have a 30-pack-year history of smoking and currently smoke or have quit within the past 15 years.  Fecal occult blood test (FOBT) of the stool. You may have this test every year starting at age 50.  Flexible sigmoidoscopy or colonoscopy. You may have a sigmoidoscopy every 5 years or a colonoscopy every 10 years starting at age 48.  Prostate cancer screening. Recommendations will vary depending on your family history and other risks.  Hepatitis C blood test.  Hepatitis B blood test.  Sexually transmitted disease (STD) testing.  Diabetes screening. This is done by checking your blood sugar (glucose) after you have not eaten for a while (fasting). You may have this done every 1-3 years.  Abdominal aortic aneurysm (AAA) screening. You may need this if you are a current or former smoker.  Osteoporosis. You may be screened starting at age 36 if you are at high risk. Talk with your health care provider about your test results, treatment options, and if necessary, the need for more tests. Vaccines  Your health care provider may recommend certain vaccines, such as:  Influenza vaccine. This is recommended every year.  Tetanus, diphtheria, and acellular pertussis (Tdap, Td)  vaccine. You may need a Td booster every 10 years.  Zoster vaccine. You may need this after age 15.  Pneumococcal 13-valent conjugate (PCV13) vaccine. One dose is recommended after age 6.  Pneumococcal polysaccharide (PPSV23) vaccine. One dose is recommended after age 62. Talk to your health  care provider about which screenings and vaccines you need and how often you need them. This information is not intended to replace advice given to you by your health care provider. Make sure you discuss any questions you have with your health care provider. Document Released: 01/12/2016 Document Revised: 09/04/2016 Document Reviewed: 10/17/2015 Elsevier Interactive Patient Education  2017 [place] Prevention in the Home Falls can cause injuries. They can happen to people of all ages. There are many things you can do to make your home safe and to help prevent falls. What can I do on the outside of my home?  Regularly fix the edges of walkways and driveways and fix any cracks.  Remove anything that might make you trip as you walk through a door, such as a raised step or threshold.  Trim any bushes or trees on the path to your home.  Use bright outdoor lighting.  Clear any walking paths of anything that might make someone trip, such as rocks or tools.  Regularly check to see if handrails are loose or broken. Make sure that both sides of any steps have handrails.  Any raised decks and porches should have guardrails on the edges.  Have any leaves, snow, or ice cleared regularly.  Use sand or salt on walking paths during winter.  Clean up any spills in your garage right away. This includes oil or grease spills. What can I do in the bathroom?  Use night lights.  Install grab bars by the toilet and in the tub and shower. Do not use towel bars as grab bars.  Use non-skid mats or decals in the tub or shower.  If you need to sit down in the shower, use a plastic, non-slip stool.  Keep the floor dry. Clean up any water that spills on the floor as soon as it happens.  Remove soap buildup in the tub or shower regularly.  Attach bath mats securely with double-sided non-slip rug tape.  Do not have throw rugs and other things on the floor that can make you trip. What can I do  in the bedroom?  Use night lights.  Make sure that you have a light by your bed that is easy to reach.  Do not use any sheets or blankets that are too big for your bed. They should not hang down onto the floor.  Have a firm chair that has side arms. You can use this for support while you get dressed.  Do not have throw rugs and other things on the floor that can make you trip. What can I do in the kitchen?  Clean up any spills right away.  Avoid walking on wet floors.  Keep items that you use a lot in easy-to-reach places.  If you need to reach something above you, use a strong step stool that has a grab bar.  Keep electrical cords out of the way.  Do not use floor polish or wax that makes floors slippery. If you must use wax, use non-skid floor wax.  Do not have throw rugs and other things on the floor that can make you trip. What can I do with my stairs?  Do not leave  any items on the stairs.  Make sure that there are handrails on both sides of the stairs and use them. Fix handrails that are broken or loose. Make sure that handrails are as long as the stairways.  Check any carpeting to make sure that it is firmly attached to the stairs. Fix any carpet that is loose or worn.  Avoid having throw rugs at the top or bottom of the stairs. If you do have throw rugs, attach them to the floor with carpet tape.  Make sure that you have a light switch at the top of the stairs and the bottom of the stairs. If you do not have them, ask someone to add them for you. What else can I do to help prevent falls?  Wear shoes that:  Do not have high heels.  Have rubber bottoms.  Are comfortable and fit you well.  Are closed at the toe. Do not wear sandals.  If you use a stepladder:  Make sure that it is fully opened. Do not climb a closed stepladder.  Make sure that both sides of the stepladder are locked into place.  Ask someone to hold it for you, if possible.  Clearly mark  and make sure that you can see:  Any grab bars or handrails.  First and last steps.  Where the edge of each step is.  Use tools that help you move around (mobility aids) if they are needed. These include:  Canes.  Walkers.  Scooters.  Crutches.  Turn on the lights when you go into a dark area. Replace any light bulbs as soon as they burn out.  Set up your furniture so you have a clear path. Avoid moving your furniture around.  If any of your floors are uneven, fix them.  If there are any pets around you, be aware of where they are.  Review your medicines with your doctor. Some medicines can make you feel dizzy. This can increase your chance of falling. Ask your doctor what other things that you can do to help prevent falls. This information is not intended to replace advice given to you by your health care provider. Make sure you discuss any questions you have with your health care provider. Document Released: 10/12/2009 Document Revised: 05/23/2016 Document Reviewed: 01/20/2015 Elsevier Interactive Patient Education  2017 [employer].

## 2020-10-27 NOTE — Telephone Encounter (Signed)
Mr. dean, goldner are scheduled for a virtual visit with your provider today.    Just as we do with appointments in the office, we must obtain your consent to participate.  Your consent will be active for this visit and any virtual visit you may have with one of our providers in the next 365 days.    If you have a MyChart account, I can also send a copy of this consent to you electronically.  All virtual visits are billed to your insurance company just like a traditional visit in the office.  As this is a virtual visit, video technology does not allow for your provider to perform a traditional examination.  This may limit your provider's ability to fully assess your condition.  If your provider identifies any concerns that need to be evaluated in person or the need to arrange testing such as labs, EKG, etc, we will make arrangements to do so.    Although advances in technology are sophisticated, we cannot ensure that it will always work on either your end or our end.  If the connection with a video visit is poor, we may have to switch to a telephone visit.  With either a video or telephone visit, we are not always able to ensure that we have a secure connection.   I need to obtain your verbal consent now.   Are you willing to proceed with your visit today?   Dean Fry has provided verbal consent on 10/27/2020 for a virtual visit (video or telephone).   [name], [place] 10/27/2020  10:47 AM

## 2020-10-27 NOTE — Progress Notes (Signed)
Subjective:   Dean Fry is a 74 y.o. male who presents for Medicare Annual/Subsequent preventive examination.  Review of Systems     Cardiac Risk Factors include: advanced age (>45men, >24 women);hypertension;male gender;obesity (BMI >30kg/m2);smoking/ tobacco exposure     Objective:    Today's Vitals   10/27/20 1118  PainSc: 5    There is no height or weight on file to calculate BMI.  Advanced Directives 10/27/2020 05/15/2020 11/15/2019 10/25/2019 10/19/2018 04/23/2018 11/12/2017  Does Patient Have a Medical Advance Directive? Yes Yes Yes Yes Yes Yes Yes  Type of Advance Directive Out of facility DNR (pink MOST or yellow form) Out of facility DNR (pink MOST or yellow form) Out of facility DNR (pink MOST or yellow form) Out of facility DNR (pink MOST or yellow form) Out of facility DNR (pink MOST or yellow form) Healthcare Power of [place];Living will;Out of facility DNR (pink MOST or yellow form) Out of facility DNR (pink MOST or yellow form)  Does patient want to make changes to medical advance directive? No - Patient declined No - Patient declined No - Patient declined No - Patient declined No - Patient declined - -  Copy of Healthcare Power of Attorney in Chart? - - - - - No - copy requested -  Would patient like information on creating a medical advance directive? - - - - - - No - Patient declined  Pre-existing out of facility DNR order (yellow form or pink MOST form) - - Pink Most/Yellow Form available - Physician notified to receive inpatient order - Yellow form placed in chart (order not valid for inpatient use) Yellow form placed in chart (order not valid for inpatient use) Yellow form placed in chart (order not valid for inpatient use)    Current Medications (verified) Outpatient Encounter Medications as of 10/27/2020  Medication Sig  . allopurinol (ZYLOPRIM) 100 MG tablet Take 100 mg by mouth as needed.  Marland Kitchen aspirin 81 MG tablet Take 81 mg by mouth daily.  Marland Kitchen  atorvastatin (LIPITOR) 40 MG tablet TAKE 1 TABLET BY MOUTH AT BEDTIME. GENERIC EQUIVALENT FOR LIPITOR  . ibuprofen (ADVIL) 600 MG tablet Take 1 tablet (600 mg total) by mouth daily as needed (severe pain).  Marland Kitchen lisinopril-hydrochlorothiazide (ZESTORETIC) 20-25 MG tablet Take 1 tablet by mouth daily.  Marland Kitchen omega-3 acid ethyl esters (LOVAZA) 1 g capsule TAKE 1 CAPSULE BY MOUTH THREE TIMES DAILY   No facility-administered encounter medications on file as of 10/27/2020.    Allergies (verified) Patient has no known allergies.   History: Past Medical History:  Diagnosis Date  . Atrial septal defect    corrected in 1962  . Cataracts, bilateral    "early stages"  . Hypercholesteremia   . Hypertension    Sees Dr. Dorris Fetch  . Impaired hearing   . Impaired vision    glasses  . Low back pain   . Osteoarthritis of both knees    left knee  . Rheumatic fever    at 74 years old  . Shoulder pain    left, sees chiropracter for   . Stroke (HCC) 03/14/2008   pt states that he may have had a mild stroke 1 week after knee surgery   Past Surgical History:  Procedure Laterality Date  . APPENDECTOMY  1979  . ASD REPAIR    . ASD REPAIR    . CATARACT EXTRACTION Left    May 2017  . CATARACT EXTRACTION Right    May 2017  .  KNEE SURGERY     4 surgeries on right knee  . plantar fascititis     bilateral surgery   . TONSILLECTOMY  1954   and removal of adnoids  . TOTAL KNEE ARTHROPLASTY  2009   right knee, Dr.Randal   . TOTAL KNEE ARTHROPLASTY  01/15/2013   Procedure: TOTAL KNEE ARTHROPLASTY;  Surgeon: Thera Flake., MD;  Location: [facility] OR;  Service: Orthopedics;  Laterality: Left;  left total knee arthroplasty   Family History  Adopted: Yes  Problem Relation Age of Onset  . Heart attack Father   . Pancreatic cancer Brother    Social History   Socioeconomic History  . Marital status: Divorced    Spouse name: Not on file  . Number of children: Not on file  . Years of education: Not on  file  . Highest education level: Not on file  Occupational History  . Not on file  Tobacco Use  . Smoking status: Never Smoker  . Smokeless tobacco: Never Used  Vaping Use  . Vaping Use: Never used  Substance and Sexual Activity  . Alcohol use: Yes    Alcohol/week: 2.0 standard drinks    Types: 2 Standard drinks or equivalent per week    Comment: occasional use  . Drug use: No  . Sexual activity: Not Currently  Other Topics Concern  . Not on file  Social History Narrative  . Not on file   Social Determinants of Health   Financial Resource Strain:   . Difficulty of Paying Living Expenses: Not on file  Food Insecurity:   . Worried About Programme researcher, broadcasting/film/video in the Last Year: Not on file  . Ran Out of Food in the Last Year: Not on file  Transportation Needs:   . Lack of Transportation (Medical): Not on file  . Lack of Transportation (Non-Medical): Not on file  Physical Activity:   . Days of Exercise per Week: Not on file  . Minutes of Exercise per Session: Not on file  Stress:   . Feeling of Stress : Not on file  Social Connections:   . Frequency of Communication with Friends and Family: Not on file  . Frequency of Social Gatherings with Friends and Family: Not on file  . Attends Religious Services: Not on file  . Active Member of Clubs or Organizations: Not on file  . Attends Banker Meetings: Not on file  . Marital Status: Not on file    Tobacco Counseling Counseling given: Not Answered   Clinical Intake:  Pre-visit preparation completed: No  Pain : 0-10 Pain Score: 5  (sometimes 7) Pain Type: Chronic pain Pain Location: Hand Pain Orientation: Left (thumb) Pain Radiating Towards: no Pain Descriptors / Indicators: Dull, Aching Pain Onset:  (several years early in his 30's) Pain Frequency: Intermittent Pain Relieving Factors: Ibuprofen Effect of Pain on Daily Activities: inconvience and annoying  Pain Relieving Factors: Ibuprofen  BMI -  recorded: 36.14 Nutritional Status: BMI > 30  Obese Nutritional Risks: None Diabetes: No  How often do you need to have someone help you when you read instructions, pamphlets, or other written materials from your doctor or pharmacy?: 1 - Never What is the last grade level you completed in school?: [employer] Graduate  Diabetic?No  Interpreter Needed?: No  Information entered by :: Catrell Morrone FNP-C   Activities of Daily Living In your present state of health, do you have any difficulty performing the following activities: 10/27/2020  Hearing? N  Vision? N  Difficulty concentrating or making decisions? Y  Comment forgetful  Walking or climbing stairs? N  Dressing or bathing? N  Doing errands, shopping? N  Preparing Food and eating ? N  Using the Toilet? N  In the past six months, have you accidently leaked urine? Y  Comment frequately  Do you have problems with loss of bowel control? N  Managing your Medications? N  Managing your Finances? N  Housekeeping or managing your Housekeeping? N  Some recent data might be hidden    Patient Care Team: Kermit Balo, DO as PCP - General (Geriatric Medicine)  Indicate any recent Medical Services you may have received from other than [facility] providers in the past year (date may be approximate).     Assessment:   This is a routine wellness examination for Dean Fry.  Hearing/Vision screen  Hearing Screening             Right ear:           Left ear:           Comments: Some Hearing Concerns. Patient wears Hearing AID.  Vision Screening Comments: No Vision Concerns. Last Eye Exam 2021. Patient wears prescription glasses.  Dietary issues and exercise activities discussed: Current Exercise Habits: Home exercise routine, Type of exercise: stretching;Other - see comments (uses stairs,[place] around to [place] on the weekend), Time (Minutes): 15, Frequency (Times/Week): 3, Weekly Exercise  (Minutes/Week): 45, Intensity: Moderate, Exercise limited by: None identified  Goals    . Exercise 5x per week (30 min per time)     Cardiovascular exercise    . Reduce fat intake    . Weight < 200 lb (90.719 kg)     Goal for next visit 250 lbs 06/08/16      Depression Screen PHQ 2/9 Scores 10/27/2020 01/26/2020 11/15/2019 10/25/2019 07/26/2019 10/19/2018 04/23/2018  PHQ - 2 Score 0 0 0 0 0 0 0    Fall Risk Fall Risk  10/27/2020 05/15/2020 01/26/2020 11/15/2019 10/25/2019  Falls in the past year? 1 0 0 0 0  Number falls in past yr: 0 0 0 0 0  Injury with Fall? 0 0 0 - 0    Any stairs in or around the home? Yes  If so, are there any without handrails? Yes  Home free of loose throw rugs in walkways, pet beds, electrical cords, etc? No  Adequate lighting in your home to reduce risk of falls? Yes   ASSISTIVE DEVICES UTILIZED TO PREVENT FALLS:  Life alert? No  Use of a cane, walker or w/c? No  Grab bars in the bathroom? Yes  Shower chair or bench in shower? No  Elevated toilet seat or a handicapped toilet? No   TIMED UP AND GO:  Was the test performed? No .  Length of time to ambulate 10 feet: N/a  sec.   Gait steady and fast without use of assistive device  Cognitive Function: MMSE - Mini Mental State Exam 10/25/2019 04/23/2018 03/21/2017 09/08/2015  Orientation to time Orientation to Place Registration Attention/ Calculation Recall Language- name 2 objects Language- repeat Language- follow 3 step command Language- read & follow direction Write a sentence Copy design 1 1  1 1  Total score 30 30 28 27      6CIT Screen 10/27/2020  What Year? 0 points  What month? 0 points  What time? 0 points  Count back from 20 0 points  Months in reverse 0 points  Repeat phrase 0 points  Total Score 0    Immunizations Immunization History  Administered Date(s) Administered  . Fluad  Quad(high Dose 65+) 09/24/2019  . Influenza Whole 08/29/2011, 12/03/2012  . Influenza, High Dose Seasonal PF 09/29/2017, 10/26/2018  . Influenza,inj,Quad PF,6+ Mos 12/09/2013, 09/02/2014, 09/08/2015, 11/25/2016  . Moderna SARS-COVID-2 Vaccination 03/06/2020, 04/07/2020  . Pneumococcal Conjugate-13 01/06/2015  . Pneumococcal Polysaccharide-23 02/27/2012  . Tdap 02/27/2012  . Zoster 05/28/2012    TDAP status: Up to date Flu Vaccine status: Up to date- scheduled for injection 11/13/2020  Pneumococcal vaccine status: Up to date Covid-19 vaccine status: Completed vaccines  Qualifies for Shingles Vaccine? Yes   Zostavax completed No   Shingrix Completed?: No.    Education has been provided regarding the importance of this vaccine. Patient has been advised to call insurance company to determine out of pocket expense if they have not yet received this vaccine. Advised may also receive vaccine at local pharmacy or Health Dept. Verbalized acceptance and understanding.  Screening Tests Health Maintenance  Topic Date Due  . INFLUENZA VACCINE  07/30/2020  . COLONOSCOPY  03/18/2021  . TETANUS/TDAP  02/26/2022  . COVID-19 Vaccine  Completed  . Hepatitis C Screening  Completed  . PNA vac Low Risk Adult  Completed    Health Maintenance  Health Maintenance Due  Topic Date Due  . INFLUENZA VACCINE  07/30/2020    Colorectal cancer screening: Completed 03/19/2011 . Repeat every 10 years  Lung Cancer Screening: (Low Dose CT Chest recommended if Age 4-80 years, 30 pack-year currently smoking OR have quit w/in 15years.) does not qualify.   Lung Cancer Screening Referral: No  Additional Screening:  Hepatitis C Screening: does not qualify; Completed Yes  Vision Screening: Recommended annual ophthalmology exams for early detection of glaucoma and other disorders of the eye. Is the patient up to date with their annual eye exam?  Yes  Who is the provider or what is the name of the office in  which the patient attends annual eye exams? [facility]  If pt is not established with a provider, would they like to be referred to a provider to establish care? No .   Dental Screening: Recommended annual dental exams for proper oral hygiene  Community Resource Referral / Chronic Care Management: CRR required this visit?  No   CCM required this visit?  No      Plan:    I have personally reviewed and noted the following in the patient's chart:   . Medical and social history . Use of alcohol, tobacco or illicit drugs  . Current medications and supplements . Functional ability and status . Nutritional status . Physical activity . Advanced directives . List of other physicians . Hospitalizations, surgeries, and ER visits in previous 12 months . Vitals . Screenings to include cognitive, depression, and falls . Referrals and appointments  In addition, I have reviewed and discussed with patient certain preventive protocols, quality metrics, and best practice recommendations. A written personalized care plan for preventive services as well as general preventive health recommendations were provided to patient.    Caesar Bookmaninah C Loletha Bertini, NP   10/27/2020   Nurse Notes: Due for Influenza vaccine will schedule in office appointment 11/13/2020 has lab  work scheduled

## 2020-11-13 ENCOUNTER — Other Ambulatory Visit: Payer: Medicare Other | Admitting: Internal Medicine

## 2020-11-13 ENCOUNTER — Ambulatory Visit: Payer: Medicare Other

## 2020-11-15 ENCOUNTER — Other Ambulatory Visit: Payer: Self-pay

## 2020-11-15 ENCOUNTER — Other Ambulatory Visit: Payer: Medicare Other

## 2020-11-15 ENCOUNTER — Ambulatory Visit: Payer: Medicare Other

## 2020-11-15 DIAGNOSIS — I10 Essential (primary) hypertension: Secondary | ICD-10-CM | POA: Diagnosis not present

## 2020-11-15 DIAGNOSIS — R739 Hyperglycemia, unspecified: Secondary | ICD-10-CM

## 2020-11-15 DIAGNOSIS — M5136 Other intervertebral disc degeneration, lumbar region: Secondary | ICD-10-CM | POA: Diagnosis not present

## 2020-11-15 DIAGNOSIS — M9903 Segmental and somatic dysfunction of lumbar region: Secondary | ICD-10-CM | POA: Diagnosis not present

## 2020-11-15 DIAGNOSIS — E7800 Pure hypercholesterolemia, unspecified: Secondary | ICD-10-CM

## 2020-11-15 DIAGNOSIS — M9905 Segmental and somatic dysfunction of pelvic region: Secondary | ICD-10-CM | POA: Diagnosis not present

## 2020-11-15 DIAGNOSIS — M9904 Segmental and somatic dysfunction of sacral region: Secondary | ICD-10-CM | POA: Diagnosis not present

## 2020-11-16 ENCOUNTER — Other Ambulatory Visit: Payer: Self-pay | Admitting: Internal Medicine

## 2020-11-16 LAB — COMPLETE METABOLIC PANEL WITH GFR
AG Ratio: 1.6 (calc) (ref 1.0–2.5)
ALT: 22 U/L (ref 9–46)
AST: 24 U/L (ref 10–35)
Albumin: 4.4 g/dL (ref 3.6–5.1)
Alkaline phosphatase (APISO): 64 U/L (ref 35–144)
BUN: 23 mg/dL (ref 7–25)
CO2: 30 mmol/L (ref 20–32)
Calcium: 9.1 mg/dL (ref 8.6–10.3)
Chloride: 103 mmol/L (ref 98–110)
Creat: 0.98 mg/dL (ref 0.70–1.18)
GFR, Est African American: 88 mL/min/{1.73_m2} (ref >=60)
GFR, Est Non African American: 76 mL/min/{1.73_m2} (ref >=60)
Globulin: 2.7 g/dL (calc) (ref 1.9–3.7)
Glucose, Bld: 93 mg/dL (ref 65–99)
Potassium: 4.2 mmol/L (ref 3.5–5.3)
Sodium: 140 mmol/L (ref 135–146)
Total Bilirubin: 0.7 mg/dL (ref 0.2–1.2)
Total Protein: 7.1 g/dL (ref 6.1–8.1)

## 2020-11-16 LAB — CBC WITH DIFFERENTIAL/PLATELET
Absolute Monocytes: 451 cells/uL (ref 200–950)
Basophils Absolute: 31 cells/uL (ref 0–200)
Basophils Relative: 0.5 %
Eosinophils Absolute: 128 cells/uL (ref 15–500)
Eosinophils Relative: 2.1 %
HCT: 41.5 % (ref 38.5–50.0)
Hemoglobin: 14 g/dL (ref 13.2–17.1)
Lymphs Abs: 1952 cells/uL (ref 850–3900)
MCH: 29.5 pg (ref 27.0–33.0)
MCHC: 33.7 g/dL (ref 32.0–36.0)
MCV: 87.6 fL (ref 80.0–100.0)
MPV: 11.2 fL (ref 7.5–12.5)
Monocytes Relative: 7.4 %
Neutro Abs: 3538 cells/uL (ref 1500–7800)
Neutrophils Relative %: 58 %
Platelets: 207 10*3/uL (ref 140–400)
RBC: 4.74 10*6/uL (ref 4.20–5.80)
RDW: 13.4 % (ref 11.0–15.0)
Total Lymphocyte: 32 %
WBC: 6.1 10*3/uL (ref 3.8–10.8)

## 2020-11-16 LAB — LIPID PANEL
Cholesterol: 116 mg/dL (ref <200)
HDL: 34 mg/dL — ABNORMAL LOW (ref >=40)
LDL Cholesterol (Calc): 67 mg/dL (calc)
Non-HDL Cholesterol (Calc): 82 mg/dL (calc) (ref <130)
Total CHOL/HDL Ratio: 3.4 (calc) (ref <5.0)
Triglycerides: 73 mg/dL (ref <150)

## 2020-11-16 LAB — HEMOGLOBIN A1C
Hgb A1c MFr Bld: 5.6 % of total Hgb (ref <5.7)
Mean Plasma Glucose: 114 (calc)
eAG (mmol/L): 6.3 (calc)

## 2020-11-16 NOTE — Progress Notes (Signed)
Sugar average is stable at 5.6 (top of normal range) Bad cholesterol is at goal of less than 70. Blood counts, electrolytes, liver and kidneys are all normal.

## 2020-11-20 ENCOUNTER — Other Ambulatory Visit: Payer: Self-pay

## 2020-11-20 ENCOUNTER — Encounter: Payer: Self-pay | Admitting: Internal Medicine

## 2020-11-20 ENCOUNTER — Ambulatory Visit (INDEPENDENT_AMBULATORY_CARE_PROVIDER_SITE_OTHER): Payer: Medicare Other | Admitting: Internal Medicine

## 2020-11-20 VITALS — BP 118/78 | HR 56 | Temp 97.5°F | Ht 71.0 in | Wt 256.6 lb

## 2020-11-20 DIAGNOSIS — R739 Hyperglycemia, unspecified: Secondary | ICD-10-CM

## 2020-11-20 DIAGNOSIS — L570 Actinic keratosis: Secondary | ICD-10-CM

## 2020-11-20 DIAGNOSIS — I10 Essential (primary) hypertension: Secondary | ICD-10-CM | POA: Diagnosis not present

## 2020-11-20 DIAGNOSIS — Z6835 Body mass index (BMI) 35.0-35.9, adult: Secondary | ICD-10-CM

## 2020-11-20 DIAGNOSIS — M9905 Segmental and somatic dysfunction of pelvic region: Secondary | ICD-10-CM | POA: Diagnosis not present

## 2020-11-20 DIAGNOSIS — M8949 Other hypertrophic osteoarthropathy, multiple sites: Secondary | ICD-10-CM

## 2020-11-20 DIAGNOSIS — Z23 Encounter for immunization: Secondary | ICD-10-CM

## 2020-11-20 DIAGNOSIS — M9903 Segmental and somatic dysfunction of lumbar region: Secondary | ICD-10-CM | POA: Diagnosis not present

## 2020-11-20 DIAGNOSIS — M79672 Pain in left foot: Secondary | ICD-10-CM

## 2020-11-20 DIAGNOSIS — Z0000 Encounter for general adult medical examination without abnormal findings: Secondary | ICD-10-CM

## 2020-11-20 DIAGNOSIS — E669 Obesity, unspecified: Secondary | ICD-10-CM

## 2020-11-20 DIAGNOSIS — M5136 Other intervertebral disc degeneration, lumbar region: Secondary | ICD-10-CM | POA: Diagnosis not present

## 2020-11-20 DIAGNOSIS — M159 Polyosteoarthritis, unspecified: Secondary | ICD-10-CM

## 2020-11-20 DIAGNOSIS — M9904 Segmental and somatic dysfunction of sacral region: Secondary | ICD-10-CM | POA: Diagnosis not present

## 2020-11-20 DIAGNOSIS — E7800 Pure hypercholesterolemia, unspecified: Secondary | ICD-10-CM

## 2020-11-20 NOTE — Progress Notes (Signed)
Provider:  Gwenith Spitz. Renato Gails, D.O., C.M.D. Location:    [facility]   Place of Service:   clinic  Previous PCP: Kermit Balo, DO Patient Care Team: Kermit Balo, DO as PCP - General (Geriatric Medicine)  Extended Emergency Contact Information Primary Emergency Contact: Currie [place] Home Phone: [phone] Relation: Other Secondary Emergency Contact: Davis,Scott  [place] Home Phone: [phone] Relation: Other  Code Status: DNR Goals of Care: Advanced Directive information Advanced Directives 10/27/2020  Does Patient Have a Medical Advance Directive? Yes  Type of Advance Directive Out of facility DNR (pink MOST or yellow form)  Does patient want to make changes to medical advance directive? No - Patient declined  Copy of Healthcare Power of Attorney in Chart? -  Would patient like information on creating a medical advance directive? -  Pre-existing out of facility DNR order (yellow form or pink MOST form) -   Chief Complaint  Patient presents with  . Annual Exam    Annual  exam     HPI: Patient is a 74 y.o. male seen today for an annual physical exam.    He requested and received his high dose flu shot today.  He's had his covid booster.  Talks about his best friend's decline with Lewy Body Dementia.  They travel to [place] and golf, etc.  He's gained 8 lbs in 6 wks.  Not riding as much and had big finish with volleyball.  B/w weather and lack of discipline.    He has a few places on side of forehead that are changing--keratoses that are changing.    Uses high doses of ibuprofen when he's golfing.  Past Medical History:  Diagnosis Date  . Atrial septal defect    corrected in 1962  . Cataracts, bilateral    "early stages"  . Hypercholesteremia   . Hypertension    Sees Dr. Dorris Fetch  . Impaired hearing   . Impaired vision    glasses  . Low back pain   . Osteoarthritis of both knees    left knee  . Rheumatic  fever    at 74 years old  . Shoulder pain    left, sees chiropracter for   . Stroke (HCC) 03/14/2008   pt states that he may have had a mild stroke 1 week after knee surgery   Past Surgical History:  Procedure Laterality Date  . APPENDECTOMY  1979  . ASD REPAIR    . ASD REPAIR    . CATARACT EXTRACTION Left    May 2017  . CATARACT EXTRACTION Right    May 2017  . KNEE SURGERY     4 surgeries on right knee  . plantar fascititis     bilateral surgery   . TONSILLECTOMY  1954   and removal of adnoids  . TOTAL KNEE ARTHROPLASTY  2009   right knee, Dr.Randal   . TOTAL KNEE ARTHROPLASTY  01/15/2013   Procedure: TOTAL KNEE ARTHROPLASTY;  Surgeon: Thera Flake., MD;  Location: [facility] OR;  Service: Orthopedics;  Laterality: Left;  left total knee arthroplasty    reports that he has never smoked. He has never used smokeless tobacco. He reports current alcohol use of about 2.0 standard drinks of alcohol per week. He reports that he does not use drugs.  Functional Status Survey: independent    Family History  Adopted: Yes  Problem Relation Age of Onset  . Heart attack Father   .  Pancreatic cancer Brother     Health Maintenance  Topic Date Due  . COLONOSCOPY  03/18/2021  . TETANUS/TDAP  02/26/2022  . INFLUENZA VACCINE  Completed  . COVID-19 Vaccine  Completed  . Hepatitis C Screening  Completed  . PNA vac Low Risk Adult  Completed    No Known Allergies  Outpatient Encounter Medications as of 11/20/2020  Medication Sig  . allopurinol (ZYLOPRIM) 100 MG tablet Take 100 mg by mouth as needed.  Marland Kitchen aspirin 81 MG tablet Take 81 mg by mouth daily.  Marland Kitchen atorvastatin (LIPITOR) 40 MG tablet TAKE 1 TABLET BY MOUTH AT BEDTIME. GENERIC EQUIVALENT FOR LIPITOR  . ibuprofen (ADVIL) 600 MG tablet Take 1 tablet (600 mg total) by mouth daily as needed (severe pain).  Marland Kitchen lisinopril-hydrochlorothiazide (ZESTORETIC) 20-25 MG tablet TAKE 1 TABLET BY MOUTH EVERY DAY. GENERIC EQUIVALENT FOR ZESTORETIC  .  omega-3 acid ethyl esters (LOVAZA) 1 g capsule TAKE 1 CAPSULE BY MOUTH THREE TIMES DAILY   No facility-administered encounter medications on file as of 11/20/2020.    Review of Systems  Constitutional: Negative for chills, fever and malaise/fatigue.  HENT: Negative for congestion and sore throat.   Eyes: Negative for blurred vision.       Had laser procedure on lens implant and new lenses and can see well now  Respiratory: Negative for cough and shortness of breath.   Cardiovascular: Negative for chest pain, palpitations and leg swelling.  Gastrointestinal: Positive for blood in stool. Negative for abdominal pain, constipation, diarrhea and melena.       Has had hemorrhoids  Genitourinary: Negative for dysuria.  Musculoskeletal: Positive for joint pain.       Left foot pain (first metatarsal area) first thing in am  Neurological: Negative for dizziness and loss of consciousness.  Endo/Heme/Allergies: Bruises/bleeds easily.  Psychiatric/Behavioral: Negative for depression and memory loss. The patient is not nervous/anxious and does not have insomnia.     Vitals:   11/20/20 0832  BP: 118/78  Pulse: (!) 56  Temp: (!) 97.5 F (36.4 C)  TempSrc: Temporal  SpO2: 96%  Weight: 256 lb 9.6 oz (116.4 kg)  Height: 5\' 11"  (1.803 m)   Body mass index is 35.79 kg/m. Physical Exam Vitals reviewed.  Constitutional:      General: He is not in acute distress.    Appearance: Normal appearance. He is obese. He is not toxic-appearing.  HENT:     Head: Normocephalic and atraumatic.     Right Ear: Tympanic membrane, ear canal and external ear normal.     Left Ear: Tympanic membrane, ear canal and external ear normal.     Ears:     Comments: Right hearing aid    Nose: Nose normal.     Mouth/Throat:     Pharynx: Oropharynx is clear.  Eyes:     Extraocular Movements: Extraocular movements intact.     Conjunctiva/sclera: Conjunctivae normal.     Pupils: Pupils are equal, round, and reactive  to light.  Cardiovascular:     Rate and Rhythm: Normal rate and regular rhythm.     Heart sounds: Murmur heard.   Pulmonary:     Effort: Pulmonary effort is normal.     Breath sounds: Normal breath sounds. No wheezing, rhonchi or rales.  Abdominal:     General: Bowel sounds are normal. There is no distension.     Palpations: Abdomen is soft.     Tenderness: There is no abdominal tenderness. There is no guarding or  rebound.  Musculoskeletal:        General: Normal range of motion.     Cervical back: Neck supple.     Right lower leg: No edema.     Left lower leg: No edema.  Lymphadenopathy:     Cervical: No cervical adenopathy.  Skin:    General: Skin is warm and dry.     Capillary Refill: Capillary refill takes less than 2 seconds.     Comments: Two keratoses right temple  Neurological:     General: No focal deficit present.     Mental Status: He is alert and oriented to person, place, and time. Mental status is at baseline.     Cranial Nerves: No cranial nerve deficit.     Sensory: No sensory deficit.     Motor: No weakness.     Coordination: Coordination normal.     Gait: Gait normal.     Deep Tendon Reflexes: Reflexes normal.  Psychiatric:        Mood and Affect: Mood normal.        Behavior: Behavior normal.        Thought Content: Thought content normal.        Judgment: Judgment normal.     Labs reviewed: Basic Metabolic Panel: Recent Labs    11/15/20 0834  NA 140  K 4.2  CL 103  CO2 30  GLUCOSE 93  BUN 23  CREATININE 0.98  CALCIUM 9.1   Liver Function Tests: Recent Labs    11/15/20 0834  AST 24  ALT 22  BILITOT 0.7  PROT 7.1   No results for input(s): LIPASE, AMYLASE in the last 8760 hours. No results for input(s): AMMONIA in the last 8760 hours. CBC: Recent Labs    11/15/20 0834  WBC 6.1  NEUTROABS 3,538  HGB 14.0  HCT 41.5  MCV 87.6  PLT 207   Cardiac Enzymes: No results for input(s): CKTOTAL, CKMB, CKMBINDEX, TROPONINI in the last  8760 hours. BNP: Invalid input(s): POCBNP Lab Results  Component Value Date   HGBA1C 5.6 11/15/2020    Lab Results  Component Value Date   VITAMINB12 489 04/01/2008   Assessment/Plan 1. Annual physical exam -performed today -will be due for cscope next time--he wants to do cologuard   2. Need for influenza vaccination - Flu Vaccine QUAD High Dose(Fluad)  3. Primary osteoarthritis involving multiple joints -stable, cont regular exercise, rare ibuprofen during golf game  4. Essential hypertension, benign -bp at goal with current regimen, cont same and monitor  5. Pure hypercholesterolemia - at goal, cont same regimen as is -CBC with Differential/Platelet; Future - COMPLETE METABOLIC PANEL WITH GFR; Future - Lipid panel; Future  6. Hyperglycemia - cont to walk and cycle, stretch band exercises, be careful with starches and sweets - Hemoglobin A1c; Future  7. BMI 35.0-35.9,adult -counseled on diet and exercise  8. Obesity (BMI 35.0-39.9 without comorbidity) -cont modified diet and exercise regimen  9. Actinic keratoses - Ambulatory referral to Dermatology  10. Left foot pain -he's going to let me know if it persists  Labs/tests ordered:   Orders Placed This Encounter  Procedures  . Flu Vaccine QUAD High Dose(Fluad)  . CBC with Differential/Platelet    Standing Status:   Future    Standing Expiration Date:   08/20/2021  . COMPLETE METABOLIC PANEL WITH GFR    Standing Status:   Future    Standing Expiration Date:   08/20/2021  . Hemoglobin A1c    Standing  Status:   Future    Standing Expiration Date:   08/20/2021    Order Specific Question:   Release to patient    Answer:   Immediate  . Lipid panel    Standing Status:   Future    Standing Expiration Date:   08/20/2021  . Ambulatory referral to Dermatology    Referral Priority:   Routine    Referral Type:   Consultation    Referral Reason:   Specialty Services Required    Requested Specialty:   Dermatology      Number of Visits Requested:   1   F/u in 6 mos with fasting labs before as above  Korie Streat L. Icey Tello, D.O. Geriatrics [facility] [address] [place] [address] Cell Phone (Mon-Fri 8am-5pm):  [phone] On Call:  [phone] & follow prompts after 5pm & weekends Office Phone:  [phone] Office Fax:  [phone]

## 2020-11-29 ENCOUNTER — Telehealth: Payer: Self-pay | Admitting: Dermatology

## 2020-11-29 NOTE — Telephone Encounter (Signed)
Patient is calling for a referral appointment from [facility].  Patient is scheduled for 05/02/2020 at 8:00 with Janalyn Harder, MD.

## 2021-02-19 ENCOUNTER — Encounter: Payer: Self-pay | Admitting: Internal Medicine

## 2021-04-09 ENCOUNTER — Telehealth: Payer: Self-pay

## 2021-04-09 DIAGNOSIS — M9901 Segmental and somatic dysfunction of cervical region: Secondary | ICD-10-CM | POA: Diagnosis not present

## 2021-04-09 DIAGNOSIS — M50321 Other cervical disc degeneration at C4-C5 level: Secondary | ICD-10-CM | POA: Diagnosis not present

## 2021-04-09 DIAGNOSIS — M9903 Segmental and somatic dysfunction of lumbar region: Secondary | ICD-10-CM | POA: Diagnosis not present

## 2021-04-09 DIAGNOSIS — M5136 Other intervertebral disc degeneration, lumbar region: Secondary | ICD-10-CM | POA: Diagnosis not present

## 2021-04-09 NOTE — Telephone Encounter (Signed)
Patient called to ask if his appointment time had changed I told him it hadn't and he wanted to know where Dr. Renato Gails had gone I told him she was no longer at this practice and was now working with [facility] he wanted to know if she was still in [place] I told him I didn't have that information he said he would be asking this question at his up coming appintment

## 2021-05-02 ENCOUNTER — Other Ambulatory Visit: Payer: Self-pay

## 2021-05-02 ENCOUNTER — Encounter: Payer: Self-pay | Admitting: Dermatology

## 2021-05-02 ENCOUNTER — Ambulatory Visit: Payer: Medicare Other | Admitting: Dermatology

## 2021-05-02 DIAGNOSIS — L821 Other seborrheic keratosis: Secondary | ICD-10-CM | POA: Diagnosis not present

## 2021-05-02 DIAGNOSIS — Z1283 Encounter for screening for malignant neoplasm of skin: Secondary | ICD-10-CM

## 2021-05-13 ENCOUNTER — Encounter: Payer: Self-pay | Admitting: Dermatology

## 2021-05-13 NOTE — Progress Notes (Signed)
   New Patient   Subjective  Dean Fry is a 75 y.o. male who presents for the following: New Patient (Initial Visit) (Patient here today to have a lesion checked on his right temple x years per patient it's larger, no bleeding. Patient would also like his arms and hands checked, patient believes he has warts popping up on both arm and hands and he wants to know what to do about it. No personal history of atypical moles, melanoma or non mole skin cancer.  Per patient his father did have skin cancer but he's not sure which type.).  Annual skin examination Location:  Duration:  Quality: possible enlargement of the spot on right forehead Associated Signs/Symptoms: Modifying Factors:  Severity:  Timing: Context:    The following portions of the chart were reviewed this encounter and updated as appropriate:  Tobacco  Allergies  Meds  Problems  Med Hx  Surg Hx  Fam Hx      Objective  Well appearing patient in no apparent distress; mood and affect are within normal limits. Objective  Chest - [facility]): General skin examination head to feet.  No atypical moles or nonmelanoma skin cancer.  79mm brown textured flattopped papule right forehead represents a seborrheic keratosis (dermoscopy typical).  Similar lesions on the arms that are flesh-colored could be the same or warts.  None of these are currently bothersome to Dean Fry.    A full examination was performed including scalp, head, eyes, ears, nose, lips, neck, chest, axillae, abdomen, back, buttocks, bilateral upper extremities, bilateral lower extremities, hands, feet, fingers, toes, fingernails, and toenails. All findings within normal limits unless otherwise noted below.   Assessment & Plan  Encounter for screening for malignant neoplasm of skin Chest - [facility])  No lesions currently require removal.  Urged to self examine twice annually plus optional dermatologist examination once yearly.

## 2021-05-15 ENCOUNTER — Other Ambulatory Visit: Payer: Self-pay

## 2021-05-15 DIAGNOSIS — I10 Essential (primary) hypertension: Secondary | ICD-10-CM

## 2021-05-15 DIAGNOSIS — R739 Hyperglycemia, unspecified: Secondary | ICD-10-CM

## 2021-05-15 DIAGNOSIS — E7800 Pure hypercholesterolemia, unspecified: Secondary | ICD-10-CM

## 2021-05-15 DIAGNOSIS — E785 Hyperlipidemia, unspecified: Secondary | ICD-10-CM

## 2021-05-16 ENCOUNTER — Other Ambulatory Visit: Payer: Self-pay

## 2021-05-16 ENCOUNTER — Other Ambulatory Visit: Payer: Medicare Other

## 2021-05-16 DIAGNOSIS — R739 Hyperglycemia, unspecified: Secondary | ICD-10-CM

## 2021-05-16 DIAGNOSIS — I10 Essential (primary) hypertension: Secondary | ICD-10-CM

## 2021-05-16 DIAGNOSIS — E7800 Pure hypercholesterolemia, unspecified: Secondary | ICD-10-CM | POA: Diagnosis not present

## 2021-05-16 DIAGNOSIS — M9903 Segmental and somatic dysfunction of lumbar region: Secondary | ICD-10-CM | POA: Diagnosis not present

## 2021-05-16 DIAGNOSIS — M9901 Segmental and somatic dysfunction of cervical region: Secondary | ICD-10-CM | POA: Diagnosis not present

## 2021-05-16 DIAGNOSIS — E785 Hyperlipidemia, unspecified: Secondary | ICD-10-CM

## 2021-05-16 DIAGNOSIS — M50321 Other cervical disc degeneration at C4-C5 level: Secondary | ICD-10-CM | POA: Diagnosis not present

## 2021-05-16 DIAGNOSIS — M5136 Other intervertebral disc degeneration, lumbar region: Secondary | ICD-10-CM | POA: Diagnosis not present

## 2021-05-17 LAB — HEMOGLOBIN A1C
Hgb A1c MFr Bld: 5.5 % of total Hgb (ref <5.7)
Mean Plasma Glucose: 111 mg/dL
eAG (mmol/L): 6.2 mmol/L

## 2021-05-17 LAB — COMPLETE METABOLIC PANEL WITH GFR
AG Ratio: 1.7 (calc) (ref 1.0–2.5)
ALT: 16 U/L (ref 9–46)
AST: 18 U/L (ref 10–35)
Albumin: 4.3 g/dL (ref 3.6–5.1)
Alkaline phosphatase (APISO): 62 U/L (ref 35–144)
BUN: 24 mg/dL (ref 7–25)
CO2: 27 mmol/L (ref 20–32)
Calcium: 9.1 mg/dL (ref 8.6–10.3)
Chloride: 101 mmol/L (ref 98–110)
Creat: 0.92 mg/dL (ref 0.70–1.18)
GFR, Est African American: 94 mL/min/{1.73_m2} (ref >=60)
GFR, Est Non African American: 81 mL/min/{1.73_m2} (ref >=60)
Globulin: 2.5 g/dL (calc) (ref 1.9–3.7)
Glucose, Bld: 89 mg/dL (ref 65–99)
Potassium: 3.6 mmol/L (ref 3.5–5.3)
Sodium: 139 mmol/L (ref 135–146)
Total Bilirubin: 0.8 mg/dL (ref 0.2–1.2)
Total Protein: 6.8 g/dL (ref 6.1–8.1)

## 2021-05-17 LAB — CBC WITH DIFFERENTIAL/PLATELET
Absolute Monocytes: 431 cells/uL (ref 200–950)
Basophils Absolute: 50 cells/uL (ref 0–200)
Basophils Relative: 0.9 %
Eosinophils Absolute: 112 cells/uL (ref 15–500)
Eosinophils Relative: 2 %
HCT: 43.4 % (ref 38.5–50.0)
Hemoglobin: 14.3 g/dL (ref 13.2–17.1)
Lymphs Abs: 1512 cells/uL (ref 850–3900)
MCH: 28.7 pg (ref 27.0–33.0)
MCHC: 32.9 g/dL (ref 32.0–36.0)
MCV: 87.1 fL (ref 80.0–100.0)
MPV: 10.9 fL (ref 7.5–12.5)
Monocytes Relative: 7.7 %
Neutro Abs: 3494 cells/uL (ref 1500–7800)
Neutrophils Relative %: 62.4 %
Platelets: 213 10*3/uL (ref 140–400)
RBC: 4.98 10*6/uL (ref 4.20–5.80)
RDW: 13.8 % (ref 11.0–15.0)
Total Lymphocyte: 27 %
WBC: 5.6 10*3/uL (ref 3.8–10.8)

## 2021-05-17 LAB — LIPID PANEL
Cholesterol: 115 mg/dL (ref <200)
HDL: 33 mg/dL — ABNORMAL LOW (ref >=40)
LDL Cholesterol (Calc): 66 mg/dL (calc)
Non-HDL Cholesterol (Calc): 82 mg/dL (calc) (ref <130)
Total CHOL/HDL Ratio: 3.5 (calc) (ref <5.0)
Triglycerides: 81 mg/dL (ref <150)

## 2021-05-21 ENCOUNTER — Ambulatory Visit: Payer: Medicare Other | Admitting: Internal Medicine

## 2021-05-23 ENCOUNTER — Ambulatory Visit: Payer: Medicare Other | Admitting: Family Medicine

## 2021-06-29 ENCOUNTER — Telehealth: Payer: Self-pay

## 2021-06-29 NOTE — Telephone Encounter (Signed)
Patient returned call regarding medication refill. Pharmacy had called regarding medication stating Dr. Bufford Spikes had sent over request for refill. He stated that he had recently seen Dr. Bufford Spikes at [facility]. I asked patient if he was going to continue seeing Dr. Renato Gails as hi PCP, he notified me that he will be staying with Dr. Renato Gails. I told patient the we will cancel his upcoming appointment that is scheduled in October and remove Dr. Jacalyn Lefevre as his PCP. Patient verbalized his understanding.

## 2021-07-18 DIAGNOSIS — M5136 Other intervertebral disc degeneration, lumbar region: Secondary | ICD-10-CM | POA: Diagnosis not present

## 2021-07-18 DIAGNOSIS — M9905 Segmental and somatic dysfunction of pelvic region: Secondary | ICD-10-CM | POA: Diagnosis not present

## 2021-07-18 DIAGNOSIS — M9904 Segmental and somatic dysfunction of sacral region: Secondary | ICD-10-CM | POA: Diagnosis not present

## 2021-07-18 DIAGNOSIS — M9903 Segmental and somatic dysfunction of lumbar region: Secondary | ICD-10-CM | POA: Diagnosis not present

## 2021-07-20 DIAGNOSIS — H52203 Unspecified astigmatism, bilateral: Secondary | ICD-10-CM | POA: Diagnosis not present

## 2021-08-14 ENCOUNTER — Encounter: Payer: Self-pay | Admitting: Gastroenterology

## 2021-09-24 DIAGNOSIS — M5136 Other intervertebral disc degeneration, lumbar region: Secondary | ICD-10-CM | POA: Diagnosis not present

## 2021-09-24 DIAGNOSIS — M9904 Segmental and somatic dysfunction of sacral region: Secondary | ICD-10-CM | POA: Diagnosis not present

## 2021-09-24 DIAGNOSIS — M9903 Segmental and somatic dysfunction of lumbar region: Secondary | ICD-10-CM | POA: Diagnosis not present

## 2021-09-24 DIAGNOSIS — M9905 Segmental and somatic dysfunction of pelvic region: Secondary | ICD-10-CM | POA: Diagnosis not present

## 2021-10-29 ENCOUNTER — Encounter: Payer: Medicare Other | Admitting: Family

## 2021-11-12 DIAGNOSIS — M9904 Segmental and somatic dysfunction of sacral region: Secondary | ICD-10-CM | POA: Diagnosis not present

## 2021-11-12 DIAGNOSIS — M9905 Segmental and somatic dysfunction of pelvic region: Secondary | ICD-10-CM | POA: Diagnosis not present

## 2021-11-12 DIAGNOSIS — M5136 Other intervertebral disc degeneration, lumbar region: Secondary | ICD-10-CM | POA: Diagnosis not present

## 2021-11-12 DIAGNOSIS — M9903 Segmental and somatic dysfunction of lumbar region: Secondary | ICD-10-CM | POA: Diagnosis not present

## 2021-11-15 DIAGNOSIS — E1165 Type 2 diabetes mellitus with hyperglycemia: Secondary | ICD-10-CM | POA: Diagnosis not present

## 2021-11-15 DIAGNOSIS — E782 Mixed hyperlipidemia: Secondary | ICD-10-CM | POA: Diagnosis not present

## 2021-11-15 DIAGNOSIS — I10 Essential (primary) hypertension: Secondary | ICD-10-CM | POA: Diagnosis not present

## 2021-11-27 DIAGNOSIS — M9903 Segmental and somatic dysfunction of lumbar region: Secondary | ICD-10-CM | POA: Diagnosis not present

## 2021-11-27 DIAGNOSIS — M5136 Other intervertebral disc degeneration, lumbar region: Secondary | ICD-10-CM | POA: Diagnosis not present

## 2021-11-27 DIAGNOSIS — M9904 Segmental and somatic dysfunction of sacral region: Secondary | ICD-10-CM | POA: Diagnosis not present

## 2021-11-27 DIAGNOSIS — M9905 Segmental and somatic dysfunction of pelvic region: Secondary | ICD-10-CM | POA: Diagnosis not present

## 2021-12-04 DIAGNOSIS — M9905 Segmental and somatic dysfunction of pelvic region: Secondary | ICD-10-CM | POA: Diagnosis not present

## 2021-12-04 DIAGNOSIS — M9903 Segmental and somatic dysfunction of lumbar region: Secondary | ICD-10-CM | POA: Diagnosis not present

## 2021-12-04 DIAGNOSIS — M9904 Segmental and somatic dysfunction of sacral region: Secondary | ICD-10-CM | POA: Diagnosis not present

## 2021-12-04 DIAGNOSIS — M5136 Other intervertebral disc degeneration, lumbar region: Secondary | ICD-10-CM | POA: Diagnosis not present

## 2021-12-13 DIAGNOSIS — M5136 Other intervertebral disc degeneration, lumbar region: Secondary | ICD-10-CM | POA: Diagnosis not present

## 2021-12-13 DIAGNOSIS — M9904 Segmental and somatic dysfunction of sacral region: Secondary | ICD-10-CM | POA: Diagnosis not present

## 2021-12-13 DIAGNOSIS — M9905 Segmental and somatic dysfunction of pelvic region: Secondary | ICD-10-CM | POA: Diagnosis not present

## 2021-12-13 DIAGNOSIS — M9903 Segmental and somatic dysfunction of lumbar region: Secondary | ICD-10-CM | POA: Diagnosis not present

## 2021-12-19 DIAGNOSIS — M9904 Segmental and somatic dysfunction of sacral region: Secondary | ICD-10-CM | POA: Diagnosis not present

## 2021-12-19 DIAGNOSIS — M9903 Segmental and somatic dysfunction of lumbar region: Secondary | ICD-10-CM | POA: Diagnosis not present

## 2021-12-19 DIAGNOSIS — M9905 Segmental and somatic dysfunction of pelvic region: Secondary | ICD-10-CM | POA: Diagnosis not present

## 2021-12-19 DIAGNOSIS — M5136 Other intervertebral disc degeneration, lumbar region: Secondary | ICD-10-CM | POA: Diagnosis not present

## 2021-12-25 DIAGNOSIS — M9903 Segmental and somatic dysfunction of lumbar region: Secondary | ICD-10-CM | POA: Diagnosis not present

## 2021-12-25 DIAGNOSIS — M9905 Segmental and somatic dysfunction of pelvic region: Secondary | ICD-10-CM | POA: Diagnosis not present

## 2021-12-25 DIAGNOSIS — M9904 Segmental and somatic dysfunction of sacral region: Secondary | ICD-10-CM | POA: Diagnosis not present

## 2021-12-25 DIAGNOSIS — M5136 Other intervertebral disc degeneration, lumbar region: Secondary | ICD-10-CM | POA: Diagnosis not present

## 2022-01-03 DIAGNOSIS — M5136 Other intervertebral disc degeneration, lumbar region: Secondary | ICD-10-CM | POA: Diagnosis not present

## 2022-01-03 DIAGNOSIS — M9903 Segmental and somatic dysfunction of lumbar region: Secondary | ICD-10-CM | POA: Diagnosis not present

## 2022-01-03 DIAGNOSIS — M9904 Segmental and somatic dysfunction of sacral region: Secondary | ICD-10-CM | POA: Diagnosis not present

## 2022-01-03 DIAGNOSIS — M9905 Segmental and somatic dysfunction of pelvic region: Secondary | ICD-10-CM | POA: Diagnosis not present

## 2022-01-08 DIAGNOSIS — R0602 Shortness of breath: Secondary | ICD-10-CM | POA: Diagnosis not present

## 2022-01-08 DIAGNOSIS — R2241 Localized swelling, mass and lump, right lower limb: Secondary | ICD-10-CM | POA: Diagnosis not present

## 2022-01-11 ENCOUNTER — Telehealth: Payer: Self-pay

## 2022-01-11 ENCOUNTER — Other Ambulatory Visit (HOSPITAL_BASED_OUTPATIENT_CLINIC_OR_DEPARTMENT_OTHER): Payer: Self-pay | Admitting: Internal Medicine

## 2022-01-11 DIAGNOSIS — R0602 Shortness of breath: Secondary | ICD-10-CM

## 2022-01-11 DIAGNOSIS — R7989 Other specified abnormal findings of blood chemistry: Secondary | ICD-10-CM

## 2022-01-11 NOTE — Telephone Encounter (Signed)
Phone call placed to [facility] to inquire about needed authorization. Received message that company is having technical difficulties and requested a return call later in the day.

## 2022-01-11 NOTE — Telephone Encounter (Signed)
At direction of Dr. Renato Gails, phone call placed to [facility] [place] to inquire about a patient having a CT angio today due to elevated D-dimer and new onset of shortness of breath.Per Dean Fry at [facility], able to complete test today with order and authorization from patient's insurance. Order faxed over.

## 2022-01-15 ENCOUNTER — Other Ambulatory Visit: Payer: Medicare Other

## 2022-01-16 ENCOUNTER — Other Ambulatory Visit: Payer: Self-pay

## 2022-01-22 ENCOUNTER — Other Ambulatory Visit: Payer: Self-pay

## 2022-01-22 ENCOUNTER — Ambulatory Visit (HOSPITAL_BASED_OUTPATIENT_CLINIC_OR_DEPARTMENT_OTHER)
Admission: RE | Admit: 2022-01-22 | Discharge: 2022-01-22 | Disposition: A | Discharge status: Home / Self Care | Payer: Medicare Other | Source: Ambulatory Visit | Attending: Internal Medicine | Admitting: Internal Medicine

## 2022-01-22 DIAGNOSIS — R0602 Shortness of breath: Secondary | ICD-10-CM | POA: Insufficient documentation

## 2022-01-22 DIAGNOSIS — R7989 Other specified abnormal findings of blood chemistry: Secondary | ICD-10-CM | POA: Insufficient documentation

## 2022-01-22 DIAGNOSIS — I2699 Other pulmonary embolism without acute cor pulmonale: Secondary | ICD-10-CM | POA: Diagnosis not present

## 2022-01-22 DIAGNOSIS — R5383 Other fatigue: Secondary | ICD-10-CM | POA: Diagnosis not present

## 2022-01-22 LAB — POCT I-STAT CREATININE: Creatinine, Ser: 1.3 mg/dL — ABNORMAL HIGH (ref 0.61–1.24)

## 2022-01-22 MED ORDER — IOHEXOL 350 MG/ML SOLN
85.0000 mL | Freq: Once | INTRAVENOUS | Status: AC | PRN
  Administered 2022-01-22: 10:00:00 85 mL via INTRAVENOUS

## 2022-01-30 DIAGNOSIS — M5136 Other intervertebral disc degeneration, lumbar region: Secondary | ICD-10-CM | POA: Diagnosis not present

## 2022-01-30 DIAGNOSIS — M9901 Segmental and somatic dysfunction of cervical region: Secondary | ICD-10-CM | POA: Diagnosis not present

## 2022-01-30 DIAGNOSIS — M9903 Segmental and somatic dysfunction of lumbar region: Secondary | ICD-10-CM | POA: Diagnosis not present

## 2022-01-30 DIAGNOSIS — M5031 Other cervical disc degeneration,  high cervical region: Secondary | ICD-10-CM | POA: Diagnosis not present

## 2022-04-30 ENCOUNTER — Telehealth: Payer: Self-pay | Admitting: Internal Medicine

## 2022-04-30 DIAGNOSIS — Z86718 Personal history of other venous thrombosis and embolism: Secondary | ICD-10-CM

## 2022-04-30 DIAGNOSIS — R2242 Localized swelling, mass and lump, left lower limb: Secondary | ICD-10-CM

## 2022-04-30 NOTE — Telephone Encounter (Signed)
Pt called reporting new LLE swelling after a 270 mile road trip.  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  Will obtain venous doppler of LLE now.   ?

## 2022-05-03 ENCOUNTER — Telehealth: Payer: Self-pay | Admitting: Internal Medicine

## 2022-05-03 DIAGNOSIS — I2609 Other pulmonary embolism with acute cor pulmonale: Secondary | ICD-10-CM

## 2022-05-03 DIAGNOSIS — R0609 Other forms of dyspnea: Secondary | ICD-10-CM

## 2022-05-03 NOTE — Telephone Encounter (Signed)
Foot swelling has improved after taking gout medication.  He's been faithful about his eliquis.  He has continued since his initial PE over the holidays to have symptoms of dyspnea and decreased energy.  He reports he's losing muscle mass as a result and this concerns him.  We had discussed that once he'd been under treatment for his PE for a few months, he may need a cardiac workup as it has been many years since he's seen cardiology and he's not always eaten healthily over the years.  Will refer to [facility] for further evaluation--echo and potentially stress test if they feel it's indicated.  He is sure he could not do an exercise stress test at this point.   ?

## 2022-05-10 ENCOUNTER — Other Ambulatory Visit: Payer: Self-pay | Admitting: Internal Medicine

## 2022-05-10 MED ORDER — ALLOPURINOL 100 MG PO TABS: 100.0000 mg | ORAL_TABLET | Freq: Every day | ORAL | 3 refills | Status: DC

## 2022-05-10 MED ORDER — COLCHICINE 0.6 MG PO TABS: 0.6000 mg | ORAL_TABLET | Freq: Every day | ORAL | 1 refills | Status: DC

## 2022-05-10 NOTE — Progress Notes (Signed)
Pt used colchicine for foot and ankle swelling with improvement suggesting he was actually having a gout flare this time.  Renewal of it and allopurinol which now will need to be taken again consistently sent to [facility]. ?

## 2022-05-15 ENCOUNTER — Encounter: Payer: Self-pay | Admitting: Cardiology

## 2022-05-15 ENCOUNTER — Ambulatory Visit: Payer: Medicare Other | Admitting: Cardiology

## 2022-05-15 ENCOUNTER — Ambulatory Visit: Payer: Medicare Other

## 2022-05-15 VITALS — BP 165/60 | HR 35 | Temp 97.2°F | Resp 16 | Ht 71.0 in | Wt 244.0 lb

## 2022-05-15 DIAGNOSIS — I2693 Single subsegmental pulmonary embolism without acute cor pulmonale: Secondary | ICD-10-CM

## 2022-05-15 DIAGNOSIS — I441 Atrioventricular block, second degree: Secondary | ICD-10-CM

## 2022-05-15 DIAGNOSIS — R0609 Other forms of dyspnea: Secondary | ICD-10-CM | POA: Diagnosis not present

## 2022-05-15 DIAGNOSIS — Z8774 Personal history of (corrected) congenital malformations of heart and circulatory system: Secondary | ICD-10-CM

## 2022-05-15 DIAGNOSIS — I10 Essential (primary) hypertension: Secondary | ICD-10-CM

## 2022-05-15 NOTE — Progress Notes (Signed)
? ?Primary Physician/Referring:  Kermit Balo, DO ? ?Patient ID: Dean Fry, male    DOB: 10-13-1946, 76 y.o.   MRN: [id number] ? ?Chief Complaint  ?Patient presents with  ? Chronic Pulmonary Embolism  ? DOE  ? New Patient (Initial Visit)  ?  Referred by Dr. Bufford Spikes  ? ?HPI:   ? ?Dean Fry  is a 76 y.o. Caucasian male patient with hypertension, hyperlipidemia, history of thoracotomy and ASD repair at age 76 years, history of stroke in 2009 1 week after the surgery, details not known, had an episode of gouty arthritis involving his right ankles and feet followed by mild swelling and discomfort and worsening dyspnea and was diagnosed with subsegmental right lung pulmonary embolism on 01/22/2022.  He is now referred to me for evaluation of marked dyspnea that has been progressive since. ? ?Patient states that he likes to exercise and go for a walk and also does some weights, recently over the past several months he has not been able to do any activities including simple weightlifting in the house without having to stop.  He has started to use a walker even though he has a very small home. ? ?No PND or orthopnea, no chest pain or palpitations, no dizziness or syncope. ? ?Past Medical History:  ?Diagnosis Date  ? Atrial septal defect   ? corrected in 1962  ? Cataracts, bilateral   ? "early stages"  ? Hypercholesteremia   ? Hypertension   ? Sees Dr. Dorris Fetch  ? Impaired hearing   ? Impaired vision   ? glasses  ? Low back pain   ? Osteoarthritis of both knees   ? left knee  ? Rheumatic fever   ? at 76 years old  ? Shoulder pain   ? left, sees chiropracter for   ? Stroke (HCC) 03/14/2008  ? pt states that he may have had a mild stroke 1 week after knee surgery  ? ?Past Surgical History:  ?Procedure Laterality Date  ? APPENDECTOMY  1979  ? ASD REPAIR    ? ASD REPAIR    ? CATARACT EXTRACTION Left   ? May 2017  ? CATARACT EXTRACTION Right   ? May 2017  ? KNEE SURGERY    ? 4 surgeries on right knee  ?  plantar fascititis    ? bilateral surgery   ? TONSILLECTOMY  1954  ? and removal of adnoids  ? TOTAL KNEE ARTHROPLASTY  2009  ? right knee, Dr.Randal   ? TOTAL KNEE ARTHROPLASTY  01/15/2013  ? Procedure: TOTAL KNEE ARTHROPLASTY;  Surgeon: Thera Flake., MD;  Location: [facility] OR;  Service: Orthopedics;  Laterality: Left;  left total knee arthroplasty  ? ?Family History  ?Adopted: Yes  ?Problem Relation Age of Onset  ? Heart attack Father   ? Pancreatic cancer Brother   ?  ?Social History  ? ?Tobacco Use  ? Smoking status: Never  ? Smokeless tobacco: Never  ?Substance Use Topics  ? Alcohol use: Yes  ?  Alcohol/week: 2.0 standard drinks  ?  Types: 2 Standard drinks or equivalent per week  ?  Comment: occasional use  ? ?Marital Status: Divorced  ?ROS  ?Review of Systems  ?Cardiovascular:  Positive for dyspnea on exertion. Negative for chest pain and leg swelling.  ?Objective  ?Blood pressure (!) 165/60, pulse (!) 35, temperature (!) 97.2 ?F (36.2 ?C), temperature source Temporal, resp. rate 16, height 5\' 11"  (1.803 m), weight 244 lb (110.7  kg), SpO2 94 %. Body mass index is 34.03 kg/m?.  ? ?  05/15/2022  ?  9:18 AM 11/20/2020  ?  8:32 AM 05/15/2020  ?  8:43 AM  ?Vitals with BMI  ?Height 5\' 11"  5\' 11"  5\' 11"   ?Weight 244 lbs 256 lbs 10 oz 259 lbs  ?BMI 34.05 35.8 36.14  ?Systolic 165 118 161122  ?Diastolic 60 78 82  ?Pulse 35 56 54  ?  ?Physical Exam ?Neck:  ?   Vascular: No JVD.  ?Cardiovascular:  ?   Rate and Rhythm: Regular rhythm. Bradycardia present.  ?   Pulses: Intact distal pulses.  ?   Heart sounds: Normal heart sounds. No murmur heard. ?  No gallop.  ?Pulmonary:  ?   Effort: Pulmonary effort is normal.  ?   Breath sounds: Normal breath sounds.  ?Abdominal:  ?   General: Bowel sounds are normal.  ?   Palpations: Abdomen is soft.  ?Musculoskeletal:  ?   Right lower leg: No edema.  ?   Left lower leg: No edema.  ? ? ?Medications and allergies  ?No Known Allergies  ? ?Medication list after today's encounter  ? ?Current  Outpatient Medications:  ?  allopurinol (ZYLOPRIM) 100 MG tablet, Take 1 tablet (100 mg total) by mouth daily., Disp: 90 tablet, Rfl: 3 ?  aspirin 81 MG tablet, Take 81 mg by mouth daily., Disp: , Rfl:  ?  atorvastatin (LIPITOR) 40 MG tablet, TAKE 1 TABLET BY MOUTH AT BEDTIME. GENERIC EQUIVALENT FOR LIPITOR, Disp: 90 tablet, Rfl: 3 ?  lisinopril-hydrochlorothiazide (ZESTORETIC) 20-25 MG tablet, TAKE 1 TABLET BY MOUTH EVERY DAY. GENERIC EQUIVALENT FOR ZESTORETIC, Disp: 90 tablet, Rfl: 1 ?  omega-3 acid ethyl esters (LOVAZA) 1 g capsule, TAKE 1 CAPSULE BY MOUTH THREE TIMES DAILY, Disp: 270 capsule, Rfl: 1 ? ?Laboratory examination:  ? ?Recent Labs  ?  05/16/21 ?0909 01/22/22 ?[id number]  ?NA 139  --   ?K 3.6  --   ?CL 101  --   ?CO2 27  --   ?GLUCOSE 89  --   ?BUN 24  --   ?CREATININE 0.92 1.30*  ?CALCIUM 9.1  --   ?GFRNONAA 81  --   ?GFRAA 94  --   ? ?CrCl cannot be calculated (Patient's most recent lab result is older than the maximum 21 days allowed.).  ? ?  Latest Ref Rng & Units 01/22/2022  ?  9:38 AM 05/16/2021  ?  9:09 AM 11/15/2020  ?  8:34 AM  ?CMP  ?Glucose 65 - 99 mg/dL  89   93    ?BUN 7 - 25 mg/dL  24   23    ?Creatinine 0.61 - 1.24 mg/dL 4.541.30   0.980.92   1.190.98    ?Sodium 135 - 146 mmol/L  139   140    ?Potassium 3.5 - 5.3 mmol/L  3.6   4.2    ?Chloride 98 - 110 mmol/L  101   103    ?CO2 20 - 32 mmol/L  27   30    ?Calcium 8.6 - 10.3 mg/dL  9.1   9.1    ?Total Protein 6.1 - 8.1 g/dL  6.8   7.1    ?Total Bilirubin 0.2 - 1.2 mg/dL  0.8   0.7    ?AST 10 - 35 U/L  18   24    ?ALT 9 - 46 U/L  16   22    ? ? ?  Latest Ref  Rng & Units 05/16/2021  ?  9:09 AM 11/15/2020  ?  8:34 AM 11/08/2019  ?  8:20 AM  ?CBC  ?WBC 3.8 - 10.8 Thousand/uL 5.6   6.1   6.1    ?Hemoglobin 13.2 - 17.1 g/dL 35.3   29.9   24.2    ?Hematocrit 38.5 - 50.0 % 43.4   41.5   40.8    ?Platelets 140 - 400 Thousand/uL 213   207   210    ? ? ?Lipid Panel ?Recent Labs  ?  05/16/21 ?0909  ?CHOL 115  ?TRIG 81  ?LDLCALC 66  ?HDL 33*  ?CHOLHDL 3.5  ? ? ?HEMOGLOBIN  A1C ?Lab Results  ?Component Value Date  ? HGBA1C 5.5 05/16/2021  ? MPG 111 05/16/2021  ? ?Radiology:  ? ?CT angiogram chest 01/22/2022: ?Nonocclusive pulmonary emboli in the right upper lobe, lower lobe, middle lobe and segmental branches. ?Normal heart size.  No other significant abnormalities. ?Cardiac Studies:  ? ?Echocardiogram 05/15/2022:  ?Normal LV systolic function with EF 59%. Left ventricle cavity is normal in size. Moderate concentric hypertrophy of the left ventricle. Normal global wall motion. Doppler evidence of grade I (impaired) diastolic dysfunction, normal LAP. Calculated EF 59%. ?Diastology may be erroneous due to underlying Mobitz II AV Block.  ?Left atrial cavity is moderately dilated at 4.8 cm. 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 ?Trileaflet aortic valve with no regurgitation. Mild aortic valve leaflet calcification. Mildly restricted aortic valve leaflets. No evidence of aortic valve stenosis. ? ?EKG:  ? ?EKG 05/15/2022: Underlying sinus rhythm at rate of 64 bpm with Mobitz 2 AV block, 2-1.  Ventricular rate 33 bpm.  Left bundle branch block.  No further analysis.   ? ?EKG 07/26/2019: Sinus rhythm with first-degree AV block at rate of 65 bpm, left axis deviation, left anterior fascicular block.  Right bundle branch block.  Trifascicular block. ? ?Assessment  ? ?  ICD-10-CM   ?1. Second degree Mobitz II AV block  I44.1 PCV ECHOCARDIOGRAM COMPLETE  ?  PCV ECHOCARDIOGRAM COMPLETE  ?  ?2. Dyspnea on exertion  R06.09   ?  ?3. Single subsegmental pulmonary embolism without acute cor pulmonale (HCC)  I26.93 EKG 12-Lead  ?  ?4. H/O congenital atrial septal defect (ASD) repair at age 62 Y by thorocotomy  Z87.74   ?  ?5. Primary hypertension  I10   ?  ?  ? ?Medications Discontinued During This Encounter  ?Medication Reason  ? colchicine 0.6 MG tablet   ? ibuprofen (ADVIL) 600 MG tablet   ? ELIQUIS 5 MG TABS tablet Completed Course  ?  ?No orders of the defined types were  placed in this encounter. ? ?Orders Placed This Encounter  ?Procedures  ? EKG 12-Lead  ? PCV ECHOCARDIOGRAM COMPLETE  ?  Standing Status:   Future  ?  Number of Occurrences:   1  ?  Standing Expiration D

## 2022-05-16 ENCOUNTER — Ambulatory Visit (HOSPITAL_COMMUNITY): Payer: Medicare Other

## 2022-05-16 ENCOUNTER — Ambulatory Visit (HOSPITAL_COMMUNITY)
Admission: RE | Disposition: A | Discharge status: Home / Self Care | Payer: Self-pay | Source: Home / Self Care | Attending: Internal Medicine

## 2022-05-16 ENCOUNTER — Other Ambulatory Visit: Payer: Self-pay

## 2022-05-16 ENCOUNTER — Ambulatory Visit (HOSPITAL_COMMUNITY)
Admission: RE | Admit: 2022-05-16 | Discharge: 2022-05-16 | Disposition: A | Discharge status: Home / Self Care | Payer: Medicare Other | Attending: Internal Medicine | Admitting: Internal Medicine

## 2022-05-16 DIAGNOSIS — Z86711 Personal history of pulmonary embolism: Secondary | ICD-10-CM | POA: Insufficient documentation

## 2022-05-16 DIAGNOSIS — R0600 Dyspnea, unspecified: Secondary | ICD-10-CM | POA: Insufficient documentation

## 2022-05-16 DIAGNOSIS — E785 Hyperlipidemia, unspecified: Secondary | ICD-10-CM | POA: Diagnosis not present

## 2022-05-16 DIAGNOSIS — R001 Bradycardia, unspecified: Secondary | ICD-10-CM | POA: Diagnosis not present

## 2022-05-16 DIAGNOSIS — I441 Atrioventricular block, second degree: Secondary | ICD-10-CM | POA: Diagnosis not present

## 2022-05-16 DIAGNOSIS — I10 Essential (primary) hypertension: Secondary | ICD-10-CM | POA: Insufficient documentation

## 2022-05-16 DIAGNOSIS — Z8673 Personal history of transient ischemic attack (TIA), and cerebral infarction without residual deficits: Secondary | ICD-10-CM | POA: Insufficient documentation

## 2022-05-16 DIAGNOSIS — Z950 Presence of cardiac pacemaker: Secondary | ICD-10-CM | POA: Diagnosis not present

## 2022-05-16 HISTORY — PX: PACEMAKER IMPLANT: EP1218

## 2022-05-16 LAB — CBC
HCT: 40.9 % (ref 39.0–52.0)
Hemoglobin: 13.9 g/dL (ref 13.0–17.0)
MCH: 29 pg (ref 26.0–34.0)
MCHC: 34 g/dL (ref 30.0–36.0)
MCV: 85.2 fL (ref 80.0–100.0)
Platelets: 206 10*3/uL (ref 150–400)
RBC: 4.8 MIL/uL (ref 4.22–5.81)
RDW: 14.5 % (ref 11.5–15.5)
WBC: 7.3 10*3/uL (ref 4.0–10.5)
nRBC: 0 % (ref 0.0–0.2)

## 2022-05-16 LAB — BASIC METABOLIC PANEL
Anion gap: 6 (ref 5–15)
BUN: 25 mg/dL — ABNORMAL HIGH (ref 8–23)
CO2: 27 mmol/L (ref 22–32)
Calcium: 9.3 mg/dL (ref 8.9–10.3)
Chloride: 105 mmol/L (ref 98–111)
Creatinine, Ser: 1.12 mg/dL (ref 0.61–1.24)
GFR, Estimated: >60 mL/min (ref >60)
Glucose, Bld: 92 mg/dL (ref 70–99)
Potassium: 3.9 mmol/L (ref 3.5–5.1)
Sodium: 138 mmol/L (ref 135–145)

## 2022-05-16 SURGERY — PACEMAKER IMPLANT

## 2022-05-16 MED ORDER — LIDOCAINE HCL 1 % IJ SOLN
INTRAMUSCULAR | Status: AC
  Filled 2022-05-16: qty 60

## 2022-05-16 MED ORDER — CEFAZOLIN SODIUM-DEXTROSE 1-4 GM/50ML-% IV SOLN
1.0000 g | Freq: Once | INTRAVENOUS | Status: AC
  Administered 2022-05-16: 1 g via INTRAVENOUS
  Filled 2022-05-16: qty 50

## 2022-05-16 MED ORDER — CEFAZOLIN SODIUM-DEXTROSE 2-4 GM/100ML-% IV SOLN
2.0000 g | INTRAVENOUS | Status: AC
  Administered 2022-05-16: 2 g via INTRAVENOUS

## 2022-05-16 MED ORDER — ACETAMINOPHEN 325 MG PO TABS
325.0000 mg | ORAL_TABLET | ORAL | Status: DC | PRN
  Administered 2022-05-16: 650 mg via ORAL
  Filled 2022-05-16: qty 2

## 2022-05-16 MED ORDER — CHLORHEXIDINE GLUCONATE 4 % EX LIQD
60.0000 mL | Freq: Once | CUTANEOUS | Status: DC
Start: 2022-05-16 — End: 2022-05-16

## 2022-05-16 MED ORDER — HEPARIN (PORCINE) IN NACL 1000-0.9 UT/500ML-% IV SOLN
INTRAVENOUS | Status: DC | PRN
  Administered 2022-05-16: 500 mL

## 2022-05-16 MED ORDER — SODIUM CHLORIDE 0.9 % IV SOLN: INTRAVENOUS | Status: DC

## 2022-05-16 MED ORDER — ONDANSETRON HCL 4 MG/2ML IJ SOLN: 4.0000 mg | Freq: Four times a day (QID) | INTRAMUSCULAR | Status: DC | PRN

## 2022-05-16 MED ORDER — SODIUM CHLORIDE 0.9 % IV SOLN
80.0000 mg | INTRAVENOUS | Status: AC
  Administered 2022-05-16: 80 mg

## 2022-05-16 MED ORDER — LISINOPRIL 20 MG PO TABS
20.0000 mg | ORAL_TABLET | Freq: Once | ORAL | Status: AC
  Administered 2022-05-16: 20 mg via ORAL
  Filled 2022-05-16: qty 1

## 2022-05-16 MED ORDER — LIDOCAINE HCL (PF) 1 % IJ SOLN
INTRAMUSCULAR | Status: DC | PRN
  Administered 2022-05-16: 60 mL

## 2022-05-16 MED ORDER — ASPIRIN 81 MG PO TABS: 81.0000 mg | ORAL_TABLET | Freq: Every day | ORAL | Status: DC

## 2022-05-16 MED ORDER — CEFAZOLIN SODIUM-DEXTROSE 2-4 GM/100ML-% IV SOLN
INTRAVENOUS | Status: AC
  Filled 2022-05-16: qty 100

## 2022-05-16 MED ORDER — CHLORHEXIDINE GLUCONATE 4 % EX LIQD: 60.0000 mL | Freq: Once | CUTANEOUS | Status: DC

## 2022-05-16 MED ORDER — SODIUM CHLORIDE 0.9 % IV SOLN
INTRAVENOUS | Status: AC
  Filled 2022-05-16: qty 2

## 2022-05-16 SURGICAL SUPPLY — 13 items
CABLE SURGICAL S-101-97-12 (CABLE) ×2 IMPLANT
CATH CPS LOCATOR 3D MED (CATHETERS) ×1 IMPLANT
HELIX LOCKING TOOL (MISCELLANEOUS) ×2
LEAD TENDRIL MRI 52CM LPA1200M (Lead) ×1 IMPLANT
LEAD TENDRIL SDX 2088TC-58CM (Lead) ×1 IMPLANT
PACEMAKER ASSURITY DR-RF (Pacemaker) ×1 IMPLANT
PAD DEFIB RADIO PHYSIO CONN (PAD) ×2 IMPLANT
SHEATH 8FR PRELUDE SNAP 13 (SHEATH) ×1 IMPLANT
SHEATH 9FR PRELUDE SNAP 13 (SHEATH) ×1 IMPLANT
SLITTER AGILIS HISPRO (INSTRUMENTS) ×1 IMPLANT
TOOL HELIX LOCKING (MISCELLANEOUS) IMPLANT
TRAY PACEMAKER INSERTION (PACKS) ×2 IMPLANT
WIRE GUIDERIGHT .032X150 (WIRE) ×1 IMPLANT

## 2022-05-16 NOTE — Discharge Instructions (Signed)
Care After Your Ablation and Pacemaker   You have a [employer] Pacemaker  ACTIVITY Do not lift your arm above shoulder height for 1 week after your procedure. After 7 days, you may progress as below.  You should remove your sling 24 hours after your procedure, unless otherwise instructed by your provider.     Thursday May 23, 2022  Friday May 24, 2022 Saturday May 25, 2022 Sunday May 26, 2022   Do not lift, push, pull, or carry anything over 10 pounds with the affected arm until 6 weeks (Thursday June 27, 2022 ) after your procedure.   You may drive AFTER your wound check, unless you have been told otherwise by your provider.   Ask your healthcare provider when you can go back to work   INCISION/Dressing   If large square, outer bandage is left in place on your chest, this can be removed after 24 hours from your procedure. Do not remove steri-strips or glue as below.   Monitor your Pacemaker site for redness, swelling, and drainage. Call the device clinic at [phone] if you experience these symptoms or fever/chills.  If your incision is sealed with Steri-strips or staples, you may shower 7 days after your procedure or when told by your provider. Do not remove the steri-strips or let the shower hit directly on your site. You may wash around your site with soap and water.    If you were discharged in a sling, please do not wear this during the day more than 48 hours after your surgery unless otherwise instructed. This may increase the risk of stiffness and soreness in your shoulder.   Avoid lotions, ointments, or perfumes over your incision until it is well-healed.  You may use a hot tub or a pool AFTER your wound check appointment if the incision is completely closed.  Pacemaker Alerts:  Some alerts are vibratory and others beep. These are NOT emergencies. Please call our office to let us know. If this occurs at night or on weekends, it can wait until the next business day. Send a  remote transmission.  If your device is capable of reading fluid status (for heart failure), you will be offered monthly monitoring to review this with you.   DEVICE MANAGEMENT Remote monitoring is used to monitor your pacemaker from home. This monitoring is scheduled every 91 days by our office. It allows [place] to keep an eye on the functioning of your device to ensure it is working properly. You will routinely see your Electrophysiologist annually (more often if necessary).   You should receive your ID card for your new device in 4-8 weeks. Keep this card with you at all times once received. Consider wearing a medical alert bracelet or necklace.  Your Pacemaker may be MRI compatible. This will be discussed at your next office visit/wound check.  You should avoid contact with strong electric or magnetic fields.   Do not use amateur (ham) radio equipment or electric (arc) welding torches. MP3 player headphones with magnets should not be used. Some devices are safe to use if held at least 12 inches (30 cm) from your Pacemaker. These include power tools, lawn mowers, and speakers. If you are unsure if something is safe to use, ask your health care provider.  When using your cell phone, hold it to the ear that is on the opposite side from the Pacemaker. Do not leave your cell phone in a pocket over the Pacemaker.  You may safely use  electric blankets, heating pads, computers, and microwave ovens.  What can I expect after the procedure? After the procedure, it is common to have: Bruising around your puncture site. Tenderness around your puncture site. Skipped heartbeats. Tiredness (fatigue).  Contact a health care provider if: You have redness, mild swelling, or pain around your puncture site. You have fluid or blood coming from your puncture site that stops after applying firm pressure to the area. Your puncture site feels warm to the touch. You have pus or a bad smell coming from your puncture  site. You have a fever. You have chest pain or discomfort that spreads to your neck, jaw, or arm. You are sweating a lot. You feel nauseous. You have a fast or irregular heartbeat. You have shortness of breath. You are dizzy or light-headed and feel the need to lie down. You have pain or numbness in the arm or leg closest to your puncture site.  Call the office right away if: You have chest pain. You feel more short of breath than you have felt before. You feel more light-headed than you have felt before. Your incision starts to open up. Your puncture site suddenly swells. Your puncture site is bleeding and the bleeding does not stop after applying firm pressure to the area. These symptoms may represent a serious problem that is an emergency. Do not wait to see if the symptoms will go away. Get medical help right away. Call your local emergency services (911 in the [place] Do not drive yourself to the hospital.  Puncture (groin) site care  Follow instructions from your health care provider about how to take care of your puncture site. Make sure you: If present, leave stitches (sutures), skin glue, or adhesive strips in place. These skin closures may need to stay in place for up to 2 weeks. If adhesive strip edges start to loosen and curl up, you may trim the loose edges. Do not remove adhesive strips completely unless your health care provider tells you to do that. If a large square bandage is present on your groin site, this may be removed 24 hours after surgery.  Check your puncture site every day for signs of infection. Check for: Redness, swelling, or pain. Fluid or blood. If your puncture site starts to bleed, lie down on your back, apply firm pressure to the area, and contact your health care provider. Warmth. Pus or a bad smell. A pea or small marble sized lump at the site is normal and can take up to three months to resolve.  Driving As above, you may drive after your wound  check, unless otherwise directed by your provider.  Do not drive or use heavy machinery while taking prescription pain medicine. Activity Avoid activities that take a lot of effort for at least 7 days after your procedure. Do not lift anything that is heavier than 5 lb (4.5 kg) for one week. After that, follow restrictions for your device as above.  No sexual activity for 1 week.  Return to your normal activities as told by your health care provider. Ask your health care provider what activities are safe for you. General instructions Take over-the-counter and prescription medicines only as told by your health care provider. Do not use any products that contain nicotine or tobacco, such as cigarettes and e-cigarettes. If you need help quitting, ask your health care provider. Do not drink alcohol for 24 hours after your procedure. Keep all follow-up visits as told by your health care  provider. This is important.  This information is not intended to replace advice given to you by your health care provider. Make sure you discuss any questions you have with your health care provider.

## 2022-05-16 NOTE — H&P (Addendum)
ELECTROPHYSIOLOGY CONSULT NOTE    Patient ID: Dean Fry MRN: [id number], DOB/AGE: 09/22/1946 76 y.o.  Admit date: (Not on file) Date of Consult: 05/16/2022  Primary Physician: Gayland Curry, DO Primary Cardiologist: Dr. Einar Gip Electrophysiologist:  New to Dr. Lovena Le  Referring Provider: Dr. Einar Gip Reason for admission: Symptomatic second degree AV block  Patient Profile: Dean Fry is a 76 y.o. male with a history of HTN, HLD, h/o thoracotomy and ASD repair at age of 42 years, h/o CVA, gout, arthritis, and h/o PE 01/22/2022 who is being seen today for the evaluation of symptomatic second degree block at the request of Dr. Einar Gip.  HPI:  Dean Fry is a 76 y.o. male with medical history as above.   Seen by Dr. Einar Gip in office 5/17 with complaints of progressive dyspnea. Pt stated he likes to exercise and go for a walk and also does some weights. Though over the past several months he has not been able to do simple/or light weights around the house without quickly fatiguing. He has started to use a walker around the house even around his small home.  He has not had syncope, orthopnea, chest pain, palpitations, or dizziness.   Past Medical History:  Diagnosis Date   Atrial septal defect    corrected in 1962   Cataracts, bilateral    "early stages"   Hypercholesteremia    Hypertension    Sees Dr. Jose Persia   Impaired hearing    Impaired vision    glasses   Low back pain    Osteoarthritis of both knees    left knee   Rheumatic fever    at 76 years old   Shoulder pain    left, sees chiropracter for    Stroke ([place]) 03/14/2008   pt states that he may have had a mild stroke 1 week after knee surgery     Surgical History:  Past Surgical History:  Procedure Laterality Date   APPENDECTOMY  1979   ASD REPAIR     ASD REPAIR     CATARACT EXTRACTION Left    May 2017   CATARACT EXTRACTION Right    May 2017   KNEE SURGERY     4 surgeries on right  knee   plantar fascititis     bilateral surgery    TONSILLECTOMY  1954   and removal of adnoids   TOTAL KNEE ARTHROPLASTY  2009   right knee, Dr.Randal    TOTAL KNEE ARTHROPLASTY  01/15/2013   Procedure: TOTAL KNEE ARTHROPLASTY;  Surgeon: Yvette Rack., MD;  Location: [facility];  Service: Orthopedics;  Laterality: Left;  left total knee arthroplasty     No medications prior to admission.    Inpatient Medications:   Allergies: No Known Allergies  Social History   Socioeconomic History   Marital status: Divorced    Spouse name: Not on file   Number of children: 0   Years of education: Not on file   Highest education level: Not on file  Occupational History   Not on file  Tobacco Use   Smoking status: Never   Smokeless tobacco: Never  Vaping Use   Vaping Use: Never used  Substance and Sexual Activity   Alcohol use: Yes    Alcohol/week: 2.0 standard drinks    Types: 2 Standard drinks or equivalent per week    Comment: occasional use   Drug use: No   Sexual activity: Not Currently  Other Topics Concern  Not on file  Social History Narrative   Not on file   Social Determinants of Health   Financial Resource Strain: Not on file  Food Insecurity: Not on file  Transportation Needs: Not on file  Physical Activity: Not on file  Stress: Not on file  Social Connections: Not on file  Intimate Partner Violence: Not on file     Family History  Adopted: Yes  Problem Relation Age of Onset   Heart attack Father    Pancreatic cancer Brother      Review of Systems: All other systems reviewed and are otherwise negative except as noted above.  Physical Exam: HR 35 BP 165/60 Weight 244 lbs  GEN- The patient is well appearing, alert and oriented x 3 today.   HEENT: normocephalic, atraumatic; sclera clear, conjunctiva pink; hearing intact; oropharynx clear; neck supple Lungs- Clear to ausculation bilaterally, normal work of breathing.  No wheezes, rales, rhonchi Heart-  Regular rate and rhythm, no murmurs, rubs or gallops GI- soft, non-tender, non-distended, bowel sounds present Extremities- no clubbing, cyanosis, or edema; DP/PT/radial pulses 2+ bilaterally MS- no significant deformity or atrophy Skin- warm and dry, no rash or lesion Psych- euthymic mood, full affect Neuro- strength and sensation are intact  Labs:   Lab Results  Component Value Date   WBC 5.6 05/16/2021   HGB 14.3 05/16/2021   HCT 43.4 05/16/2021   MCV 87.1 05/16/2021   PLT 213 05/16/2021   No results for input(s): NA, K, CL, CO2, BUN, CREATININE, CALCIUM, PROT, BILITOT, ALKPHOS, ALT, AST, GLUCOSE in the last 168 hours.  Invalid input(s): LABALBU    Radiology/Studies: PCV ECHOCARDIOGRAM COMPLETE  Result Date: 05/15/2022 Echocardiogram 05/15/2022: Normal LV systolic function with EF 59%. Left ventricle cavity is normal in size. Moderate concentric hypertrophy of the left ventricle. Normal global wall motion. Doppler evidence of grade I (impaired) diastolic dysfunction, normal LAP. Calculated EF 59%. Diastology may be erroneous due to underlying Mobitz II AV Block. Left atrial cavity is moderately dilated at 4.8 cm. 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 Trileaflet aortic valve with no regurgitation. Mild aortic valve leaflet calcification. Mildly restricted aortic valve leaflets. No evidence of aortic valve stenosis.   EKG: EKG showed 2:1 AV block with a ventricular rate of 33 bpm with a left bundle branch block.  (personally reviewed)  Assessment/Plan: 1.  Second degree AV block 2. Symptomatic bradycardia With baseline conduction disease.  Pt not on AV nodal agents.  Explained risks, benefits, and alternatives to PPM implantation, including but not limited to bleeding, infection, pneumothorax, pericardial effusion, lead dislodgement, heart attack, stroke, or death.  Pt verbalized understanding and agrees to proceed   3. H/o PE Provoked, 12/2021 after  a 270 mile road trip.  Pt was on eliquis and has completed course.  This has been held for pacing and will not likely need to be resumed.    For questions or updates, please contact [facility] Please consult [email or web address] for contact info under Cardiology/STEMI.  Jacalyn Lefevre, PA-C  05/16/2022 8:30 AM  EP Attending  Patient seen and examined. Agree with above. The patient presents today with symptomatic 2:1 AV block for PPM insertion. He is not on any AV nodal blocking drugs. I have discussed the indications/risks/benefits/goals/expectations and he is willing to proceed.   Carleene Overlie Brittan Butterbaugh,MD

## 2022-05-17 ENCOUNTER — Encounter (HOSPITAL_COMMUNITY): Payer: Self-pay | Admitting: Internal Medicine

## 2022-05-17 ENCOUNTER — Telehealth: Payer: Self-pay | Admitting: Internal Medicine

## 2022-05-17 MED FILL — Lidocaine HCl Local Inj 1%: INTRAMUSCULAR | Qty: 60 | Status: AC

## 2022-05-17 NOTE — Telephone Encounter (Signed)
Vascular lab called to say that they had been unable to reach pt since 5/2 to schedule venous doppler I'd ordered.  Odd b/c pt had said he'd never heard from them. His swelling entirely resolved and it appears it was gout-related this time.  His shortness of breath has wound up being due to arrhythmia and he's had a pacemaker placed by Dr. Lovena Le yesterday.  The venous doppler is no longer necessary.

## 2022-05-19 ENCOUNTER — Encounter: Payer: Self-pay | Admitting: Cardiology

## 2022-05-19 ENCOUNTER — Telehealth: Payer: Self-pay | Admitting: Cardiology

## 2022-05-19 DIAGNOSIS — Z45018 Encounter for adjustment and management of other part of cardiac pacemaker: Secondary | ICD-10-CM

## 2022-05-19 DIAGNOSIS — Z950 Presence of cardiac pacemaker: Secondary | ICD-10-CM

## 2022-05-19 DIAGNOSIS — I441 Atrioventricular block, second degree: Secondary | ICD-10-CM | POA: Insufficient documentation

## 2022-05-19 HISTORY — DX: Encounter for adjustment and management of other part of cardiac pacemaker: Z45.018

## 2022-05-19 HISTORY — DX: Atrioventricular block, second degree: I44.1

## 2022-05-19 HISTORY — DX: Presence of cardiac pacemaker: Z95.0

## 2022-05-20 ENCOUNTER — Telehealth: Payer: Self-pay | Admitting: Internal Medicine

## 2022-05-20 NOTE — Telephone Encounter (Signed)
Pt reports redness and swelling of foot may be returning after stopping colcrys.  Also is newly back on allopurinol.  Advised to resume colcrys for a few days and monitor for improvement.  Not painful yet.    He's doing much better with his energy and breathing since the pacemaker was implanted.

## 2022-05-20 NOTE — Telephone Encounter (Signed)
Good morning, I have released the patient to you. Hope you have a wonderful day.

## 2022-05-29 ENCOUNTER — Ambulatory Visit (INDEPENDENT_AMBULATORY_CARE_PROVIDER_SITE_OTHER): Payer: Medicare Other

## 2022-05-29 DIAGNOSIS — I441 Atrioventricular block, second degree: Secondary | ICD-10-CM | POA: Diagnosis not present

## 2022-05-29 NOTE — Patient Instructions (Signed)

## 2022-05-31 NOTE — Progress Notes (Signed)
Wound check appointment. Steri-strips removed. Wound without redness or edema. Incision edges approximated, wound well healed. Normal device function. Thresholds, sensing, and impedances consistent with implant measurements. Device programmed at 3.5V/auto capture programmed on for extra safety margin until 3 month visit. Histogram distribution appropriate for patient and level of activity. AT/AF burden <1%, EGM appears AT w/ 10 seconds in duration. No high ventricular rates noted. Patient educated about wound care, arm mobility, lifting restrictions. ROV in 3 months with implanting physician.

## 2022-06-01 ENCOUNTER — Encounter (INDEPENDENT_AMBULATORY_CARE_PROVIDER_SITE_OTHER): Payer: Self-pay

## 2022-06-03 LAB — CUP PACEART INCLINIC DEVICE CHECK
Battery Remaining Longevity: 50 mo
Battery Voltage: 3.01 V
Brady Statistic RA Percent Paced: 70 %
Brady Statistic RV Percent Paced: 99 %
Date Time Interrogation Session: 20230531145600
Implantable Lead Implant Date: 20230518
Implantable Lead Implant Date: 20230518
Implantable Lead Location: 753859
Implantable Lead Location: 753860
Implantable Pulse Generator Implant Date: 20230518
Lead Channel Impedance Value: 450 Ohm
Lead Channel Impedance Value: 512.5 Ohm
Lead Channel Pacing Threshold Amplitude: 0.75 V
Lead Channel Pacing Threshold Amplitude: 0.75 V
Lead Channel Pacing Threshold Amplitude: 1 V
Lead Channel Pacing Threshold Amplitude: 1 V
Lead Channel Pacing Threshold Pulse Width: 0.5 ms
Lead Channel Pacing Threshold Pulse Width: 0.5 ms
Lead Channel Pacing Threshold Pulse Width: 0.5 ms
Lead Channel Pacing Threshold Pulse Width: 0.5 ms
Lead Channel Sensing Intrinsic Amplitude: 11.1 mV
Lead Channel Sensing Intrinsic Amplitude: 2.6 mV
Lead Channel Setting Pacing Amplitude: 3.5 V
Lead Channel Setting Pacing Amplitude: 5 V
Lead Channel Setting Pacing Pulse Width: 0.5 ms
Lead Channel Setting Sensing Sensitivity: 4 mV
Pulse Gen Model: 2272
Pulse Gen Serial Number: 8081973

## 2022-06-25 DIAGNOSIS — M5136 Other intervertebral disc degeneration, lumbar region: Secondary | ICD-10-CM | POA: Diagnosis not present

## 2022-06-25 DIAGNOSIS — M9905 Segmental and somatic dysfunction of pelvic region: Secondary | ICD-10-CM | POA: Diagnosis not present

## 2022-06-25 DIAGNOSIS — M9903 Segmental and somatic dysfunction of lumbar region: Secondary | ICD-10-CM | POA: Diagnosis not present

## 2022-06-25 DIAGNOSIS — M9904 Segmental and somatic dysfunction of sacral region: Secondary | ICD-10-CM | POA: Diagnosis not present

## 2022-07-24 ENCOUNTER — Other Ambulatory Visit: Payer: Self-pay | Admitting: Internal Medicine

## 2022-07-24 DIAGNOSIS — M1A4790 Other secondary chronic gout, unspecified ankle and foot, without tophus (tophi): Secondary | ICD-10-CM

## 2022-07-24 MED ORDER — COLCHICINE 0.6 MG PO TABS: 0.6000 mg | ORAL_TABLET | Freq: Every day | ORAL | 1 refills | Status: DC

## 2022-07-24 MED ORDER — ALLOPURINOL 100 MG PO TABS: 100.0000 mg | ORAL_TABLET | Freq: Every day | ORAL | 3 refills | Status: DC

## 2022-07-24 NOTE — Progress Notes (Signed)
Pt sent message requesting "gout med" renewal.  Renewed allopurinol to take daily and prn colchicine in case of flare-up while working at [employer].

## 2022-08-02 ENCOUNTER — Other Ambulatory Visit: Payer: Self-pay | Admitting: Internal Medicine

## 2022-08-02 DIAGNOSIS — I10 Essential (primary) hypertension: Secondary | ICD-10-CM

## 2022-08-02 DIAGNOSIS — E785 Hyperlipidemia, unspecified: Secondary | ICD-10-CM

## 2022-08-02 MED ORDER — LISINOPRIL-HYDROCHLOROTHIAZIDE 20-25 MG PO TABS: 1.0000 | ORAL_TABLET | Freq: Every day | ORAL | 3 refills | Status: DC

## 2022-08-02 MED ORDER — ATORVASTATIN CALCIUM 40 MG PO TABS: 40.0000 mg | ORAL_TABLET | Freq: Every day | ORAL | 3 refills | Status: DC

## 2022-08-02 NOTE — Progress Notes (Signed)
Rxs renewed for [facility] for bp and cholesterol meds.

## 2022-08-15 ENCOUNTER — Telehealth: Payer: Self-pay

## 2022-08-15 NOTE — Telephone Encounter (Signed)
Pt returning a call.

## 2022-08-15 NOTE — Telephone Encounter (Signed)
Returned patients call. Advised patient that AF was noted on his remote and Dr. Ladona Ridgel wild discuss further at his upcoming apt. Patient appreciative of call.

## 2022-08-15 NOTE — Telephone Encounter (Signed)
[employer] Alert  Unscheduled remote reviewed. Normal device function.   7 AMS, AF controlled rates, longest duration 4hrs. Burden <1%, ASA only. Route to triage.  Patient is back in normal rhythm.  No answer, LMTCB.  After review, noted patient has apt with [facility] 08/20/22 and AF could be discussed at that apt. Note added to apt.

## 2022-08-20 ENCOUNTER — Ambulatory Visit: Payer: Medicare Other | Admitting: Internal Medicine

## 2022-08-20 ENCOUNTER — Encounter: Payer: Self-pay | Admitting: Internal Medicine

## 2022-08-20 VITALS — BP 118/66 | HR 74 | Ht 70.0 in | Wt 240.8 lb

## 2022-08-20 DIAGNOSIS — I441 Atrioventricular block, second degree: Secondary | ICD-10-CM

## 2022-08-20 DIAGNOSIS — Z950 Presence of cardiac pacemaker: Secondary | ICD-10-CM | POA: Diagnosis not present

## 2022-08-20 MED ORDER — APIXABAN 5 MG PO TABS: 5.0000 mg | ORAL_TABLET | Freq: Two times a day (BID) | ORAL | 5 refills | Status: DC

## 2022-08-20 NOTE — Patient Instructions (Addendum)
Medication Instructions:  Your physician has recommended you make the following change in your medication:    START TAKING:  ELIQUIS ( 5 MG )  TAKE ONE TABLET ( 5 MG ) BY MOUTH TWICE A DAY.     Lab Work: None ordered.  If you have labs (blood work) drawn today and your tests are completely normal, you will receive your results only by: MyChart Message (if you have MyChart) OR A paper copy in the mail If you have any lab test that is abnormal or we need to change your treatment, we will call you to review the results.  Testing/Procedures: None ordered.  Follow-Up: IN 1 YEAR WITH DR Lewayne Bunting, [facility] ON [address].     Important Information About Sugar      Apixaban Tablets What is this medication? APIXABAN (a PIX a ban) prevents or treats blood clots. It is also used to lower the risk of stroke in people with AFib (atrial fibrillation). It belongs to a group of medications called blood thinners. This medicine may be used for other purposes; ask your health care provider or pharmacist if you have questions. COMMON BRAND NAME(S): Eliquis What should I tell my care team before I take this medication? They need to know if you have any of these conditions: Antiphospholipid antibody syndrome Bleeding disorder History of bleeding in the brain History of blood clots History of stomach bleeding Kidney disease Liver disease Mechanical heart valve Spinal surgery An unusual or allergic reaction to apixaban, other medications, foods, dyes, or preservatives Pregnant or trying to get pregnant Breast-feeding How should I use this medication? Take this medication by mouth. For your therapy to work as well as possible, take each dose exactly as prescribed on the prescription label. Do not skip doses. Skipping doses or stopping this medication can increase your risk of a blood clot or stroke. Keep taking this medication unless your care team tells you to stop. Take it as directed  on the prescription label at the same time every day. You can take it with or without food. If it upsets your stomach, take it with food. A special MedGuide will be given to you by the pharmacist with each prescription and refill. Be sure to read this information carefully each time. Talk to your care team about the use of this medication in children. Special care may be needed. Overdosage: If you think you have taken too much of this medicine contact a poison control center or emergency room at once. NOTE: This medicine is only for you. Do not share this medicine with others. What if I miss a dose? If you miss a dose, take it as soon as you can. If it is almost time for your next dose, take only that dose. Do not take double or extra doses. What may interact with this medication? This medication may interact with the following: Aspirin and aspirin-like medications Certain medications for fungal infections like itraconazole and ketoconazole Certain medications for seizures like carbamazepine and phenytoin Certain medications for blood clots like enoxaparin, dalteparin, heparin, and warfarin Clarithromycin NSAIDs, medications for pain and inflammation, like ibuprofen or naproxen Rifampin Ritonavir St. John's wort This list may not describe all possible interactions. Give your health care provider a list of all the medicines, herbs, non-prescription drugs, or dietary supplements you use. Also tell them if you smoke, drink alcohol, or use illegal drugs. Some items may interact with your medicine. What should I watch for while using this medication? Visit  your healthcare professional for regular checks on your progress. You may need blood work done while you are taking this medication. Your condition will be monitored carefully while you are receiving this medication. It is important not to miss any appointments. Avoid sports and activities that might cause injury while you are using this  medication. Severe falls or injuries can cause unseen bleeding. Be careful when using sharp tools or knives. Consider using an Neurosurgeon. Take special care brushing or flossing your teeth. Report any injuries, bruising, or red spots on the skin to your healthcare professional. If you are going to need surgery or other procedure, tell your healthcare professional that you are taking this medication. Wear a medical ID bracelet or chain. Carry a card that describes your disease and details of your medication and dosage times. What side effects may I notice from receiving this medication? Side effects that you should report to your care team as soon as possible: Allergic reactions--skin rash, itching, hives, swelling of the face, lips, tongue, or throat Bleeding--bloody or black, tar-like stools, vomiting blood or brown material that looks like coffee grounds, red or dark brown urine, small red or purple spots on the skin, unusual bruising or bleeding Bleeding in the brain--severe headache, stiff neck, confusion, dizziness, change in vision, numbness or weakness of the face, arm, or leg, trouble speaking, trouble walking, vomiting Heavy periods This list may not describe all possible side effects. Call your doctor for medical advice about side effects. You may report side effects to FDA at 1-800-FDA-1088. Where should I keep my medication? Keep out of the reach of children and pets. Store at room temperature between 20 and 25 degrees C (68 and 77 degrees F). Get rid of any unused medication after the expiration date. To get rid of medications that are no longer needed or expired: Take the medication to a medication take-back program. Check with your pharmacy or law enforcement to find a location. If you cannot return the medication, check the label or package insert to see if the medication should be thrown out in the garbage or flushed down the toilet. If you are not sure, ask your care team. If it  is safe to put in the trash, empty the medication out of the container. Mix the medication with cat litter, dirt, coffee grounds, or other unwanted substance. Seal the mixture in a bag or container. Put it in the trash. NOTE: This sheet is a summary. It may not cover all possible information. If you have questions about this medicine, talk to your doctor, pharmacist, or health care provider.  2023 Elsevier/Gold Standard (2021-01-12 00:00:00)

## 2022-08-20 NOTE — Progress Notes (Signed)
HPI [name] returns today for PPM followup. He is a pleasant 76 yo man with a h/o CHB, s/p PPM insertion about 3 months ago. In the interim he has done well. He has a remote h/o PE. He has a remote thoracotomy for an ASD repair.  No Known Allergies   Current Outpatient Medications  Medication Sig Dispense Refill   allopurinol (ZYLOPRIM) 100 MG tablet Take 1 tablet (100 mg total) by mouth daily. 90 tablet 3   apixaban (ELIQUIS) 5 MG TABS tablet Take 1 tablet (5 mg total) by mouth 2 (two) times daily. 60 tablet 5   aspirin 81 MG tablet Take 1 tablet (81 mg total) by mouth daily. 30 tablet    atorvastatin (LIPITOR) 40 MG tablet Take 1 tablet (40 mg total) by mouth daily. 90 tablet 3   colchicine 0.6 MG tablet Take 1 tablet (0.6 mg total) by mouth daily. As needed for gout flare ups 30 tablet 1   lisinopril-hydrochlorothiazide (ZESTORETIC) 20-25 MG tablet Take 1 tablet by mouth daily. 90 tablet 3   omega-3 acid ethyl esters (LOVAZA) 1 g capsule TAKE 1 CAPSULE BY MOUTH THREE TIMES DAILY 270 capsule 1   No current facility-administered medications for this visit.     Past Medical History:  Diagnosis Date   Atrial septal defect    corrected in 1962   Cataracts, bilateral    "early stages"   Encounter for care of pacemaker 05/19/2022   Hypercholesteremia    Hypertension    Sees Dr. Dorris Fry   Impaired hearing    Impaired vision    glasses   Low back pain    Osteoarthritis of both knees    left knee   Pacemaker Dual chamber [employer] Dr-Rf - [id number]  05/19/2022   Rheumatic fever    at 76 years old   Second degree Mobitz II AV block 05/19/2022   Shoulder pain    left, sees chiropracter for    Stroke ([facility]) 03/14/2008   pt states that he may have had a mild stroke 1 week after knee surgery    ROS:   All systems reviewed and negative except as noted in the HPI.   Past Surgical History:  Procedure Laterality Date   APPENDECTOMY  1979   ASD REPAIR      ASD REPAIR     CATARACT EXTRACTION Left    May 2017   CATARACT EXTRACTION Right    May 2017   KNEE SURGERY     4 surgeries on right knee   PACEMAKER IMPLANT N/A 05/16/2022   Procedure: PACEMAKER IMPLANT;  Surgeon: Dean Maw, MD;  Location: [facility];  Service: Cardiovascular;  Laterality: N/A;   plantar fascititis     bilateral surgery    TONSILLECTOMY  1954   and removal of adnoids   TOTAL KNEE ARTHROPLASTY  2009   right knee, Dr.Randal    TOTAL KNEE ARTHROPLASTY  01/15/2013   Procedure: TOTAL KNEE ARTHROPLASTY;  Surgeon: Dean Flake., MD;  Location: [facility];  Service: Orthopedics;  Laterality: Left;  left total knee arthroplasty     Family History  Adopted: Yes  Problem Relation Age of Onset   Heart attack Father    Pancreatic cancer Brother      Social History   Socioeconomic History   Marital status: Divorced    Spouse name: Not on file   Number of children: 0   Years of education:  Not on file   Highest education level: Not on file  Occupational History   Not on file  Tobacco Use   Smoking status: Never   Smokeless tobacco: Never  Vaping Use   Vaping Use: Never used  Substance and Sexual Activity   Alcohol use: Yes    Alcohol/week: 2.0 standard drinks of alcohol    Types: 2 Standard drinks or equivalent per week    Comment: occasional use   Drug use: No   Sexual activity: Not Currently  Other Topics Concern   Not on file  Social History Narrative   Not on file   Social Determinants of Health   Financial Resource Strain: Low Risk  (10/19/2018)   Overall Financial Resource Strain (CARDIA)    Difficulty of Paying Living Expenses: Not hard at all  Food Insecurity: No Food Insecurity (10/19/2018)   Hunger Vital Sign    Worried About Running Out of Food in the Last Year: Never true    Ran Out of Food in the Last Year: Never true  Transportation Needs: No Transportation Needs (10/19/2018)   PRAPARE - Administrator, Civil Service  (Medical): No    Lack of Transportation (Non-Medical): No  Physical Activity: Sufficiently Active (10/19/2018)   Exercise Vital Sign    Days of Exercise per Week: 7 days    Minutes of Exercise per Session: 30 min  Stress: No Stress Concern Present (10/19/2018)   [employer] of Occupational Health - Occupational Stress Questionnaire    Feeling of Stress : Not at all  Social Connections: Socially Isolated (10/19/2018)   Social Connection and Isolation Panel [NHANES]    Frequency of Communication with Friends and Family: Never    Frequency of Social Gatherings with Friends and Family: Twice a week    Attends Religious Services: Never    Database administrator or Organizations: No    Attends Banker Meetings: Never    Marital Status: Divorced  Catering manager Violence: Not At Risk (10/19/2018)   Humiliation, Afraid, Rape, and Kick questionnaire    Fear of Current or Ex-Partner: No    Emotionally Abused: No    Physically Abused: No    Sexually Abused: No     BP 118/66   Pulse 74   Ht 5\' 10"  (1.778 m)   Wt 240 lb 12.8 oz (109.2 kg)   SpO2 94%   BMI 34.55 kg/m   Physical Exam:  Well appearing NAD HEENT: Unremarkable Neck:  No JVD, no thyromegally Lymphatics:  No adenopathy Back:  No CVA tenderness Lungs:  Clear with no wheezes HEART:  Regular rate rhythm, no murmurs, no rubs, no clicks Abd:  soft, positive bowel sounds, no organomegally, no rebound, no guarding Ext:  2 plus pulses, no edema, no cyanosis, no clubbing Skin:  No rashes no nodules Neuro:  CN II through XII intact, motor grossly intact  EKG - nsr with ventricular pacing  DEVICE  Normal device function.  See PaceArt for details.   Assess/Plan:  Heart block - he is stable s/p PPM insertion and is asymptomatic. Dyspnea - he has class 2 symptoms.  Pulmonary embolism - he is off of anti-coagulation.   Liesa Tsan,MD

## 2022-08-28 ENCOUNTER — Ambulatory Visit: Payer: Medicare Other

## 2022-09-18 DIAGNOSIS — M9905 Segmental and somatic dysfunction of pelvic region: Secondary | ICD-10-CM | POA: Diagnosis not present

## 2022-09-18 DIAGNOSIS — M5136 Other intervertebral disc degeneration, lumbar region: Secondary | ICD-10-CM | POA: Diagnosis not present

## 2022-09-18 DIAGNOSIS — M9903 Segmental and somatic dysfunction of lumbar region: Secondary | ICD-10-CM | POA: Diagnosis not present

## 2022-09-18 DIAGNOSIS — M9904 Segmental and somatic dysfunction of sacral region: Secondary | ICD-10-CM | POA: Diagnosis not present

## 2022-09-27 DIAGNOSIS — H52203 Unspecified astigmatism, bilateral: Secondary | ICD-10-CM | POA: Diagnosis not present

## 2022-11-27 ENCOUNTER — Ambulatory Visit (INDEPENDENT_AMBULATORY_CARE_PROVIDER_SITE_OTHER): Payer: Medicare Other

## 2022-11-27 DIAGNOSIS — I441 Atrioventricular block, second degree: Secondary | ICD-10-CM

## 2022-11-27 LAB — CUP PACEART REMOTE DEVICE CHECK
Battery Remaining Longevity: 94 mo
Battery Remaining Percentage: 95 %
Battery Voltage: 2.98 V
Brady Statistic AP VP Percent: 56 %
Brady Statistic AP VS Percent: 1 %
Brady Statistic AS VP Percent: 43 %
Brady Statistic AS VS Percent: 1 %
Brady Statistic RA Percent Paced: 55 %
Brady Statistic RV Percent Paced: 99 %
Date Time Interrogation Session: 20231129020013
Implantable Lead Connection Status: 753985
Implantable Lead Connection Status: 753985
Implantable Lead Implant Date: 20230518
Implantable Lead Implant Date: 20230518
Implantable Lead Location: 753859
Implantable Lead Location: 753860
Implantable Pulse Generator Implant Date: 20230518
Lead Channel Impedance Value: 450 Ohm
Lead Channel Impedance Value: 450 Ohm
Lead Channel Pacing Threshold Amplitude: 0.5 V
Lead Channel Pacing Threshold Amplitude: 0.75 V
Lead Channel Pacing Threshold Pulse Width: 0.5 ms
Lead Channel Pacing Threshold Pulse Width: 0.5 ms
Lead Channel Sensing Intrinsic Amplitude: 12 mV
Lead Channel Sensing Intrinsic Amplitude: 3.4 mV
Lead Channel Setting Pacing Amplitude: 2 V
Lead Channel Setting Pacing Amplitude: 2.5 V
Lead Channel Setting Pacing Pulse Width: 0.5 ms
Lead Channel Setting Sensing Sensitivity: 4 mV
Pulse Gen Model: 2272
Pulse Gen Serial Number: 8081973

## 2022-12-03 DIAGNOSIS — M9903 Segmental and somatic dysfunction of lumbar region: Secondary | ICD-10-CM | POA: Diagnosis not present

## 2022-12-03 DIAGNOSIS — M9904 Segmental and somatic dysfunction of sacral region: Secondary | ICD-10-CM | POA: Diagnosis not present

## 2022-12-03 DIAGNOSIS — M9905 Segmental and somatic dysfunction of pelvic region: Secondary | ICD-10-CM | POA: Diagnosis not present

## 2022-12-03 DIAGNOSIS — M5136 Other intervertebral disc degeneration, lumbar region: Secondary | ICD-10-CM | POA: Diagnosis not present

## 2022-12-20 NOTE — Progress Notes (Signed)
Remote pacemaker transmission.   

## 2023-02-03 ENCOUNTER — Telehealth: Payer: Self-pay

## 2023-02-03 DIAGNOSIS — I10 Essential (primary) hypertension: Secondary | ICD-10-CM

## 2023-02-03 MED ORDER — LISINOPRIL-HYDROCHLOROTHIAZIDE 20-25 MG PO TABS: 1.0000 | ORAL_TABLET | Freq: Every day | ORAL | 3 refills | Status: DC

## 2023-02-03 NOTE — Telephone Encounter (Signed)
Per message received, and Dr. Tanna Furry approval to refill script;   Lisinopril /HCTZ printed, signed,  and faxed to [facility] at [phone].

## 2023-02-03 NOTE — Telephone Encounter (Signed)
-----   Message from Jacqulyn Cane, RN sent at 02/03/2023  9:25 AM EST ----- Regarding: RE: med refill Hi. Happy Monday. I am not able to complete this. I included the fax number in the original message that your office has to fax the prescription too.  Sorry for the extra work today. ----- Message ----- From: Varney Daily, RN Sent: 01/31/2023   1:38 PM EST To: Jacqulyn Cane, RN Subject: FW: med refill                                 Good afternoon Ms. Charlett Nose,  Dr. Lovena Le stated is was ok to refill this medication.  Thank you for reaching out.  Merrilee Seashore RN  ----- Message ----- From: Evans Lance, MD Sent: 01/30/2023   2:35 PM EST To: Jacqulyn Cane, RN; Varney Daily, RN Subject: RE: med refill                                 Ok to refill. GT ----- Message ----- From: Jacqulyn Cane, RN Sent: 01/28/2023   9:48 AM EST To: Evans Lance, MD Subject: med refill                                     Hi Dr Lovena Le,  My name is PJ and I am a palliative care nurse with [employer]. Mr. Yasui's pharmacy sent in a refill request for Lisinopril and his former PCP is no longer with the system. She left in November. We have encouraged him to get a new PCP, but until then, he needs refills if you would be so kind. :-)  Medication: Lisinopril/ HCTZ 20/25mg  tablets  [facility] fax [phone]  Thank you so much. PJ Livengood,RN

## 2023-02-03 NOTE — Telephone Encounter (Signed)
-----   Message from Judith Livengood, RN sent at 02/03/2023  9:25 AM EST ----- Regarding: RE: med refill Hi. Happy Monday. I am not able to complete this. I included the fax number in the original message that your office has to fax the prescription too.  Sorry for the extra work today. ----- Message ----- From: Lancer Thurner C, RN Sent: 01/31/2023   1:38 PM EST To: Judith Livengood, RN Subject: FW: med refill                                 Good afternoon Ms. Judith,  Dr. Taylor stated is was ok to refill this medication.  Thank you for reaching out.  Nick RN  ----- Message ----- From: Taylor, Gregg W, MD Sent: 01/30/2023   2:35 PM EST To: Judith Livengood, RN; Kellis Topete C Grabiel Schmutz, RN Subject: RE: med refill                                 Ok to refill. GT ----- Message ----- From: Livengood, Judith, RN Sent: 01/28/2023   9:48 AM EST To: Gregg W Taylor, MD Subject: med refill                                     Hi Dr Taylor,  My name is PJ and I am a palliative care nurse with [employer]. Mr. Scaife's pharmacy sent in a refill request for Lisinopril and his former PCP is no longer with the system. She left in November. We have encouraged him to get a new PCP, but until then, he needs refills if you would be so kind. :-)  Medication: Lisinopril/ HCTZ 20/25mg tablets  [facility] fax #[phone]  Thank you so much. PJ Livengood,RN     

## 2023-02-12 DIAGNOSIS — M9905 Segmental and somatic dysfunction of pelvic region: Secondary | ICD-10-CM | POA: Diagnosis not present

## 2023-02-12 DIAGNOSIS — M9903 Segmental and somatic dysfunction of lumbar region: Secondary | ICD-10-CM | POA: Diagnosis not present

## 2023-02-12 DIAGNOSIS — M9904 Segmental and somatic dysfunction of sacral region: Secondary | ICD-10-CM | POA: Diagnosis not present

## 2023-02-12 DIAGNOSIS — M5136 Other intervertebral disc degeneration, lumbar region: Secondary | ICD-10-CM | POA: Diagnosis not present

## 2023-02-13 ENCOUNTER — Other Ambulatory Visit: Payer: Self-pay | Admitting: *Deleted

## 2023-02-13 DIAGNOSIS — I2693 Single subsegmental pulmonary embolism without acute cor pulmonale: Secondary | ICD-10-CM

## 2023-02-13 MED ORDER — APIXABAN 5 MG PO TABS: 5.0000 mg | ORAL_TABLET | Freq: Two times a day (BID) | ORAL | 1 refills | Status: DC

## 2023-02-13 NOTE — Telephone Encounter (Signed)
Eliquis 40m refill request received. Patient is 77years old, weight-109.2kg, Crea-1.12 on 05/16/22, Diagnosis-PE, and last seen by Dr. TLovena Leon 08/20/22. Dose is appropriate based on dosing criteria. Will send in refill to requested pharmacy.

## 2023-02-26 ENCOUNTER — Ambulatory Visit: Payer: Medicare Other

## 2023-02-26 DIAGNOSIS — I441 Atrioventricular block, second degree: Secondary | ICD-10-CM | POA: Diagnosis not present

## 2023-02-26 LAB — CUP PACEART REMOTE DEVICE CHECK
Battery Remaining Longevity: 91 mo
Battery Remaining Percentage: 92 %
Battery Voltage: 2.98 V
Brady Statistic AP VP Percent: 59 %
Brady Statistic AP VS Percent: 1 %
Brady Statistic AS VP Percent: 40 %
Brady Statistic AS VS Percent: 1 %
Brady Statistic RA Percent Paced: 58 %
Brady Statistic RV Percent Paced: 99 %
Date Time Interrogation Session: 20240228022218
Implantable Lead Connection Status: 753985
Implantable Lead Connection Status: 753985
Implantable Lead Implant Date: 20230518
Implantable Lead Implant Date: 20230518
Implantable Lead Location: 753859
Implantable Lead Location: 753860
Implantable Pulse Generator Implant Date: 20230518
Lead Channel Impedance Value: 450 Ohm
Lead Channel Impedance Value: 450 Ohm
Lead Channel Pacing Threshold Amplitude: 0.5 V
Lead Channel Pacing Threshold Amplitude: 0.75 V
Lead Channel Pacing Threshold Pulse Width: 0.5 ms
Lead Channel Pacing Threshold Pulse Width: 0.5 ms
Lead Channel Sensing Intrinsic Amplitude: 12 mV
Lead Channel Sensing Intrinsic Amplitude: 3.3 mV
Lead Channel Setting Pacing Amplitude: 2 V
Lead Channel Setting Pacing Amplitude: 2.5 V
Lead Channel Setting Pacing Pulse Width: 0.5 ms
Lead Channel Setting Sensing Sensitivity: 4 mV
Pulse Gen Model: 2272
Pulse Gen Serial Number: 8081973

## 2023-03-13 DIAGNOSIS — M9905 Segmental and somatic dysfunction of pelvic region: Secondary | ICD-10-CM | POA: Diagnosis not present

## 2023-03-13 DIAGNOSIS — M9904 Segmental and somatic dysfunction of sacral region: Secondary | ICD-10-CM | POA: Diagnosis not present

## 2023-03-13 DIAGNOSIS — M9903 Segmental and somatic dysfunction of lumbar region: Secondary | ICD-10-CM | POA: Diagnosis not present

## 2023-03-13 DIAGNOSIS — M5136 Other intervertebral disc degeneration, lumbar region: Secondary | ICD-10-CM | POA: Diagnosis not present

## 2023-03-14 ENCOUNTER — Encounter: Payer: Self-pay | Admitting: Family

## 2023-03-14 ENCOUNTER — Ambulatory Visit (INDEPENDENT_AMBULATORY_CARE_PROVIDER_SITE_OTHER): Payer: Medicare Other | Admitting: Family

## 2023-03-14 VITALS — BP 118/70 | HR 62 | Temp 97.5°F | Resp 14 | Ht 70.0 in | Wt 254.0 lb

## 2023-03-14 DIAGNOSIS — E785 Hyperlipidemia, unspecified: Secondary | ICD-10-CM

## 2023-03-14 DIAGNOSIS — R3915 Urgency of urination: Secondary | ICD-10-CM | POA: Diagnosis not present

## 2023-03-14 DIAGNOSIS — I10 Essential (primary) hypertension: Secondary | ICD-10-CM | POA: Diagnosis not present

## 2023-03-14 MED ORDER — LISINOPRIL-HYDROCHLOROTHIAZIDE 20-25 MG PO TABS: 1.0000 | ORAL_TABLET | Freq: Every day | ORAL | 3 refills | Status: DC

## 2023-03-14 NOTE — Progress Notes (Unsigned)
Provider: Marlowe Sax FNP-C   Rye Decoste, Nelda Bucks, NP  Patient Care Team: Jove Beyl, Nelda Bucks, NP as PCP - General (Family Medicine) Lavonna Monarch, MD (Inactive) as Consulting Physician (Dermatology)  Extended Emergency Contact Information Primary Emergency Contact: [place] Phone: [phone] Relation: Other Secondary Emergency Contact: Davis,Scott  [place] Phone: [phone] Relation: Other  Code Status:  Full Code  Goals of care: Advanced Directive information    05/16/2022   10:02 AM  Advanced Directives  Does Patient Have a Medical Advance Directive? Yes  Type of Advance Directive Living will  Does patient want to make changes to medical advance directive? No - Patient declined     Chief Complaint  Patient presents with   Medical Management of Chronic Issues    Leaving on road trip for 1 days and he will be out of lisinopril/hctz while on trip. He needs an rx sent to local pharmacy so he will have enough    HPI:  Pt is a 77 y.o. male seen today for medical management of chronic diseases.   Concerns about having blood sperm. Not exercising or riding his bike.Has gained weight.states watches the TV a lot. States still griving his friend who died in 2023-12-11 had been friend for 60 yrs.   Past Medical History:  Diagnosis Date   Atrial septal defect    corrected in 1962   Cataracts, bilateral    "early stages"   Encounter for care of pacemaker 05/19/2022   Hypercholesteremia    Hypertension    Sees Dr. 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 - [id number]  05/19/2022   Rheumatic fever    at 77 years old   Second degree Mobitz II AV block 05/19/2022   Shoulder pain    left, sees chiropracter for    Stroke ([place]) 03/14/2008   pt states that he may have had a mild stroke 1  week after knee surgery   Past Surgical History:  Procedure Laterality Date   APPENDECTOMY  1979   ASD REPAIR     ASD REPAIR     CATARACT EXTRACTION Left    May 2017   CATARACT EXTRACTION Right    May 2017   KNEE SURGERY     4 surgeries on right knee   PACEMAKER IMPLANT N/A 05/16/2022   Procedure: PACEMAKER IMPLANT;  Surgeon: Evans Lance, MD;  Location: [facility];  Service: Cardiovascular;  Laterality: N/A;   plantar fascititis     bilateral surgery    TONSILLECTOMY  1954   and removal of adnoids   TOTAL KNEE ARTHROPLASTY  2009   right knee, Dr.Randal    TOTAL KNEE ARTHROPLASTY  01/15/2013   Procedure: TOTAL KNEE ARTHROPLASTY;  Surgeon: Yvette Rack., MD;  Location: [address];  Service: Orthopedics;  Laterality: Left;  left total knee arthroplasty    No Known Allergies  Allergies as of 03/14/2023   No Known Allergies      Medication List        Accurate as of March 14, 2023 10:03 AM. If you have any questions, ask your nurse or doctor.          allopurinol 100 MG tablet Commonly known as: ZYLOPRIM Take 1 tablet (100 mg  total) by mouth daily.   apixaban 5 MG Tabs tablet Commonly known as: ELIQUIS Take 1 tablet (5 mg total) by mouth 2 (two) times daily.   aspirin 81 MG tablet Take 1 tablet (81 mg total) by mouth daily.   atorvastatin 40 MG tablet Commonly known as: LIPITOR Take 1 tablet (40 mg total) by mouth daily.   colchicine 0.6 MG tablet Take 1 tablet (0.6 mg total) by mouth daily. As needed for gout flare ups   lisinopril-hydrochlorothiazide 20-25 MG tablet Commonly known as: ZESTORETIC Take 1 tablet by mouth daily.   omega-3 acid ethyl esters 1 g capsule Commonly known as: LOVAZA TAKE 1 CAPSULE BY MOUTH THREE TIMES DAILY        Review of Systems  Constitutional:  Negative for appetite change, chills, fatigue, fever and unexpected weight change.  HENT:  Negative for congestion, dental problem, ear discharge, ear pain, facial  swelling, hearing loss, nosebleeds, postnasal drip, rhinorrhea, sinus pressure, sinus pain, sneezing, sore throat, tinnitus and trouble swallowing.   Eyes:  Negative for pain, discharge, redness, itching and visual disturbance.  Respiratory:  Negative for cough, chest tightness, shortness of breath and wheezing.   Cardiovascular:  Negative for chest pain, palpitations and leg swelling.  Gastrointestinal:  Negative for abdominal distention, abdominal pain, blood in stool, constipation, diarrhea, nausea and vomiting.  Endocrine: Negative for cold intolerance, heat intolerance, polydipsia, polyphagia and polyuria.  Genitourinary:  Positive for urgency. Negative for difficulty urinating, dysuria, flank pain and frequency.  Musculoskeletal:  Negative for arthralgias, back pain, gait problem, joint swelling, myalgias, neck pain and neck stiffness.  Skin:  Negative for color change, pallor, rash and wound.  Neurological:  Negative for dizziness, syncope, speech difficulty, weakness, light-headedness, numbness and headaches.  Hematological:  Does not bruise/bleed easily.  Psychiatric/Behavioral:  Negative for agitation, behavioral problems, confusion, hallucinations, self-injury, sleep disturbance and suicidal ideas. The patient is not nervous/anxious.     Immunization History  Administered Date(s) Administered   Fluad Quad(high Dose 65+) 09/24/2019, 11/20/2020   Influenza Whole 08/29/2011, 12/03/2012   Influenza, High Dose Seasonal PF 09/29/2017, 10/26/2018   Influenza,inj,Quad PF,6+ Mos 12/09/2013, 09/02/2014, 09/08/2015, 11/25/2016   Influenza-Unspecified 09/13/2022   Moderna Sars-Covid-2 Vaccination 03/06/2020, 04/07/2020, 11/03/2020   Pneumococcal Conjugate-13 01/06/2015   Pneumococcal Polysaccharide-23 02/27/2012   Tdap 02/27/2012   Zoster, Live 05/28/2012   Pertinent  Health Maintenance Due  Topic Date Due   INFLUENZA VACCINE  Completed   COLONOSCOPY (Pts 45-40yrs Insurance coverage will  need to be confirmed)  Discontinued      05/15/2020    8:47 AM 10/27/2020   10:36 AM 11/20/2020    8:34 AM 05/16/2022   10:01 AM 03/14/2023    9:25 AM  Fall Risk  Falls in the past year? 0 1 1  0  Was there an injury with Fall? 0 0 0  0  Fall Risk Category Calculator 0 1 1  0  Fall Risk Category (Retired) Low Low Low    (RETIRED) Patient Fall Risk Level Low fall risk Low fall risk Low fall risk Low fall risk   Patient at Risk for Falls Due to     No Fall Risks  Fall risk Follow up     Falls evaluation completed;Education provided;Falls prevention discussed   Functional Status Survey:    Vitals:   03/14/23 0928  BP: 118/70  Pulse: 62  Resp: 14  Temp: (!) 97.5 F (36.4 C)  TempSrc: Temporal  SpO2: 94%  Weight: 254 lb (  115.2 kg)  Height: 5\' 10"  (1.778 m)   Body mass index is 36.45 kg/m. Physical Exam  Labs reviewed: Recent Labs    05/16/22 0945  NA 138  K 3.9  CL 105  CO2 27  GLUCOSE 92  BUN 25*  CREATININE 1.12  CALCIUM 9.3   No results for input(s): "AST", "ALT", "ALKPHOS", "BILITOT", "PROT", "ALBUMIN" in the last 8760 hours. Recent Labs    05/16/22 0945  WBC 7.3  HGB 13.9  HCT 40.9  MCV 85.2  PLT 206   Lab Results  Component Value Date   TSH 2.125 ***Test methodology is 3rd generation TSH*** 11/14/2009   Lab Results  Component Value Date   HGBA1C 5.5 05/16/2021   Lab Results  Component Value Date   CHOL 115 05/16/2021   HDL 33 (L) 05/16/2021   LDLCALC 66 05/16/2021   TRIG 81 05/16/2021   CHOLHDL 3.5 05/16/2021    Significant Diagnostic Results in last 30 days:  CUP PACEART REMOTE DEVICE CHECK  Result Date: 02/26/2023 Scheduled remote reviewed. Normal device function.  1 AMS, 8sec in duration Next remote 91 days. LA   Assessment/Plan There are no diagnoses linked to this encounter.   Family/ staff Communication: Reviewed plan of care with patient  Labs/tests ordered: None   Next Appointment :   Sandrea Hughs, NP

## 2023-03-15 LAB — CBC WITH DIFFERENTIAL/PLATELET
Absolute Monocytes: 451 cells/uL (ref 200–950)
Basophils Absolute: 43 cells/uL (ref 0–200)
Basophils Relative: 0.7 %
Eosinophils Absolute: 122 cells/uL (ref 15–500)
Eosinophils Relative: 2 %
HCT: 41.7 % (ref 38.5–50.0)
Hemoglobin: 14 g/dL (ref 13.2–17.1)
Lymphs Abs: 1958 cells/uL (ref 850–3900)
MCH: 29.6 pg (ref 27.0–33.0)
MCHC: 33.6 g/dL (ref 32.0–36.0)
MCV: 88.2 fL (ref 80.0–100.0)
MPV: 11 fL (ref 7.5–12.5)
Monocytes Relative: 7.4 %
Neutro Abs: 3526 cells/uL (ref 1500–7800)
Neutrophils Relative %: 57.8 %
Platelets: 194 10*3/uL (ref 140–400)
RBC: 4.73 10*6/uL (ref 4.20–5.80)
RDW: 13.6 % (ref 11.0–15.0)
Total Lymphocyte: 32.1 %
WBC: 6.1 10*3/uL (ref 3.8–10.8)

## 2023-03-15 LAB — COMPLETE METABOLIC PANEL WITH GFR
AG Ratio: 1.6 (calc) (ref 1.0–2.5)
ALT: 22 U/L (ref 9–46)
AST: 24 U/L (ref 10–35)
Albumin: 4.3 g/dL (ref 3.6–5.1)
Alkaline phosphatase (APISO): 70 U/L (ref 35–144)
BUN: 23 mg/dL (ref 7–25)
CO2: 29 mmol/L (ref 20–32)
Calcium: 9.3 mg/dL (ref 8.6–10.3)
Chloride: 103 mmol/L (ref 98–110)
Creat: 1.11 mg/dL (ref 0.70–1.28)
Globulin: 2.7 g/dL (calc) (ref 1.9–3.7)
Glucose, Bld: 86 mg/dL (ref 65–99)
Potassium: 4.1 mmol/L (ref 3.5–5.3)
Sodium: 140 mmol/L (ref 135–146)
Total Bilirubin: 0.8 mg/dL (ref 0.2–1.2)
Total Protein: 7 g/dL (ref 6.1–8.1)
eGFR: 68 mL/min/{1.73_m2} (ref >=60)

## 2023-03-15 LAB — LIPID PANEL
Cholesterol: 121 mg/dL (ref <200)
HDL: 38 mg/dL — ABNORMAL LOW (ref >=40)
LDL Cholesterol (Calc): 67 mg/dL (calc)
Non-HDL Cholesterol (Calc): 83 mg/dL (calc) (ref <130)
Total CHOL/HDL Ratio: 3.2 (calc) (ref <5.0)
Triglycerides: 79 mg/dL (ref <150)

## 2023-04-01 NOTE — Progress Notes (Signed)
Remote pacemaker transmission.   

## 2023-04-16 DIAGNOSIS — R361 Hematospermia: Secondary | ICD-10-CM | POA: Diagnosis not present

## 2023-04-16 DIAGNOSIS — R350 Frequency of micturition: Secondary | ICD-10-CM | POA: Diagnosis not present

## 2023-04-16 DIAGNOSIS — R3915 Urgency of urination: Secondary | ICD-10-CM | POA: Diagnosis not present

## 2023-05-22 DIAGNOSIS — M5136 Other intervertebral disc degeneration, lumbar region: Secondary | ICD-10-CM | POA: Diagnosis not present

## 2023-05-22 DIAGNOSIS — M9903 Segmental and somatic dysfunction of lumbar region: Secondary | ICD-10-CM | POA: Diagnosis not present

## 2023-05-22 DIAGNOSIS — M9905 Segmental and somatic dysfunction of pelvic region: Secondary | ICD-10-CM | POA: Diagnosis not present

## 2023-05-22 DIAGNOSIS — M9904 Segmental and somatic dysfunction of sacral region: Secondary | ICD-10-CM | POA: Diagnosis not present

## 2023-05-28 ENCOUNTER — Ambulatory Visit (INDEPENDENT_AMBULATORY_CARE_PROVIDER_SITE_OTHER): Payer: Medicare Other

## 2023-05-28 DIAGNOSIS — I441 Atrioventricular block, second degree: Secondary | ICD-10-CM

## 2023-06-03 LAB — CUP PACEART REMOTE DEVICE CHECK
Battery Remaining Longevity: 88 mo
Battery Remaining Percentage: 89 %
Battery Voltage: 2.98 V
Brady Statistic AP VP Percent: 59 %
Brady Statistic AP VS Percent: 1 %
Brady Statistic AS VP Percent: 40 %
Brady Statistic AS VS Percent: 1 %
Brady Statistic RA Percent Paced: 59 %
Brady Statistic RV Percent Paced: 99 %
Date Time Interrogation Session: 20240603220835
Implantable Lead Connection Status: 753985
Implantable Lead Connection Status: 753985
Implantable Lead Implant Date: 20230518
Implantable Lead Implant Date: 20230518
Implantable Lead Location: 753859
Implantable Lead Location: 753860
Implantable Pulse Generator Implant Date: 20230518
Lead Channel Impedance Value: 450 Ohm
Lead Channel Impedance Value: 460 Ohm
Lead Channel Pacing Threshold Amplitude: 0.5 V
Lead Channel Pacing Threshold Amplitude: 0.75 V
Lead Channel Pacing Threshold Pulse Width: 0.5 ms
Lead Channel Pacing Threshold Pulse Width: 0.5 ms
Lead Channel Sensing Intrinsic Amplitude: 3.6 mV
Lead Channel Sensing Intrinsic Amplitude: 9.6 mV
Lead Channel Setting Pacing Amplitude: 2 V
Lead Channel Setting Pacing Amplitude: 2.5 V
Lead Channel Setting Pacing Pulse Width: 0.5 ms
Lead Channel Setting Sensing Sensitivity: 4 mV
Pulse Gen Model: 2272
Pulse Gen Serial Number: 8081973

## 2023-06-04 ENCOUNTER — Encounter: Payer: Medicare Other | Admitting: Family

## 2023-06-04 NOTE — Progress Notes (Signed)
  This encounter was created in error - please disregard. No show 

## 2023-06-06 DIAGNOSIS — R361 Hematospermia: Secondary | ICD-10-CM | POA: Diagnosis not present

## 2023-06-06 DIAGNOSIS — R3915 Urgency of urination: Secondary | ICD-10-CM | POA: Diagnosis not present

## 2023-06-19 NOTE — Progress Notes (Signed)
Remote pacemaker transmission.   

## 2023-08-27 ENCOUNTER — Ambulatory Visit (INDEPENDENT_AMBULATORY_CARE_PROVIDER_SITE_OTHER): Payer: Medicare Other

## 2023-08-27 DIAGNOSIS — I441 Atrioventricular block, second degree: Secondary | ICD-10-CM

## 2023-08-27 LAB — CUP PACEART REMOTE DEVICE CHECK
Battery Remaining Longevity: 83 mo
Battery Remaining Percentage: 86 %
Battery Voltage: 2.98 V
Brady Statistic AP VP Percent: 62 %
Brady Statistic AP VS Percent: 1 %
Brady Statistic AS VP Percent: 37 %
Brady Statistic AS VS Percent: 1 %
Brady Statistic RA Percent Paced: 61 %
Brady Statistic RV Percent Paced: 99 %
Date Time Interrogation Session: 20240828020016
Implantable Lead Connection Status: 753985
Implantable Lead Connection Status: 753985
Implantable Lead Implant Date: 20230518
Implantable Lead Implant Date: 20230518
Implantable Lead Location: 753859
Implantable Lead Location: 753860
Implantable Pulse Generator Implant Date: 20230518
Lead Channel Impedance Value: 430 Ohm
Lead Channel Impedance Value: 430 Ohm
Lead Channel Pacing Threshold Amplitude: 0.5 V
Lead Channel Pacing Threshold Amplitude: 0.75 V
Lead Channel Pacing Threshold Pulse Width: 0.5 ms
Lead Channel Pacing Threshold Pulse Width: 0.5 ms
Lead Channel Sensing Intrinsic Amplitude: 12 mV
Lead Channel Sensing Intrinsic Amplitude: 3 mV
Lead Channel Setting Pacing Amplitude: 2 V
Lead Channel Setting Pacing Amplitude: 2.5 V
Lead Channel Setting Pacing Pulse Width: 0.5 ms
Lead Channel Setting Sensing Sensitivity: 4 mV
Pulse Gen Model: 2272
Pulse Gen Serial Number: 8081973

## 2023-09-02 ENCOUNTER — Other Ambulatory Visit: Payer: Self-pay

## 2023-09-02 DIAGNOSIS — E785 Hyperlipidemia, unspecified: Secondary | ICD-10-CM

## 2023-09-02 MED ORDER — ATORVASTATIN CALCIUM 40 MG PO TABS
40.0000 mg | ORAL_TABLET | Freq: Every day | ORAL | 3 refills | Status: DC
Start: 2023-09-02 — End: 2024-07-13

## 2023-09-04 ENCOUNTER — Other Ambulatory Visit: Payer: Self-pay | Admitting: Family

## 2023-09-04 DIAGNOSIS — E785 Hyperlipidemia, unspecified: Secondary | ICD-10-CM

## 2023-09-04 DIAGNOSIS — I10 Essential (primary) hypertension: Secondary | ICD-10-CM

## 2023-09-04 NOTE — Progress Notes (Signed)
Remote pacemaker transmission.   

## 2023-09-10 ENCOUNTER — Other Ambulatory Visit: Payer: Medicare Other

## 2023-09-10 DIAGNOSIS — Z8739 Personal history of other diseases of the musculoskeletal system and connective tissue: Secondary | ICD-10-CM | POA: Diagnosis not present

## 2023-09-10 DIAGNOSIS — E785 Hyperlipidemia, unspecified: Secondary | ICD-10-CM | POA: Diagnosis not present

## 2023-09-10 DIAGNOSIS — I10 Essential (primary) hypertension: Secondary | ICD-10-CM

## 2023-09-15 ENCOUNTER — Ambulatory Visit (INDEPENDENT_AMBULATORY_CARE_PROVIDER_SITE_OTHER): Payer: Medicare Other | Admitting: Nurse Practitioner

## 2023-09-15 ENCOUNTER — Encounter: Payer: Self-pay | Admitting: Nurse Practitioner

## 2023-09-15 VITALS — BP 130/84 | HR 73 | Temp 97.3°F | Resp 16 | Ht 70.0 in | Wt 248.0 lb

## 2023-09-15 DIAGNOSIS — E785 Hyperlipidemia, unspecified: Secondary | ICD-10-CM

## 2023-09-15 DIAGNOSIS — I441 Atrioventricular block, second degree: Secondary | ICD-10-CM

## 2023-09-15 DIAGNOSIS — Z23 Encounter for immunization: Secondary | ICD-10-CM

## 2023-09-15 DIAGNOSIS — Z950 Presence of cardiac pacemaker: Secondary | ICD-10-CM | POA: Diagnosis not present

## 2023-09-15 DIAGNOSIS — Z8739 Personal history of other diseases of the musculoskeletal system and connective tissue: Secondary | ICD-10-CM

## 2023-09-15 DIAGNOSIS — I10 Essential (primary) hypertension: Secondary | ICD-10-CM | POA: Diagnosis not present

## 2023-09-15 DIAGNOSIS — R3915 Urgency of urination: Secondary | ICD-10-CM

## 2023-09-15 LAB — COMPLETE METABOLIC PANEL WITHOUT GFR
AG Ratio: 1.6 (calc) (ref 1.0–2.5)
ALT: 14 U/L (ref 9–46)
AST: 17 U/L (ref 10–35)
Albumin: 4.2 g/dL (ref 3.6–5.1)
Alkaline phosphatase (APISO): 65 U/L (ref 35–144)
BUN: 25 mg/dL (ref 7–25)
CO2: 30 mmol/L (ref 20–32)
Calcium: 9.3 mg/dL (ref 8.6–10.3)
Chloride: 102 mmol/L (ref 98–110)
Creat: 1.05 mg/dL (ref 0.70–1.28)
Globulin: 2.6 g/dL (ref 1.9–3.7)
Glucose, Bld: 90 mg/dL (ref 65–99)
Potassium: 4.2 mmol/L (ref 3.5–5.3)
Sodium: 139 mmol/L (ref 135–146)
Total Bilirubin: 0.6 mg/dL (ref 0.2–1.2)
Total Protein: 6.8 g/dL (ref 6.1–8.1)
eGFR: 73 mL/min/1.73m2

## 2023-09-15 LAB — TSH: TSH: 1.66 m[IU]/L (ref 0.40–4.50)

## 2023-09-15 LAB — CBC WITH DIFFERENTIAL/PLATELET
Absolute Monocytes: 433 {cells}/uL (ref 200–950)
Basophils Absolute: 40 {cells}/uL (ref 0–200)
Basophils Relative: 0.7 %
Eosinophils Absolute: 103 {cells}/uL (ref 15–500)
Eosinophils Relative: 1.8 %
HCT: 41 % (ref 38.5–50.0)
Hemoglobin: 13.8 g/dL (ref 13.2–17.1)
Lymphs Abs: 1670 {cells}/uL (ref 850–3900)
MCH: 29.8 pg (ref 27.0–33.0)
MCHC: 33.7 g/dL (ref 32.0–36.0)
MCV: 88.6 fL (ref 80.0–100.0)
MPV: 10.9 fL (ref 7.5–12.5)
Monocytes Relative: 7.6 %
Neutro Abs: 3454 {cells}/uL (ref 1500–7800)
Neutrophils Relative %: 60.6 %
Platelets: 186 10*3/uL (ref 140–400)
RBC: 4.63 10*6/uL (ref 4.20–5.80)
RDW: 13.2 % (ref 11.0–15.0)
Total Lymphocyte: 29.3 %
WBC: 5.7 10*3/uL (ref 3.8–10.8)

## 2023-09-15 LAB — LIPID PANEL
Cholesterol: 124 mg/dL
HDL: 33 mg/dL — ABNORMAL LOW
LDL Cholesterol (Calc): 72 mg/dL
Non-HDL Cholesterol (Calc): 91 mg/dL
Total CHOL/HDL Ratio: 3.8 (calc)
Triglycerides: 105 mg/dL

## 2023-09-15 LAB — TEST AUTHORIZATION

## 2023-09-15 LAB — URIC ACID: Uric Acid, Serum: 8.4 mg/dL — ABNORMAL HIGH (ref 4.0–8.0)

## 2023-09-15 NOTE — Patient Instructions (Addendum)
  Do not have to take ASA since you are on eliquis.   To call and make appt with cardiologist  Team Member Role and Specialty Contact Info Address Start End Comments  Marinus Maw, MD Consulting Physician (Cardiology) Phone: [phone] Fax: [phone] Email: [email or web address] [address] [place] [address] 09/15/2023 - -   To get shingles, COVID and TDAP at your local pharmacy  Increase exercise Decrease sugar

## 2023-09-15 NOTE — Progress Notes (Signed)
Careteam: Patient Care Team: Sharon Seller, NP as PCP - General (Geriatric Medicine) Janalyn Harder, MD (Inactive) as Consulting Physician (Dermatology) Marinus Maw, MD as Consulting Physician (Cardiology)  PLACE OF SERVICE:  [facility]  Advanced Directive information Does Patient Have a Medical Advance Directive?: Yes, Type of Advance Directive: Healthcare Power of [place];Living will, Does patient want to make changes to medical advance directive?: Yes (Inpatient - patient defers changing a medical advance directive at this time - Information given)  No Known Allergies  Chief Complaint  Patient presents with   Follow-up    3 month follow up     HPI: Patient is a 77 y.o. male presents for 28-month follow-up. Previously seeing Dinah but had a hard time hearing her due to accent and requesting to follow up with myself.  Previously was seeing Dr Renato Gails.    States he has gained 7 lbs since June. 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 He only exercises about 15 minutes a day in the morning. Not eating as healthy as he should. Supposed to wear dentures but he only uses them when he goes out in public, not with eating. States he mainly eats soft foods and doesn't have a problem eating.   Has pacemaker. Sees cardiology, states he has appointment soon. On the Eliquis. Denies blood in urine and stool, states he bruises easily but no blood. There was concern about blood in his semen 6 months ago but he saw urologist and was given some medication that he doesn't remember the name of to help his urge to urinate, but caused some constipation so he stopped taking it. Wondering if he needs to continue aspirin. Denies shortness of breath, chest pain.  Hgb has been stable.   Sees the chiropractor every 3-6 months and stays "tuned up". Denies any  abnormal body aches/pains.    Review of Systems:  Review of Systems  Constitutional:  Negative for chills, fever, malaise/fatigue and weight loss.  Respiratory:  Negative for cough and shortness of breath.   Cardiovascular:  Negative for chest pain and palpitations.  Gastrointestinal:  Negative for constipation, diarrhea, nausea and vomiting.  Genitourinary:  Positive for frequency and urgency. Negative for dysuria.  Musculoskeletal:  Negative for myalgias.       Slight discomfort in left shoulder  Neurological:  Negative for dizziness, weakness and headaches.  Psychiatric/Behavioral:  Negative for depression. The patient is not nervous/anxious and does not have insomnia.     Past Medical History:  Diagnosis Date   Atrial septal defect    corrected in 1962   Cataracts, bilateral    "early stages"   Encounter for care of pacemaker 05/19/2022   Hypercholesteremia    Hypertension    Sees Dr. 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 - [id number]  05/19/2022   Rheumatic fever    at 77 years old   Second degree Mobitz II AV block 05/19/2022   Shoulder pain    left, sees chiropracter for    Stroke ([facility]) 03/14/2008   pt states that he may have had a mild stroke 1 week after knee surgery   Past Surgical History:  Procedure  Laterality Date   APPENDECTOMY  1979   ASD REPAIR     ASD REPAIR     CATARACT EXTRACTION Left    May 2017   CATARACT EXTRACTION Right    May 2017   KNEE SURGERY     4 surgeries on right knee   PACEMAKER IMPLANT N/A 05/16/2022   Procedure: PACEMAKER IMPLANT;  Surgeon: Marinus Maw, MD;  Location: [facility];  Service: Cardiovascular;  Laterality: N/A;   plantar fascititis     bilateral surgery    TONSILLECTOMY  1954   and removal of adnoids   TOTAL KNEE ARTHROPLASTY  2009   right knee, Dr.Randal    TOTAL KNEE  ARTHROPLASTY  01/15/2013   Procedure: TOTAL KNEE ARTHROPLASTY;  Surgeon: Thera Flake., MD;  Location: [facility];  Service: Orthopedics;  Laterality: Left;  left total knee arthroplasty   Social History:   reports that he has never smoked. He has never used smokeless tobacco. He reports current alcohol use of about 2.0 standard drinks of alcohol per week. He reports that he does not use drugs.  Family History  Adopted: Yes  Problem Relation Age of Onset   Heart attack Father    Pancreatic cancer Brother     Medications: Patient's Medications  New Prescriptions   No medications on file  Previous Medications   ALLOPURINOL (ZYLOPRIM) 100 MG TABLET    Take 1 tablet (100 mg total) by mouth daily.   APIXABAN (ELIQUIS) 5 MG TABS TABLET    Take 1 tablet (5 mg total) by mouth 2 (two) times daily.   ASPIRIN 81 MG TABLET    Take 1 tablet (81 mg total) by mouth daily.   ATORVASTATIN (LIPITOR) 40 MG TABLET    Take 1 tablet (40 mg total) by mouth daily.   COLCHICINE 0.6 MG TABLET    Take 1 tablet (0.6 mg total) by mouth daily. As needed for gout flare ups   LISINOPRIL-HYDROCHLOROTHIAZIDE (ZESTORETIC) 20-25 MG TABLET    Take 1 tablet by mouth daily.   OMEGA-3 ACID ETHYL ESTERS (LOVAZA) 1 G CAPSULE    TAKE 1 CAPSULE BY MOUTH THREE TIMES DAILY  Modified Medications   No medications on file  Discontinued Medications   No medications on file    Physical Exam:  Vitals:   09/15/23 0822  BP: 130/84  Pulse: 73  Resp: 16  Temp: (!) 97.3 F (36.3 C)  SpO2: 98%  Weight: 112.5 kg  Height: 5\' 10"  (1.778 m)   Body mass index is 35.58 kg/m. Wt Readings from Last 3 Encounters:  09/15/23 112.5 kg  03/14/23 115.2 kg  08/20/22 109.2 kg    Physical Exam Constitutional:      Appearance: Normal appearance. He is obese.  HENT:     Ears:     Comments: hard of hearing; wearing hearing aids    Mouth/Throat:     Mouth: Mucous membranes are moist.     Pharynx: Oropharynx is clear.     Comments: teeth  missing; not wearing dentures Cardiovascular:     Rate and Rhythm: Normal rate and regular rhythm.     Pulses: Normal pulses.     Heart sounds: Normal heart sounds.  Pulmonary:     Effort: Pulmonary effort is normal.     Breath sounds: Normal breath sounds.  Abdominal:     General: Bowel sounds are normal.     Palpations: Abdomen is soft.  Musculoskeletal:     Right  lower leg: Edema present.     Left lower leg: Edema present.  Skin:    General: Skin is warm and dry.  Neurological:     General: No focal deficit present.     Mental Status: He is alert and oriented to person, place, and time. Mental status is at baseline.  Psychiatric:        Mood and Affect: Mood normal.        Behavior: Behavior normal.     Labs reviewed: Basic Metabolic Panel: Recent Labs    03/14/23 1030 09/10/23 0816  NA 140 139  K 4.1 4.2  CL 103 102  CO2 29 30  GLUCOSE 86 90  BUN 23 25  CREATININE 1.11 1.05  CALCIUM 9.3 9.3  TSH  --  1.66   Liver Function Tests: Recent Labs    03/14/23 1030 09/10/23 0816  AST 24 17  ALT 22 14  BILITOT 0.8 0.6  PROT 7.0 6.8   No results for input(s): "LIPASE", "AMYLASE" in the last 8760 hours. No results for input(s): "AMMONIA" in the last 8760 hours. CBC: Recent Labs    03/14/23 1030 09/10/23 0816  WBC 6.1 5.7  NEUTROABS 3,526 3,454  HGB 14.0 13.8  HCT 41.7 41.0  MCV 88.2 88.6  PLT 194 186   Lipid Panel: Recent Labs    03/14/23 1030 09/10/23 0816  CHOL 121 124  HDL 38* 33*  LDLCALC 67 72  TRIG 79 105  CHOLHDL 3.2 3.8   TSH: Recent Labs    09/10/23 0816  TSH 1.66   A1C: Lab Results  Component Value Date   HGBA1C 5.5 05/16/2021     Assessment/Plan 1. Essential hypertension, benign -Labs reviewed with patient -Controlled; continue lisinopril-hydrochlorothiazide  Continue current regimen with dietary modifications   2. Hyperlipidemia LDL goal <100l  - At goal; continue atorvastatin - Encouraged physical activity and  dietary modifications  3. Second degree Mobitz II AV block -Pacemaker -Followed by cardiology, reviewed appointments which is not scheduled; advised patient to call to make appointment  4. Pacemaker Dual chamber Abbott Assurity Dr-Rf - [id number]  -History of second degree Mobitz II AV block -Insertion date 05/16/2022 Followed by cardiology   5. Urgency of urination -States he was seen by urology, Not on medication due to side effects, continue life style modifications.   6. History of gout -Continue allopurinol and colchicine; patient taking PRN -- it's been a long time since he's needed it -Uric acid added.   7. Need for influenza vaccination - Flu Vaccine Trivalent High Dose (Fluad)  8. Morbid (severe) obesity due to excess calories (HCC) -BMI >35; comorbidities present including htn, hyperlipidemia.  - Encouraged physical activity and dietary modifications; decrease sugar intake -Lipid panel - CBC with Differential/Platelet - COMPLETE METABOLIC PANEL WITH GFR -TSH   Return in about 6 months (around 03/14/2024) for routine follow up, labs before .  Rollen Sox, FNP-MSN Student -I personally was present during the history, physical exam and medical decision-making activities of this service and have verified that the service and findings are accurately documented in the student's note  Abbey Chatters, NP

## 2023-10-01 DIAGNOSIS — H5203 Hypermetropia, bilateral: Secondary | ICD-10-CM | POA: Diagnosis not present

## 2023-10-01 DIAGNOSIS — Z0100 Encounter for examination of eyes and vision without abnormal findings: Secondary | ICD-10-CM | POA: Diagnosis not present

## 2023-10-09 ENCOUNTER — Telehealth: Payer: Self-pay | Admitting: Internal Medicine

## 2023-10-09 ENCOUNTER — Other Ambulatory Visit: Payer: Self-pay

## 2023-10-09 DIAGNOSIS — I2693 Single subsegmental pulmonary embolism without acute cor pulmonale: Secondary | ICD-10-CM

## 2023-10-09 MED ORDER — APIXABAN 5 MG PO TABS
5.0000 mg | ORAL_TABLET | Freq: Two times a day (BID) | ORAL | 1 refills | Status: DC
Start: 2023-10-09 — End: 2023-10-09

## 2023-10-09 MED ORDER — APIXABAN 5 MG PO TABS: 5.0000 mg | ORAL_TABLET | Freq: Two times a day (BID) | ORAL | 1 refills | Status: DC

## 2023-10-09 NOTE — Telephone Encounter (Signed)
Prescription refill request for Eliquis received. Indication: PE Last office visit: 08/20/22 Ladona Ridgel) Scr: 1.05 (09/10/23)  Age: 77 Weight: 112.5kg  Office visit overdue. Pt has scheduled appt with Dr Ladona Ridgel on Nov 6. Appropriate dose. Refill sent.

## 2023-10-09 NOTE — Telephone Encounter (Signed)
Eliquis 5mg  refill request received. Patient is 77 years old, weight-112.5kg, Crea-1.05 on 09/10/23, Diagnosis-PE, and last seen by Dr. Ladona Ridgel on 08/20/22 & pending appt on 11/05/23. Dose is appropriate based on dosing criteria. Will send in refill to requested pharmacy.  Pt was taken off off eliquis then resumed on 08/20/22 per Dr. Lubertha Basque note.

## 2023-10-09 NOTE — Telephone Encounter (Signed)
New message    *STAT* If patient is at the pharmacy, call can be transferred to refill team.   1. Which medications need to be refilled? (please list name of each medication and dose if known) eliquis 5mg    2. Would you like to learn more about the convenience, safety, & potential cost savings by using the [facility]? Not at this time     3. Are you open to using the [facility] (Type [facility].  ).   4. Which pharmacy/location (including street and city if local pharmacy) is medication to be sent to?[facility] [address]   5. Do they need a 30 day or 90 day supply? 90   (pt is out of medication and have appt on nov 6 with Dr Ladona Ridgel)

## 2023-10-17 ENCOUNTER — Ambulatory Visit (INDEPENDENT_AMBULATORY_CARE_PROVIDER_SITE_OTHER): Payer: Medicare Other | Admitting: Nurse Practitioner

## 2023-10-17 ENCOUNTER — Encounter: Payer: Self-pay | Admitting: Nurse Practitioner

## 2023-10-17 VITALS — BP 130/80 | HR 64 | Temp 97.4°F | Resp 20 | Ht 70.0 in | Wt 251.8 lb

## 2023-10-17 DIAGNOSIS — Z0000 Encounter for general adult medical examination without abnormal findings: Secondary | ICD-10-CM | POA: Diagnosis not present

## 2023-10-17 NOTE — Patient Instructions (Addendum)
  [name] , Thank you for taking time to come for your Medicare Wellness Visit. I appreciate your ongoing commitment to your health goals. Please review the following plan we discussed and let me know if I can assist you in the future.    This is a list of the screening recommended for you and due dates:  Health Maintenance  Topic Date Due   Zoster (Shingles) Vaccine (1 of 2) 12/05/1965   DTaP/Tdap/Td vaccine (2 - Td or Tdap) 02/26/2022   COVID-19 Vaccine (4 - 2023-24 season) 08/31/2023   Medicare Annual Wellness Visit  10/16/2024   Pneumonia Vaccine  Completed   Flu Shot  Completed   Hepatitis C Screening  Completed   HPV Vaccine  Aged Out   Colon Cancer Screening  Discontinued   TDAP, covid booster and SHINGLES series to be done at local pharmacy

## 2023-10-17 NOTE — Progress Notes (Signed)
Subjective:   Dean Fry is a 77 y.o. male who presents for Medicare Annual/Subsequent preventive examination.  Visit Complete: In person at [facility]   Cardiac Risk Factors include: advanced age (>22men, >33 women);hypertension;male gender     Objective:    Today's Vitals   10/17/23 0851  BP: 130/80  Pulse: 64  Resp: 20  Temp: (!) 97.4 F (36.3 C)  SpO2: 97%  Weight: 251 lb 12.8 oz (114.2 kg)  Height: 5\' 10"  (1.778 m)   Body mass index is 36.13 kg/m.     10/17/2023    8:56 AM 09/15/2023    8:21 AM 05/16/2022   10:02 AM 10/27/2020   10:43 AM 05/15/2020    8:47 AM 11/15/2019   10:47 AM 10/25/2019    8:27 AM  Advanced Directives  Does Patient Have a Medical Advance Directive? Yes Yes Yes Yes Yes Yes Yes  Type of Advance Directive Living will;Out of facility DNR (pink MOST or yellow form) Healthcare Power of [place];Living will Living will Out of facility DNR (pink MOST or yellow form) Out of facility DNR (pink MOST or yellow form) Out of facility DNR (pink MOST or yellow form) Out of facility DNR (pink MOST or yellow form)  Does patient want to make changes to medical advance directive? No - Patient declined Yes (Inpatient - patient defers changing a medical advance directive at this time - Information given) No - Patient declined No - Patient declined No - Patient declined No - Patient declined No - Patient declined  Copy of Healthcare Power of Attorney in Chart? No - copy requested Yes - validated most recent copy scanned in chart (See row information)       Pre-existing out of facility DNR order (yellow form or pink MOST form) Yellow form placed in chart (order not valid for inpatient use)     Pink Most/Yellow Form available - Physician notified to receive inpatient order     Current Medications (verified) Outpatient Encounter Medications as of 10/17/2023  Medication Sig   allopurinol (ZYLOPRIM) 100 MG tablet Take 1 tablet (100 mg total) by mouth daily.   apixaban  (ELIQUIS) 5 MG TABS tablet Take 1 tablet (5 mg total) by mouth 2 (two) times daily.   aspirin 81 MG tablet Take 1 tablet (81 mg total) by mouth daily.   atorvastatin (LIPITOR) 40 MG tablet Take 1 tablet (40 mg total) by mouth daily.   lisinopril-hydrochlorothiazide (ZESTORETIC) 20-25 MG tablet Take 1 tablet by mouth daily.   omega-3 acid ethyl esters (LOVAZA) 1 g capsule TAKE 1 CAPSULE BY MOUTH THREE TIMES DAILY   No facility-administered encounter medications on file as of 10/17/2023.    Allergies (verified) Patient has no known allergies.   History: Past Medical History:  Diagnosis Date   Atrial septal defect    corrected in 1962   Cataracts, bilateral    "early stages"   Encounter for care of pacemaker 05/19/2022   Hypercholesteremia    Hypertension    Sees Dr. 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 - [id number]  05/19/2022   Rheumatic fever    at 77 years old   Second degree Mobitz II AV block 05/19/2022   Shoulder pain    left, sees chiropracter for    Stroke (HCC) 03/14/2008  pt states that he may have had a mild stroke 1 week after knee surgery   Past Surgical History:  Procedure Laterality Date   APPENDECTOMY  1979   ASD REPAIR     ASD REPAIR     CATARACT EXTRACTION Left    May 2017   CATARACT EXTRACTION Right    May 2017   KNEE SURGERY     4 surgeries on right knee   PACEMAKER IMPLANT N/A 05/16/2022   Procedure: PACEMAKER IMPLANT;  Surgeon: Marinus Maw, MD;  Location: [facility];  Service: Cardiovascular;  Laterality: N/A;   plantar fascititis     bilateral surgery    TONSILLECTOMY  1954   and removal of adnoids   TOTAL KNEE ARTHROPLASTY  2009   right knee, Dr.Randal    TOTAL KNEE ARTHROPLASTY  01/15/2013   Procedure: TOTAL KNEE ARTHROPLASTY;  Surgeon: Thera Flake., MD;  Location: [facility] OR;  Service: Orthopedics;   Laterality: Left;  left total knee arthroplasty   Family History  Adopted: Yes  Problem Relation Age of Onset   Heart attack Father    Pancreatic cancer Brother    Social History   Socioeconomic History   Marital status: Divorced    Spouse name: Not on file   Number of children: 0   Years of education: Not on file   Highest education level: Not on file  Occupational History   Not on file  Tobacco Use   Smoking status: Never   Smokeless tobacco: Never  Vaping Use   Vaping status: Never Used  Substance and Sexual Activity   Alcohol use: Yes    Alcohol/week: 2.0 standard drinks of alcohol    Types: 2 Standard drinks or equivalent per week    Comment: occasional use   Drug use: No   Sexual activity: Not Currently  Other Topics Concern   Not on file  Social History Narrative   Not on file   Social Determinants of Health   Financial Resource Strain: Low Risk  (10/19/2018)   Overall Financial Resource Strain (CARDIA)    Difficulty of Paying Living Expenses: Not hard at all  Food Insecurity: No Food Insecurity (10/19/2018)   Hunger Vital Sign    Worried About Running Out of Food in the Last Year: Never true    Ran Out of Food in the Last Year: Never true  Transportation Needs: No Transportation Needs (10/19/2018)   PRAPARE - Administrator, Civil Service (Medical): No    Lack of Transportation (Non-Medical): No  Physical Activity: Sufficiently Active (10/19/2018)   Exercise Vital Sign    Days of Exercise per Week: 7 days    Minutes of Exercise per Session: 30 min  Stress: No Stress Concern Present (10/19/2018)   [employer] of Occupational Health - Occupational Stress Questionnaire    Feeling of Stress : Not at all  Social Connections: Socially Isolated (10/19/2018)   Social Connection and Isolation Panel [NHANES]    Frequency of Communication with Friends and Family: Never    Frequency of Social Gatherings with Friends and Family: Twice a week     Attends Religious Services: Never    Database administrator or Organizations: No    Attends Engineer, structural: Never    Marital Status: Divorced    Tobacco Counseling Counseling given: Not Answered   Clinical Intake:  Pre-visit preparation completed: Yes  Pain : No/denies pain     BMI - recorded:  36.13 Nutritional Status: BMI > 30  Obese Nutritional Risks: None Diabetes: No  How often do you need to have someone help you when you read instructions, pamphlets, or other written materials from your doctor or pharmacy?: 1 - Never What is the last grade level you completed in school?: COLLEGE  Interpreter Needed?: No  Information entered by :: [facility] WEAVER CMA   Activities of Daily Living    10/17/2023    8:48 AM 10/13/2023    8:48 AM  In your present state of health, do you have any difficulty performing the following activities:  Hearing? 1 1  Vision? 0 0  Difficulty concentrating or making decisions? 0 0  Walking or climbing stairs? 0 0  Dressing or bathing? 0 0  Doing errands, shopping? 0 0  Preparing Food and eating ? N N  Using the Toilet? N N  In the past six months, have you accidently leaked urine? Y Y  Do you have problems with loss of bowel control? N N  Managing your Medications? N N  Managing your Finances? N N  Housekeeping or managing your Housekeeping? N N    Patient Care Team: Sharon Seller, NP as PCP - General (Geriatric Medicine) Janalyn Harder, MD (Inactive) as Consulting Physician (Dermatology) Marinus Maw, MD as Consulting Physician (Cardiology)  Indicate any recent Medical Services you may have received from other than [facility] providers in the past year (date may be approximate).     Assessment:   This is a routine wellness examination for Dean Fry.  Hearing/Vision screen Hearing Screening - Comments:: Wear hearing aids and reports hearing declined  Vision Screening - Comments:: No vision concerns/ wear glasses    Goals Addressed   None    Depression Screen    10/17/2023    8:58 AM 09/15/2023    8:21 AM 03/14/2023    9:25 AM 11/20/2020    8:35 AM 10/27/2020   10:37 AM 01/26/2020   11:00 AM 11/15/2019   10:45 AM  PHQ 2/9 Scores  PHQ - 2 Score 0 0 0 0 0 0 0    Fall Risk    10/17/2023    8:58 AM 10/13/2023    8:48 AM 09/15/2023    8:21 AM 03/14/2023    9:25 AM 11/20/2020    8:34 AM  Fall Risk   Falls in the past year? 1 0 1 0 1  Number falls in past yr: 0 0  0 0  Injury with Fall? 0 0  0 0  Risk for fall due to : History of fall(s)   No Fall Risks   Follow up Falls evaluation completed   Falls evaluation completed;Education provided;Falls prevention discussed     MEDICARE RISK AT HOME: Medicare Risk at Home Any stairs in or around the home?: (P) Yes If so, are there any without handrails?: (P) No Home free of loose throw rugs in walkways, pet beds, electrical cords, etc?: (P) Yes Adequate lighting in your home to reduce risk of falls?: (P) Yes Life alert?: (P) No Use of a cane, walker or w/c?: (P) No Grab bars in the bathroom?: (P) Yes Shower chair or bench in shower?: (P) No Elevated toilet seat or a handicapped toilet?: (P) No  TIMED UP AND GO:  Was the test performed?  No    Cognitive Function:    10/17/2023    9:03 AM 10/25/2019    8:32 AM 04/23/2018    2:04 PM 03/21/2017    9:00  AM 09/08/2015    8:41 AM  MMSE - Mini Mental State Exam  Orientation to time 5 5 5 5 5   Orientation to Place 5 5 5 5 5   Registration 3 3 3 3 3   Attention/ Calculation 5 5 5 3 3   Recall 3 3 3 3 2   Language- name 2 objects 2 2 2 2 2   Language- repeat 1 1 1 1 1   Language- follow 3 step command 3 3 3 3 3   Language- read & follow direction 1 1 1 1 1   Write a sentence 1 1 1 1 1   Copy design 1 1 1 1 1   Total score 30 30 30 28 27         10/27/2020   10:38 AM  6CIT Screen  What Year? 0 points  What month? 0 points  What time? 0 points  Count back from 20 0 points  Months in reverse 0  points  Repeat phrase 0 points  Total Score 0 points    Immunizations Immunization History  Administered Date(s) Administered   Fluad Quad(high Dose 65+) 09/24/2019, 11/20/2020   Fluad Trivalent(High Dose 65+) 09/15/2023   Influenza Whole 08/29/2011, 12/03/2012   Influenza, High Dose Seasonal PF 09/29/2017, 10/26/2018   Influenza,inj,Quad PF,6+ Mos 12/09/2013, 09/02/2014, 09/08/2015, 11/25/2016   Influenza-Unspecified 09/13/2022   Moderna Sars-Covid-2 Vaccination 03/06/2020, 04/07/2020, 11/03/2020   Pneumococcal Conjugate-13 01/06/2015   Pneumococcal Polysaccharide-23 02/27/2012   Tdap 02/27/2012   Zoster, Live 05/28/2012    TDAP status: Due, Education has been provided regarding the importance of this vaccine. Advised may receive this vaccine at local pharmacy or Health Dept. Aware to provide a copy of the vaccination record if obtained from local pharmacy or Health Dept. Verbalized acceptance and understanding.  Flu Vaccine status: Up to date  Pneumococcal vaccine status: Up to date  Covid-19 vaccine status: Information provided on how to obtain vaccines.   Qualifies for Shingles Vaccine? Yes   Zostavax completed No   Shingrix Completed?: No.    Education has been provided regarding the importance of this vaccine. Patient has been advised to call insurance company to determine out of pocket expense if they have not yet received this vaccine. Advised may also receive vaccine at local pharmacy or Health Dept. Verbalized acceptance and understanding.  Screening Tests Health Maintenance  Topic Date Due   Zoster Vaccines- Shingrix (1 of 2) 12/05/1964   DTaP/Tdap/Td (2 - Td or Tdap) 02/26/2022   COVID-19 Vaccine (4 - 2023-24 season) 08/31/2023   Medicare Annual Wellness (AWV)  10/16/2024   Pneumonia Vaccine 82+ Years old  Completed   INFLUENZA VACCINE  Completed   Hepatitis C Screening  Completed   HPV VACCINES  Aged Out   Colonoscopy  Discontinued    Health  Maintenance  Health Maintenance Due  Topic Date Due   Zoster Vaccines- Shingrix (1 of 2) 12/05/1964   DTaP/Tdap/Td (2 - Td or Tdap) 02/26/2022   COVID-19 Vaccine (4 - 2023-24 season) 08/31/2023    Colorectal cancer screening: No longer required.   Lung Cancer Screening: (Low Dose CT Chest recommended if Age 69-80 years, 20 pack-year currently smoking OR have quit w/in 15years.) does not qualify.   Lung Cancer Screening Referral: na  Additional Screening:  Hepatitis C Screening: does qualify; Completed   Vision Screening: Recommended annual ophthalmology exams for early detection of glaucoma and other disorders of the eye. Is the patient up to date with their annual eye exam?  Yes  Who is the  provider or what is the name of the office in which the patient attends annual eye exams? [facility] If pt is not established with a provider, would they like to be referred to a provider to establish care? No .   Dental Screening: Recommended annual dental exams for proper oral hygiene    Community Resource Referral / Chronic Care Management: CRR required this visit?  No   CCM required this visit?  No     Plan:     I have personally reviewed and noted the following in the patient's chart:   Medical and social history Use of alcohol, tobacco or illicit drugs  Current medications and supplements including opioid prescriptions. Patient is not currently taking opioid prescriptions. Functional ability and status Nutritional status Physical activity Advanced directives List of other physicians Hospitalizations, surgeries, and ER visits in previous 12 months Vitals Screenings to include cognitive, depression, and falls Referrals and appointments  In addition, I have reviewed and discussed with patient certain preventive protocols, quality metrics, and best practice recommendations. A written personalized care plan for preventive services as well as general preventive health  recommendations were provided to patient.     Sharon Seller, NP   10/17/2023

## 2023-10-22 ENCOUNTER — Ambulatory Visit: Payer: Medicare Other | Attending: Internal Medicine | Admitting: Internal Medicine

## 2023-10-22 ENCOUNTER — Encounter: Payer: Self-pay | Admitting: Internal Medicine

## 2023-10-22 VITALS — BP 128/72 | HR 65 | Ht 70.0 in | Wt 249.4 lb

## 2023-10-22 DIAGNOSIS — I441 Atrioventricular block, second degree: Secondary | ICD-10-CM

## 2023-10-22 DIAGNOSIS — I2693 Single subsegmental pulmonary embolism without acute cor pulmonale: Secondary | ICD-10-CM | POA: Diagnosis not present

## 2023-10-22 NOTE — Patient Instructions (Signed)

## 2023-10-22 NOTE — Progress Notes (Signed)
HPI Dean Fry returns today for PPM followup. He is a pleasant 77 yo man with a h/o CHB, s/p PPM insertion about 15 months ago. In the interim he has done well. He has a remote h/o PE. He has a remote thoracotomy for an ASD repair.     No Known Allergies   Current Outpatient Medications  Medication Sig Dispense Refill   allopurinol (ZYLOPRIM) 100 MG tablet Take 1 tablet (100 mg total) by mouth daily. 90 tablet 3   apixaban (ELIQUIS) 5 MG TABS tablet Take 1 tablet (5 mg total) by mouth 2 (two) times daily. 180 tablet 1   aspirin 81 MG tablet Take 1 tablet (81 mg total) by mouth daily. 30 tablet    atorvastatin (LIPITOR) 40 MG tablet Take 1 tablet (40 mg total) by mouth daily. 90 tablet 3   lisinopril-hydrochlorothiazide (ZESTORETIC) 20-25 MG tablet Take 1 tablet by mouth daily. 90 tablet 3   omega-3 acid ethyl esters (LOVAZA) 1 g capsule TAKE 1 CAPSULE BY MOUTH THREE TIMES DAILY 270 capsule 1   solifenacin (VESICARE) 10 MG tablet Take 10 mg by mouth daily. (Patient not taking: Reported on 10/22/2023)     No current facility-administered medications for this visit.     Past Medical History:  Diagnosis Date   Atrial septal defect    corrected in 1962   Cataracts, bilateral    "early stages"   Encounter for care of pacemaker 05/19/2022   Hypercholesteremia    Hypertension    Sees Dr. 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   Past Surgical History:  Procedure Laterality Date   APPENDECTOMY  1979   ASD REPAIR      ASD REPAIR     CATARACT EXTRACTION Left    May 2017   CATARACT EXTRACTION Right    May 2017   KNEE SURGERY     4 surgeries on right knee   PACEMAKER IMPLANT N/A 05/16/2022   Procedure: PACEMAKER IMPLANT;  Surgeon: Marinus Maw, MD;  Location: [facility];  Service: Cardiovascular;  Laterality: N/A;   plantar fascititis     bilateral surgery    TONSILLECTOMY  1954   and removal of adnoids   TOTAL KNEE ARTHROPLASTY  2009   right knee, Dr.Randal    TOTAL KNEE ARTHROPLASTY  01/15/2013   Procedure: TOTAL KNEE ARTHROPLASTY;  Surgeon: Thera Flake., MD;  Location: [facility] OR;  Service: Orthopedics;  Laterality: Left;  left total knee arthroplasty     Family History  Adopted: Yes  Problem Relation Age of Onset   Heart attack Father    Pancreatic cancer Brother      Social History   Socioeconomic History   Marital status: Divorced    Spouse name: Not on file   Number of children: 0   Years of education:  Not on file   Highest education level: Not on file  Occupational History   Not on file  Tobacco Use   Smoking status: Never   Smokeless tobacco: Never  Vaping Use   Vaping status: Never Used  Substance and Sexual Activity   Alcohol use: Yes    Alcohol/week: 2.0 standard drinks of alcohol    Types: 2 Standard drinks or equivalent per week    Comment: occasional use   Drug use: No   Sexual activity: Not Currently  Other Topics Concern   Not on file  Social History Narrative   Not on file   Social Determinants of Health   Financial Resource Strain: Low Risk  (10/19/2018)   Overall Financial Resource Strain (CARDIA)    Difficulty of Paying Living Expenses: Not hard at all  Food Insecurity: No Food Insecurity (10/19/2018)   Hunger Vital Sign    Worried About Running Out of Food in the Last Year: Never true    Ran Out of Food in the Last Year: Never true  Transportation Needs: No Transportation Needs (10/19/2018)   PRAPARE - Scientist, research (physical sciences) (Medical): No    Lack of Transportation (Non-Medical): No  Physical Activity: Sufficiently Active (10/19/2018)   Exercise Vital Sign    Days of Exercise per Week: 7 days    Minutes of Exercise per Session: 30 min  Stress: No Stress Concern Present (10/19/2018)   [employer] of Occupational Health - Occupational Stress Questionnaire    Feeling of Stress : Not at all  Social Connections: Socially Isolated (10/19/2018)   Social Connection and Isolation Panel [NHANES]    Frequency of Communication with Friends and Family: Never    Frequency of Social Gatherings with Friends and Family: Twice a week    Attends Religious Services: Never    Database administrator or Organizations: No    Attends Banker Meetings: Never    Marital Status: Divorced  Catering manager Violence: Not At Risk (10/19/2018)   Humiliation, Afraid, Rape, and Kick questionnaire    Fear of Current or Ex-Partner: No    Emotionally Abused: No    Physically Abused: No    Sexually Abused: No     BP 128/72   Pulse 65   Ht 5\' 10"  (1.778 m)   Wt 249 lb 6.4 oz (113.1 kg)   SpO2 97%   BMI 35.79 kg/m   Physical Exam:  Well appearing NAD HEENT: Unremarkable Neck:  No JVD, no thyromegally Lymphatics:  No adenopathy Back:  No CVA tenderness Lungs:  Clear HEART:  Regular rate rhythm, no murmurs, no rubs, no clicks Abd:  soft, positive bowel sounds, no organomegally, no rebound, no guarding Ext:  2 plus pulses, no edema, no cyanosis, no clubbing Skin:  No rashes no nodules Neuro:  CN II through XII intact, motor grossly intact  EKG - AV sequental pacing  DEVICE  Normal device function.  See PaceArt for details.   Assess/Plan:  Heart block - he is stable s/p PPM insertion and is asymptomatic. Dyspnea - he has class 2 symptoms. I encouraged him to start walking.  Pulmonary embolism - he remains on eliquis.  Continue.   4. PAF - he is maintaining NSR over 99% of the time.  Continue systemic anti-coagulation.    Dean Gowda Sharronda Schweers,MD

## 2023-10-22 NOTE — Progress Notes (Signed)
EKG

## 2023-10-30 LAB — CUP PACEART INCLINIC DEVICE CHECK
Battery Remaining Longevity: 80 mo
Battery Voltage: 2.98 V
Brady Statistic RA Percent Paced: 62 %
Brady Statistic RV Percent Paced: 99.38 %
Date Time Interrogation Session: 20241023130125
Implantable Lead Connection Status: 753985
Implantable Lead Connection Status: 753985
Implantable Lead Implant Date: 20230518
Implantable Lead Implant Date: 20230518
Implantable Lead Location: 753859
Implantable Lead Location: 753860
Implantable Pulse Generator Implant Date: 20230518
Lead Channel Impedance Value: 450 Ohm
Lead Channel Impedance Value: 462.5 Ohm
Lead Channel Pacing Threshold Amplitude: 0.75 V
Lead Channel Pacing Threshold Amplitude: 0.75 V
Lead Channel Pacing Threshold Amplitude: 0.75 V
Lead Channel Pacing Threshold Amplitude: 0.75 V
Lead Channel Pacing Threshold Pulse Width: 0.5 ms
Lead Channel Pacing Threshold Pulse Width: 0.5 ms
Lead Channel Pacing Threshold Pulse Width: 0.5 ms
Lead Channel Pacing Threshold Pulse Width: 0.5 ms
Lead Channel Sensing Intrinsic Amplitude: 12 mV
Lead Channel Sensing Intrinsic Amplitude: 2.7 mV
Lead Channel Setting Pacing Amplitude: 2 V
Lead Channel Setting Pacing Amplitude: 2.5 V
Lead Channel Setting Pacing Pulse Width: 0.5 ms
Lead Channel Setting Sensing Sensitivity: 4 mV
Pulse Gen Model: 2272
Pulse Gen Serial Number: 8081973

## 2023-11-05 ENCOUNTER — Encounter: Payer: Medicare Other | Admitting: Internal Medicine

## 2023-11-26 ENCOUNTER — Ambulatory Visit (INDEPENDENT_AMBULATORY_CARE_PROVIDER_SITE_OTHER): Payer: Medicare Other

## 2023-11-26 DIAGNOSIS — I441 Atrioventricular block, second degree: Secondary | ICD-10-CM

## 2023-11-26 LAB — CUP PACEART REMOTE DEVICE CHECK
Battery Remaining Longevity: 82 mo
Battery Remaining Percentage: 83 %
Battery Voltage: 2.98 V
Brady Statistic AP VP Percent: 79 %
Brady Statistic AP VS Percent: 1 %
Brady Statistic AS VP Percent: 21 %
Brady Statistic AS VS Percent: 1 %
Brady Statistic RA Percent Paced: 78 %
Brady Statistic RV Percent Paced: 99 %
Date Time Interrogation Session: 20241127020014
Implantable Lead Connection Status: 753985
Implantable Lead Connection Status: 753985
Implantable Lead Implant Date: 20230518
Implantable Lead Implant Date: 20230518
Implantable Lead Location: 753859
Implantable Lead Location: 753860
Implantable Pulse Generator Implant Date: 20230518
Lead Channel Impedance Value: 450 Ohm
Lead Channel Impedance Value: 450 Ohm
Lead Channel Pacing Threshold Amplitude: 0.75 V
Lead Channel Pacing Threshold Amplitude: 0.75 V
Lead Channel Pacing Threshold Pulse Width: 0.5 ms
Lead Channel Pacing Threshold Pulse Width: 0.5 ms
Lead Channel Sensing Intrinsic Amplitude: 12 mV
Lead Channel Sensing Intrinsic Amplitude: 2.6 mV
Lead Channel Setting Pacing Amplitude: 2 V
Lead Channel Setting Pacing Amplitude: 2.5 V
Lead Channel Setting Pacing Pulse Width: 0.5 ms
Lead Channel Setting Sensing Sensitivity: 4 mV
Pulse Gen Model: 2272
Pulse Gen Serial Number: 8081973

## 2023-12-16 DIAGNOSIS — M9905 Segmental and somatic dysfunction of pelvic region: Secondary | ICD-10-CM | POA: Diagnosis not present

## 2023-12-16 DIAGNOSIS — M5136 Other intervertebral disc degeneration, lumbar region with discogenic back pain only: Secondary | ICD-10-CM | POA: Diagnosis not present

## 2023-12-16 DIAGNOSIS — M9904 Segmental and somatic dysfunction of sacral region: Secondary | ICD-10-CM | POA: Diagnosis not present

## 2023-12-16 DIAGNOSIS — M9903 Segmental and somatic dysfunction of lumbar region: Secondary | ICD-10-CM | POA: Diagnosis not present

## 2023-12-30 NOTE — Progress Notes (Signed)
Remote pacemaker transmission.   

## 2024-01-21 ENCOUNTER — Other Ambulatory Visit: Payer: Self-pay

## 2024-01-21 DIAGNOSIS — E785 Hyperlipidemia, unspecified: Secondary | ICD-10-CM

## 2024-01-21 NOTE — Telephone Encounter (Signed)
High risk or very high risk warning populated when attempting to refill medication. RX request sent to PCP for review and approval if warranted.

## 2024-01-22 MED ORDER — OMEGA-3-ACID ETHYL ESTERS 1 G PO CAPS: 1.0000 | ORAL_CAPSULE | Freq: Three times a day (TID) | ORAL | 3 refills | Status: AC

## 2024-02-25 ENCOUNTER — Ambulatory Visit (INDEPENDENT_AMBULATORY_CARE_PROVIDER_SITE_OTHER): Payer: Medicare Other

## 2024-02-25 DIAGNOSIS — I441 Atrioventricular block, second degree: Secondary | ICD-10-CM | POA: Diagnosis not present

## 2024-02-27 LAB — CUP PACEART REMOTE DEVICE CHECK
Battery Remaining Longevity: 77 mo
Battery Remaining Percentage: 80 %
Battery Voltage: 2.98 V
Brady Statistic AP VP Percent: 71 %
Brady Statistic AP VS Percent: 1 %
Brady Statistic AS VP Percent: 29 %
Brady Statistic AS VS Percent: 1 %
Brady Statistic RA Percent Paced: 71 %
Brady Statistic RV Percent Paced: 99 %
Date Time Interrogation Session: 20250228011055
Implantable Lead Connection Status: 753985
Implantable Lead Connection Status: 753985
Implantable Lead Implant Date: 20230518
Implantable Lead Implant Date: 20230518
Implantable Lead Location: 753859
Implantable Lead Location: 753860
Implantable Pulse Generator Implant Date: 20230518
Lead Channel Impedance Value: 430 Ohm
Lead Channel Impedance Value: 430 Ohm
Lead Channel Pacing Threshold Amplitude: 0.75 V
Lead Channel Pacing Threshold Amplitude: 0.75 V
Lead Channel Pacing Threshold Pulse Width: 0.5 ms
Lead Channel Pacing Threshold Pulse Width: 0.5 ms
Lead Channel Sensing Intrinsic Amplitude: 12 mV
Lead Channel Sensing Intrinsic Amplitude: 3 mV
Lead Channel Setting Pacing Amplitude: 2 V
Lead Channel Setting Pacing Amplitude: 2.5 V
Lead Channel Setting Pacing Pulse Width: 0.5 ms
Lead Channel Setting Sensing Sensitivity: 4 mV
Pulse Gen Model: 2272
Pulse Gen Serial Number: 8081973

## 2024-02-29 ENCOUNTER — Encounter: Payer: Self-pay | Admitting: Internal Medicine

## 2024-03-05 ENCOUNTER — Other Ambulatory Visit: Payer: Self-pay

## 2024-03-05 DIAGNOSIS — I10 Essential (primary) hypertension: Secondary | ICD-10-CM

## 2024-03-05 MED ORDER — LISINOPRIL-HYDROCHLOROTHIAZIDE 20-25 MG PO TABS: 1.0000 | ORAL_TABLET | Freq: Every day | ORAL | 2 refills | Status: DC

## 2024-03-10 ENCOUNTER — Other Ambulatory Visit: Payer: Medicare Other

## 2024-03-10 DIAGNOSIS — I10 Essential (primary) hypertension: Secondary | ICD-10-CM

## 2024-03-10 DIAGNOSIS — M9903 Segmental and somatic dysfunction of lumbar region: Secondary | ICD-10-CM | POA: Diagnosis not present

## 2024-03-10 DIAGNOSIS — E785 Hyperlipidemia, unspecified: Secondary | ICD-10-CM

## 2024-03-10 DIAGNOSIS — M5136 Other intervertebral disc degeneration, lumbar region with discogenic back pain only: Secondary | ICD-10-CM | POA: Diagnosis not present

## 2024-03-10 DIAGNOSIS — M9905 Segmental and somatic dysfunction of pelvic region: Secondary | ICD-10-CM | POA: Diagnosis not present

## 2024-03-10 DIAGNOSIS — M9904 Segmental and somatic dysfunction of sacral region: Secondary | ICD-10-CM | POA: Diagnosis not present

## 2024-03-11 LAB — CBC WITH DIFFERENTIAL/PLATELET
Absolute Lymphocytes: 1712 {cells}/uL (ref 850–3900)
Absolute Monocytes: 356 {cells}/uL (ref 200–950)
Basophils Absolute: 32 {cells}/uL (ref 0–200)
Basophils Relative: 0.6 %
Eosinophils Absolute: 130 {cells}/uL (ref 15–500)
Eosinophils Relative: 2.4 %
HCT: 41.3 % (ref 38.5–50.0)
Hemoglobin: 13.7 g/dL (ref 13.2–17.1)
MCH: 29.6 pg (ref 27.0–33.0)
MCHC: 33.2 g/dL (ref 32.0–36.0)
MCV: 89.2 fL (ref 80.0–100.0)
MPV: 11 fL (ref 7.5–12.5)
Monocytes Relative: 6.6 %
Neutro Abs: 3170 {cells}/uL (ref 1500–7800)
Neutrophils Relative %: 58.7 %
Platelets: 201 10*3/uL (ref 140–400)
RBC: 4.63 10*6/uL (ref 4.20–5.80)
RDW: 13.3 % (ref 11.0–15.0)
Total Lymphocyte: 31.7 %
WBC: 5.4 10*3/uL (ref 3.8–10.8)

## 2024-03-11 LAB — LIPID PANEL
Cholesterol: 113 mg/dL (ref <200)
HDL: 38 mg/dL — ABNORMAL LOW (ref >=40)
LDL Cholesterol (Calc): 58 mg/dL
Non-HDL Cholesterol (Calc): 75 mg/dL (ref <130)
Total CHOL/HDL Ratio: 3 (calc) (ref <5.0)
Triglycerides: 85 mg/dL (ref <150)

## 2024-03-11 LAB — COMPLETE METABOLIC PANEL WITH GFR
AG Ratio: 1.8 (calc) (ref 1.0–2.5)
ALT: 18 U/L (ref 9–46)
AST: 19 U/L (ref 10–35)
Albumin: 4.2 g/dL (ref 3.6–5.1)
Alkaline phosphatase (APISO): 57 U/L (ref 35–144)
BUN: 20 mg/dL (ref 7–25)
CO2: 33 mmol/L — ABNORMAL HIGH (ref 20–32)
Calcium: 9.2 mg/dL (ref 8.6–10.3)
Chloride: 102 mmol/L (ref 98–110)
Creat: 0.95 mg/dL (ref 0.70–1.28)
Globulin: 2.3 g/dL (ref 1.9–3.7)
Glucose, Bld: 90 mg/dL (ref 65–99)
Potassium: 4.1 mmol/L (ref 3.5–5.3)
Sodium: 140 mmol/L (ref 135–146)
Total Bilirubin: 0.5 mg/dL (ref 0.2–1.2)
Total Protein: 6.5 g/dL (ref 6.1–8.1)
eGFR: 82 mL/min/{1.73_m2} (ref >=60)

## 2024-03-12 NOTE — Patient Instructions (Addendum)
 1.) Visit your local pharmacy to receive recommended vaccines, TD/TDap and Shingrix  To take Loratadine or cetrizine (generic for Claritin or zyrtec) 10 mg by mouth daily for allergies.

## 2024-03-15 ENCOUNTER — Encounter: Payer: Self-pay | Admitting: Nurse Practitioner

## 2024-03-15 ENCOUNTER — Ambulatory Visit (INDEPENDENT_AMBULATORY_CARE_PROVIDER_SITE_OTHER): Payer: Medicare Other | Admitting: Nurse Practitioner

## 2024-03-15 ENCOUNTER — Other Ambulatory Visit: Payer: Self-pay | Admitting: *Deleted

## 2024-03-15 VITALS — BP 130/82 | HR 60 | Temp 97.9°F | Ht 70.0 in | Wt 249.2 lb

## 2024-03-15 DIAGNOSIS — J3089 Other allergic rhinitis: Secondary | ICD-10-CM

## 2024-03-15 DIAGNOSIS — E785 Hyperlipidemia, unspecified: Secondary | ICD-10-CM | POA: Diagnosis not present

## 2024-03-15 DIAGNOSIS — M170 Bilateral primary osteoarthritis of knee: Secondary | ICD-10-CM

## 2024-03-15 DIAGNOSIS — I10 Essential (primary) hypertension: Secondary | ICD-10-CM

## 2024-03-15 DIAGNOSIS — M109 Gout, unspecified: Secondary | ICD-10-CM

## 2024-03-15 DIAGNOSIS — I441 Atrioventricular block, second degree: Secondary | ICD-10-CM

## 2024-03-15 DIAGNOSIS — H9190 Unspecified hearing loss, unspecified ear: Secondary | ICD-10-CM

## 2024-03-15 DIAGNOSIS — R3915 Urgency of urination: Secondary | ICD-10-CM | POA: Diagnosis not present

## 2024-03-15 DIAGNOSIS — I2693 Single subsegmental pulmonary embolism without acute cor pulmonale: Secondary | ICD-10-CM

## 2024-03-15 MED ORDER — LISINOPRIL-HYDROCHLOROTHIAZIDE 20-25 MG PO TABS: 1.0000 | ORAL_TABLET | Freq: Every day | ORAL | 3 refills | Status: AC

## 2024-03-15 MED ORDER — APIXABAN 5 MG PO TABS: 5.0000 mg | ORAL_TABLET | Freq: Two times a day (BID) | ORAL | 1 refills | Status: DC

## 2024-03-15 NOTE — Progress Notes (Signed)
 Careteam: Patient Care Team: Sharon Seller, NP as PCP - General (Geriatric Medicine) Janalyn Harder, MD (Inactive) as Consulting Physician (Dermatology) Marinus Maw, MD as Consulting Physician (Cardiology)  PLACE OF SERVICE:  [facility]  Advanced Directive information Does Patient Have a Medical Advance Directive?: Yes, Type of Advance Directive: Out of facility DNR (pink MOST or yellow form), Pre-existing out of facility DNR order (yellow form or pink MOST form): Yellow form placed in chart (order not valid for inpatient use);Pink MOST form placed in chart (order not valid for inpatient use), Does patient want to make changes to medical advance directive?: No - Patient declined  No Known Allergies  Chief Complaint  Patient presents with   Medical Management of Chronic Issues    6 month follow-up. Discussed need for shingrix, td/tdap and covid booster. Discuss vesicare.      HPI: Patient is a 78 y.o. male who comes in today for his 6 month follow up for medical management of chronic issues. He is very hard of hearing and purchased new hearing aids 3 days ago from [employer] over the counter. The left hearing aid is not working and the right hearing aid is not working well. He will go back tomorrow to [employer] to get them checked out. [facility] originally gave him his original set, but they were very expensive and he would like new resources if available.  He is taking all meds as prescribed with the exception of allopurinol which he takes only as needed and not taking solifenacin due to constipation. He does not wish to take any laxatives, mentions his urinary urgency is well managed without medication. He bruises easily due to eliquis. He tripped and fell several months ago and fell on his right knee, denies hitting his head. He states his right knee does not hurt and denies any problems associated with the fall.  He is following up with cardiology for his pacemaker.  He will be going to [place] in April for 5 days. He will let cardiology know about his travel plans.  He has no family but remains socially active by volunteering for various organizations. He does feel like his hearing deficit is making him isolated because he can't carry on and finish conversations at times.   He lost his lower dentures, does not want to get new ones at this time. Denies any issues with chewing food properly.  While recently volunteering making boxes for an organization he notices he had allergy symptoms which resolved once he left the facility.    Review of Systems:  Review of Systems  Constitutional: Negative.   HENT:  Positive for hearing loss.   Eyes: Negative.   Respiratory: Negative.    Cardiovascular:  Positive for leg swelling.  Gastrointestinal: Negative.   Genitourinary:  Positive for urgency.  Musculoskeletal:  Positive for falls.  Skin: Negative.   Neurological: Negative.   Endo/Heme/Allergies:  Positive for environmental allergies. Bruises/bleeds easily.  Psychiatric/Behavioral: Negative.      Past Medical History:  Diagnosis Date   Atrial septal defect    corrected in 1962   Cataracts, bilateral    "early stages"   Encounter for care of pacemaker 05/19/2022   Hypercholesteremia    Hypertension    Sees Dr. Dorris Fetch   Impaired hearing    Impaired vision    glasses   Low back pain    Osteoarthritis of both knees    left knee   Pacemaker Dual chamber Abbott  Assurity Dr-Rf - [id number]  05/19/2022   Rheumatic fever    at 78 years old   Second degree Mobitz II AV block 05/19/2022   Shoulder pain    left, sees chiropracter for    Stroke [facility]) 03/14/2008   pt states that he may have had a mild stroke 1 week after knee surgery   Past Surgical History:  Procedure Laterality Date   APPENDECTOMY  1979   ASD REPAIR     ASD REPAIR     CATARACT EXTRACTION Left    May 2017   CATARACT EXTRACTION Right    May 2017   KNEE SURGERY     4 surgeries  on right knee   PACEMAKER IMPLANT N/A 05/16/2022   Procedure: PACEMAKER IMPLANT;  Surgeon: Marinus Maw, MD;  Location: [facility] INVASIVE [facility];  Service: Cardiovascular;  Laterality: N/A;   plantar fascititis     bilateral surgery    TONSILLECTOMY  1954   and removal of adnoids   TOTAL KNEE ARTHROPLASTY  2009   right knee, Dr.Randal    TOTAL KNEE ARTHROPLASTY  01/15/2013   Procedure: TOTAL KNEE ARTHROPLASTY;  Surgeon: Thera Flake., MD;  Location: [facility] OR;  Service: Orthopedics;  Laterality: Left;  left total knee arthroplasty   Social History:   reports that he has never smoked. He has never used smokeless tobacco. He reports current alcohol use of about 2.0 standard drinks of alcohol per week. He reports that he does not use drugs.  Family History  Adopted: Yes  Problem Relation Age of Onset   Heart attack Father    Pancreatic cancer Brother     Medications: Patient's Medications  New Prescriptions   No medications on file  Previous Medications   ALLOPURINOL (ZYLOPRIM) 100 MG TABLET    Take 1 tablet (100 mg total) by mouth daily.   APIXABAN (ELIQUIS) 5 MG TABS TABLET    Take 1 tablet (5 mg total) by mouth 2 (two) times daily.   ASPIRIN 81 MG TABLET    Take 1 tablet (81 mg total) by mouth daily.   ATORVASTATIN (LIPITOR) 40 MG TABLET    Take 1 tablet (40 mg total) by mouth daily.   OMEGA-3 ACID ETHYL ESTERS (LOVAZA) 1 G CAPSULE    Take 1 capsule (1 g total) by mouth 3 (three) times daily.   SOLIFENACIN (VESICARE) 10 MG TABLET    Take 10 mg by mouth daily.  Modified Medications   Modified Medication Previous Medication   LISINOPRIL-HYDROCHLOROTHIAZIDE (ZESTORETIC) 20-25 MG TABLET lisinopril-hydrochlorothiazide (ZESTORETIC) 20-25 MG tablet      Take 1 tablet by mouth daily.    Take 1 tablet by mouth daily.  Discontinued Medications   No medications on file    Physical Exam:  Vitals:   03/12/24 1654  BP: 130/82  Pulse: 60  Temp: 97.9 F (36.6 C)  TempSrc: Temporal  SpO2:  96%  Weight: 249 lb 3.2 oz (113 kg)  Height: 5\' 10"  (1.778 m)   Body mass index is 35.76 kg/m. Wt Readings from Last 3 Encounters:  03/12/24 249 lb 3.2 oz (113 kg)  10/22/23 249 lb 6.4 oz (113.1 kg)  10/17/23 251 lb 12.8 oz (114.2 kg)    Physical Exam Vitals reviewed.  Constitutional:      Appearance: Normal appearance. He is obese.  HENT:     Head: Normocephalic and atraumatic.     Right Ear: External ear normal.     Left Ear: Tympanic membrane  and external ear normal.     Nose: Nose normal.     Mouth/Throat:     Mouth: Mucous membranes are moist.     Pharynx: Oropharynx is clear.  Eyes:     Conjunctiva/sclera: Conjunctivae normal.  Cardiovascular:     Rate and Rhythm: Normal rate and regular rhythm.     Pulses: Normal pulses.     Heart sounds: Normal heart sounds.  Pulmonary:     Effort: Pulmonary effort is normal.     Breath sounds: Normal breath sounds.  Abdominal:     General: Bowel sounds are normal.     Palpations: Abdomen is soft.  Musculoskeletal:     Cervical back: Neck supple.     Right lower leg: Edema present.     Left lower leg: Edema present.  Skin:    General: Skin is warm and dry.  Neurological:     General: No focal deficit present.     Mental Status: He is alert and oriented to person, place, and time. Mental status is at baseline.  Psychiatric:        Mood and Affect: Mood normal.        Behavior: Behavior normal.        Thought Content: Thought content normal.     Labs reviewed: Basic Metabolic Panel: Recent Labs    09/10/23 0816 03/10/24 0825  NA 139 140  K 4.2 4.1  CL 102 102  CO2 30 33*  GLUCOSE 90 90  BUN 25 20  CREATININE 1.05 0.95  CALCIUM 9.3 9.2  TSH 1.66  --    Liver Function Tests: Recent Labs    09/10/23 0816 03/10/24 0825  AST 17 19  ALT 14 18  BILITOT 0.6 0.5  PROT 6.8 6.5   No results for input(s): "LIPASE", "AMYLASE" in the last 8760 hours. No results for input(s): "AMMONIA" in the last 8760  hours. CBC: Recent Labs    09/10/23 0816 03/10/24 0825  WBC 5.7 5.4  NEUTROABS 3,454 3,170  HGB 13.8 13.7  HCT 41.0 41.3  MCV 88.6 89.2  PLT 186 201   Lipid Panel: Recent Labs    09/10/23 0816 03/10/24 0825  CHOL 124 113  HDL 33* 38*  LDLCALC 72 58  TRIG 105 85  CHOLHDL 3.8 3.0   TSH: Recent Labs    09/10/23 0816  TSH 1.66   A1C: Lab Results  Component Value Date   HGBA1C 5.5 05/16/2021     Assessment/Plan 1. Hyperlipidemia LDL goal <100 (Primary) - Stable - Lipid panel March 2025 within range, HDL slightly low 38 - Continue atorvastatin 40 mg tablet as prescribed - Discussed increasing physical activity  - Recheck fasting lipid panel in 6 months  2. Essential hypertension, benign - Stable - BP 130/82 - Continue lisinopril-hydrochlorothiazide (ZESTORETIC) 20-25 MG tablet as prescribed- request for refill ordered  - COMPLETE METABOLIC PANEL WITH GFR to be collected in 6 months - CBC with Differential/Platelet to be collected in 6 months - Discussed importance of eating a heart healthy low sodium diet  3. Second degree Mobitz II AV block - s/p pacemaker, followed by Cardiology - He denies any issues with pacemaker   4. Urgency of urination - At baseline - Managed by frequent urination and occasional briefs, denies this issue effecting lifestyle - Has prescription for Solifenacin but does not take due to constipation. Refusing use of stool softener or laxative, does not wish to take this medication unless absolutely necessary   5.  Morbid (severe) obesity due to excess calories (HCC) - BMI 35.7 today, Weight 249 lb. - Discussed importance of physical activity and healthy low fat diet  6. HOH (hard of hearing) - At baseline - Ambulatory referral to Audiology ordered - Patient will return to store where current hearing aids were purchased for refund  7. Gout, unspecified cause, unspecified chronicity, unspecified site - Stable, managing successfully  with dietary modifications - Denies any recent gout flare ups - Taking allopurinol 100 mg tablet only as needed, not daily  - COMPLETE METABOLIC PANEL WITH GFR to be collected in 6 months - Uric Acid to be collected in 6 months  8. Primary osteoarthritis of both knees - Stable, denies any pain   9. Environmental and seasonal allergies - Discussed option of using an antihistamine such as Loratadine if needed - Does not wish to take any additional medication at this time - Remains mindful of environmental triggers    Return in about 6 months (around 09/15/2024) for routine follow up, labs prior to visit.   Lenord Fellers, RN DNP-AGPCNP Student -I personally was present during the history, physical exam and medical decision-making activities of this service and have verified that the service and findings are accurately documented in the student's note [place] K. Biagio Borg [facility] [phone]

## 2024-03-15 NOTE — Telephone Encounter (Signed)
 Eliquis 5mg  refill request received. Patient is 78 years old, weight-113kg, Crea-0.95 on 03/10/24, Diagnosis-Afib & PE, and last seen by Dr. Lewayne Bunting on 10/22/23. Dose is appropriate based on dosing criteria. Will send in refill to requested pharmacy.

## 2024-03-23 ENCOUNTER — Ambulatory Visit: Attending: Nurse Practitioner | Admitting: Audiology

## 2024-03-23 ENCOUNTER — Ambulatory Visit: Admitting: Audiology

## 2024-03-23 DIAGNOSIS — H903 Sensorineural hearing loss, bilateral: Secondary | ICD-10-CM | POA: Insufficient documentation

## 2024-03-23 NOTE — Procedures (Signed)
error 

## 2024-03-25 NOTE — Procedures (Signed)
 Outpatient Audiology and [facility] [address] [place], [place]  [address] [phone]  AUDIOLOGICAL  EVALUATION  NAME: Dean Fry     DOB:   [birth year]      MRN: [id number]                                                                                     DATE: 03/25/2024     REFERENT: Sharon Seller, NP STATUS: Outpatient DIAGNOSIS: Sensorineural hearing loss, bilateral  History: Ovila was seen for an audiological evaluation due to decreased hearing sensitivity.  Jebediah reports he first noted his hearing loss as a teenager.  He reports he did not utilize hearing aids until about 10 years ago.  Sriyan reports he received hearing aids through a vocational program.  His old hearing aids are in the canal style hearing aids.  He reports they are no longer functional.  He reports significant difficulty hearing and communicating in his everyday environment.  Diamante is very motivated to pursue new amplification. He denies otalgia, tinnitus, and aural fullness.   Evaluation:  Otoscopy showed a clear view of the tympanic membranes, bilaterally Tympanometry results were consistent in the right ear with normal tympanic membrane mobility and normal middle ear function (Type A).  Tympanometry in the left ear was attempted however could not be measured, a hermetic seal could not be maintained. Audiometric testing was completed using Conventional Audiometry techniques with insert earphones and TDH headphones. Test results are consistent in the right ear with a moderate sloping to profound sensorineural and mixed hearing loss and are consistent in the left ear with a moderately severe sloping to profound sensorineural and mixed hearing loss.  There is a asymmetry noted at 3000 to 8000 Hz, worse in the left ear.  Mixed hearing loss is noted.  Masked bone-conduction testing could not be completed at 250 to 500 Hz, due to masking dilemma.  Speech Recognition Thresholds were obtained  at 75 dB HL in the right ear and at 70 dB HL in the left ear. Word Recognition Testing was completed at 90 dB HL and Mellody Dance scored a 84% in the right ear and 55% in the left ear.  Asymmetric word recognition is noted  Results:  The test results were reviewed with Mellody Dance. Test results are consistent in the right ear with a moderate sloping to profound sensorineural and mixed hearing loss and are consistent in the left ear with a moderately severe sloping to profound sensorineural and mixed hearing loss.  There is a asymmetry noted at 3000 to 8000 Hz, worse in the left ear.  Itzae will have hearing and communication difficulty in all listening environments.  He will benefit from the use of good communication strategies, the use of amplification.  Armin was counseled regarding being referred for a cochlear implant evaluation.  The cochlear implant evaluation and benefits were discussed extensively with Mellody Dance.  If Miley decides not to pursue a cochlear implant evaluation he was encouraged to schedule an appointment with an audiologist to discuss new hearing aids.  Whose call hide I am finally writing's report  Recommendations: 1.   Referral to an ear nose and throat  physician due to asymmetric hearing loss and for cochlear implant evaluation. 2.   Owain was encouraged to schedule an evaluation at [facility] for a cochlear implant evaluation. 3.   If he decides not to pursue a cochlear implant and communication needs assessment with an audiologist is recommended to discuss hearing aids 4.    Continued audiological monitoring   40 minutes spent testing and counseling on results.   If you have any questions please feel free to contact me at [phone].  Marton Redwood Audiologist, Au.D., CCC-A 03/25/2024  10:07 AM  Cc: Sharon Seller, NP

## 2024-03-30 NOTE — Addendum Note (Signed)
 Addended by: Elease Etienne A on: 03/30/2024 12:56 PM   Modules accepted: Orders

## 2024-03-30 NOTE — Progress Notes (Signed)
 Remote pacemaker transmission.

## 2024-03-30 NOTE — Progress Notes (Signed)
 This encounter was created in error - please disregard.

## 2024-04-26 DIAGNOSIS — M9905 Segmental and somatic dysfunction of pelvic region: Secondary | ICD-10-CM | POA: Diagnosis not present

## 2024-04-26 DIAGNOSIS — M9904 Segmental and somatic dysfunction of sacral region: Secondary | ICD-10-CM | POA: Diagnosis not present

## 2024-04-26 DIAGNOSIS — M9903 Segmental and somatic dysfunction of lumbar region: Secondary | ICD-10-CM | POA: Diagnosis not present

## 2024-04-26 DIAGNOSIS — M5136 Other intervertebral disc degeneration, lumbar region with discogenic back pain only: Secondary | ICD-10-CM | POA: Diagnosis not present

## 2024-05-26 ENCOUNTER — Ambulatory Visit (INDEPENDENT_AMBULATORY_CARE_PROVIDER_SITE_OTHER): Payer: Medicare Other

## 2024-05-26 DIAGNOSIS — I441 Atrioventricular block, second degree: Secondary | ICD-10-CM | POA: Diagnosis not present

## 2024-05-27 ENCOUNTER — Ambulatory Visit: Payer: Self-pay | Admitting: Internal Medicine

## 2024-05-27 LAB — CUP PACEART REMOTE DEVICE CHECK
Battery Remaining Longevity: 75 mo
Battery Remaining Percentage: 77 %
Battery Voltage: 2.98 V
Brady Statistic AP VP Percent: 67 %
Brady Statistic AP VS Percent: 1 %
Brady Statistic AS VP Percent: 33 %
Brady Statistic AS VS Percent: 1 %
Brady Statistic RA Percent Paced: 67 %
Brady Statistic RV Percent Paced: 99 %
Date Time Interrogation Session: 20250528020013
Implantable Lead Connection Status: 753985
Implantable Lead Connection Status: 753985
Implantable Lead Implant Date: 20230518
Implantable Lead Implant Date: 20230518
Implantable Lead Location: 753859
Implantable Lead Location: 753860
Implantable Pulse Generator Implant Date: 20230518
Lead Channel Impedance Value: 430 Ohm
Lead Channel Impedance Value: 450 Ohm
Lead Channel Pacing Threshold Amplitude: 0.75 V
Lead Channel Pacing Threshold Amplitude: 0.75 V
Lead Channel Pacing Threshold Pulse Width: 0.5 ms
Lead Channel Pacing Threshold Pulse Width: 0.5 ms
Lead Channel Sensing Intrinsic Amplitude: 11.3 mV
Lead Channel Sensing Intrinsic Amplitude: 2.3 mV
Lead Channel Setting Pacing Amplitude: 2 V
Lead Channel Setting Pacing Amplitude: 2.5 V
Lead Channel Setting Pacing Pulse Width: 0.5 ms
Lead Channel Setting Sensing Sensitivity: 4 mV
Pulse Gen Model: 2272
Pulse Gen Serial Number: 8081973

## 2024-06-29 DIAGNOSIS — M9903 Segmental and somatic dysfunction of lumbar region: Secondary | ICD-10-CM | POA: Diagnosis not present

## 2024-06-29 DIAGNOSIS — M9905 Segmental and somatic dysfunction of pelvic region: Secondary | ICD-10-CM | POA: Diagnosis not present

## 2024-06-29 DIAGNOSIS — M9904 Segmental and somatic dysfunction of sacral region: Secondary | ICD-10-CM | POA: Diagnosis not present

## 2024-07-13 ENCOUNTER — Other Ambulatory Visit: Payer: Self-pay | Admitting: Family

## 2024-07-13 DIAGNOSIS — E785 Hyperlipidemia, unspecified: Secondary | ICD-10-CM

## 2024-07-16 NOTE — Addendum Note (Signed)
 Addended by: VICCI SELLER A on: 07/16/2024 02:49 PM   Modules accepted: Orders

## 2024-07-16 NOTE — Progress Notes (Signed)
 Remote pacemaker transmission.

## 2024-08-25 ENCOUNTER — Ambulatory Visit (INDEPENDENT_AMBULATORY_CARE_PROVIDER_SITE_OTHER): Payer: Medicare Other

## 2024-08-25 DIAGNOSIS — I441 Atrioventricular block, second degree: Secondary | ICD-10-CM | POA: Diagnosis not present

## 2024-08-25 LAB — CUP PACEART REMOTE DEVICE CHECK
Battery Remaining Longevity: 71 mo
Battery Remaining Percentage: 74 %
Battery Voltage: 2.98 V
Brady Statistic AP VP Percent: 66 %
Brady Statistic AP VS Percent: 1 %
Brady Statistic AS VP Percent: 34 %
Brady Statistic AS VS Percent: 1 %
Brady Statistic RA Percent Paced: 66 %
Brady Statistic RV Percent Paced: 99 %
Date Time Interrogation Session: 20250827020018
Implantable Lead Connection Status: 753985
Implantable Lead Connection Status: 753985
Implantable Lead Implant Date: 20230518
Implantable Lead Implant Date: 20230518
Implantable Lead Location: 753859
Implantable Lead Location: 753860
Implantable Pulse Generator Implant Date: 20230518
Lead Channel Impedance Value: 400 Ohm
Lead Channel Impedance Value: 400 Ohm
Lead Channel Pacing Threshold Amplitude: 0.75 V
Lead Channel Pacing Threshold Amplitude: 0.75 V
Lead Channel Pacing Threshold Pulse Width: 0.5 ms
Lead Channel Pacing Threshold Pulse Width: 0.5 ms
Lead Channel Sensing Intrinsic Amplitude: 12 mV
Lead Channel Sensing Intrinsic Amplitude: 2.1 mV
Lead Channel Setting Pacing Amplitude: 2 V
Lead Channel Setting Pacing Amplitude: 2.5 V
Lead Channel Setting Pacing Pulse Width: 0.5 ms
Lead Channel Setting Sensing Sensitivity: 4 mV
Pulse Gen Model: 2272
Pulse Gen Serial Number: 8081973

## 2024-08-26 ENCOUNTER — Ambulatory Visit: Payer: Self-pay | Admitting: Internal Medicine

## 2024-09-13 ENCOUNTER — Other Ambulatory Visit

## 2024-09-13 DIAGNOSIS — M109 Gout, unspecified: Secondary | ICD-10-CM

## 2024-09-13 DIAGNOSIS — I10 Essential (primary) hypertension: Secondary | ICD-10-CM

## 2024-09-13 DIAGNOSIS — M9904 Segmental and somatic dysfunction of sacral region: Secondary | ICD-10-CM | POA: Diagnosis not present

## 2024-09-13 DIAGNOSIS — M9905 Segmental and somatic dysfunction of pelvic region: Secondary | ICD-10-CM | POA: Diagnosis not present

## 2024-09-13 DIAGNOSIS — M9903 Segmental and somatic dysfunction of lumbar region: Secondary | ICD-10-CM | POA: Diagnosis not present

## 2024-09-13 LAB — COMPREHENSIVE METABOLIC PANEL WITH GFR
AG Ratio: 1.6 (calc) (ref 1.0–2.5)
ALT: 18 U/L (ref 9–46)
AST: 19 U/L (ref 10–35)
Albumin: 4.2 g/dL (ref 3.6–5.1)
Alkaline phosphatase (APISO): 63 U/L (ref 35–144)
BUN: 25 mg/dL (ref 7–25)
CO2: 33 mmol/L — ABNORMAL HIGH (ref 20–32)
Calcium: 9.2 mg/dL (ref 8.6–10.3)
Chloride: 103 mmol/L (ref 98–110)
Creat: 0.98 mg/dL (ref 0.70–1.28)
Globulin: 2.7 g/dL (ref 1.9–3.7)
Glucose, Bld: 92 mg/dL (ref 65–99)
Potassium: 4.1 mmol/L (ref 3.5–5.3)
Sodium: 141 mmol/L (ref 135–146)
Total Bilirubin: 1.1 mg/dL (ref 0.2–1.2)
Total Protein: 6.9 g/dL (ref 6.1–8.1)
eGFR: 79 mL/min/1.73m2 (ref >=60)

## 2024-09-13 LAB — CBC WITH DIFFERENTIAL/PLATELET
Absolute Lymphocytes: 1473 {cells}/uL (ref 850–3900)
Absolute Monocytes: 381 {cells}/uL (ref 200–950)
Basophils Absolute: 39 {cells}/uL (ref 0–200)
Basophils Relative: 0.7 %
Eosinophils Absolute: 101 {cells}/uL (ref 15–500)
Eosinophils Relative: 1.8 %
HCT: 42.4 % (ref 38.5–50.0)
Hemoglobin: 13.7 g/dL (ref 13.2–17.1)
MCH: 29.8 pg (ref 27.0–33.0)
MCHC: 32.3 g/dL (ref 32.0–36.0)
MCV: 92.2 fL (ref 80.0–100.0)
MPV: 11 fL (ref 7.5–12.5)
Monocytes Relative: 6.8 %
Neutro Abs: 3606 {cells}/uL (ref 1500–7800)
Neutrophils Relative %: 64.4 %
Platelets: 192 Thousand/uL (ref 140–400)
RBC: 4.6 Million/uL (ref 4.20–5.80)
RDW: 13.5 % (ref 11.0–15.0)
Total Lymphocyte: 26.3 %
WBC: 5.6 Thousand/uL (ref 3.8–10.8)

## 2024-09-13 LAB — URIC ACID: Uric Acid, Serum: 8.3 mg/dL — ABNORMAL HIGH (ref 4.0–8.0)

## 2024-09-13 NOTE — Progress Notes (Signed)
 Remote PPM Transmission

## 2024-09-14 ENCOUNTER — Ambulatory Visit: Payer: Self-pay | Admitting: Nurse Practitioner

## 2024-09-15 NOTE — Telephone Encounter (Signed)
 Message routed to PCP Caro, Harlene POUR, NP

## 2024-09-16 NOTE — Patient Instructions (Signed)
 1.) Visit your local pharmacy to receive your shingles, tetanus, and covid boosters, if you believe you are already up-to-date, review your personal records and share this information with us  at your next appointment.    Call cardiologist and set up appt with Dr Waddell.   Okay to stop ASA since you are on eliquis 

## 2024-09-17 ENCOUNTER — Encounter: Payer: Self-pay | Admitting: Nurse Practitioner

## 2024-09-17 ENCOUNTER — Ambulatory Visit: Admitting: Nurse Practitioner

## 2024-09-17 VITALS — BP 128/80 | HR 69 | Temp 97.9°F | Ht 70.0 in | Wt 247.0 lb

## 2024-09-17 DIAGNOSIS — E785 Hyperlipidemia, unspecified: Secondary | ICD-10-CM

## 2024-09-17 DIAGNOSIS — I10 Essential (primary) hypertension: Secondary | ICD-10-CM | POA: Diagnosis not present

## 2024-09-17 DIAGNOSIS — M170 Bilateral primary osteoarthritis of knee: Secondary | ICD-10-CM

## 2024-09-17 DIAGNOSIS — Z23 Encounter for immunization: Secondary | ICD-10-CM | POA: Diagnosis not present

## 2024-09-17 DIAGNOSIS — M1A4790 Other secondary chronic gout, unspecified ankle and foot, without tophus (tophi): Secondary | ICD-10-CM | POA: Diagnosis not present

## 2024-09-17 DIAGNOSIS — I441 Atrioventricular block, second degree: Secondary | ICD-10-CM | POA: Diagnosis not present

## 2024-09-17 DIAGNOSIS — I480 Paroxysmal atrial fibrillation: Secondary | ICD-10-CM | POA: Diagnosis not present

## 2024-09-17 DIAGNOSIS — Z95811 Presence of heart assist device: Secondary | ICD-10-CM

## 2024-09-17 DIAGNOSIS — M1A9XX0 Chronic gout, unspecified, without tophus (tophi): Secondary | ICD-10-CM | POA: Insufficient documentation

## 2024-09-17 MED ORDER — ALLOPURINOL 100 MG PO TABS: 100.0000 mg | ORAL_TABLET | Freq: Every day | ORAL | 3 refills | Status: AC
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
## 2024-09-19 DIAGNOSIS — Z95811 Presence of heart assist device: Secondary | ICD-10-CM | POA: Insufficient documentation

## 2024-09-19 NOTE — Assessment & Plan Note (Signed)
S/p pacer  

## 2024-09-19 NOTE — Assessment & Plan Note (Signed)
 Had been off allopurinol  but restarted after recent elevation in uric acid level.  Will follow labs No recent flares

## 2024-09-19 NOTE — Assessment & Plan Note (Signed)
-  education provided on healthy weight loss through increase in physical activity and proper nutrition

## 2024-09-19 NOTE — Assessment & Plan Note (Signed)
 Continues on lipitor and lovaza  with diet modifications Follow lipids

## 2024-09-19 NOTE — Assessment & Plan Note (Signed)
 Rate controlled, continues on eliquis  He is currently on asa as well but having increase in nose bleeds. Okay to stop asa

## 2024-09-19 NOTE — Assessment & Plan Note (Signed)
 Stable at this time. Continue lifestyle modifications and tylenol  PRN

## 2024-09-19 NOTE — Assessment & Plan Note (Signed)
 Followed by cardiology

## 2024-09-19 NOTE — Assessment & Plan Note (Signed)
 Controlled on lisinipril-hydrochlorothiazide  with dietary modifications Follow labs and dietary modifications encouraged

## 2024-09-28 ENCOUNTER — Other Ambulatory Visit: Payer: Self-pay

## 2024-09-28 DIAGNOSIS — I2693 Single subsegmental pulmonary embolism without acute cor pulmonale: Secondary | ICD-10-CM

## 2024-09-28 MED ORDER — APIXABAN 5 MG PO TABS: 5.0000 mg | ORAL_TABLET | Freq: Two times a day (BID) | ORAL | 1 refills | Status: DC

## 2024-09-28 NOTE — Telephone Encounter (Signed)
 Prescription refill request for Eliquis  received. Indication:pe Last office visit:10/24 Scr:0.98  9/25 Age: 78 Weight:112  kg  Prescription refilled

## 2024-10-04 DIAGNOSIS — M9904 Segmental and somatic dysfunction of sacral region: Secondary | ICD-10-CM | POA: Diagnosis not present

## 2024-10-04 DIAGNOSIS — M9903 Segmental and somatic dysfunction of lumbar region: Secondary | ICD-10-CM | POA: Diagnosis not present

## 2024-10-04 DIAGNOSIS — M5136 Other intervertebral disc degeneration, lumbar region with discogenic back pain only: Secondary | ICD-10-CM | POA: Diagnosis not present

## 2024-10-04 DIAGNOSIS — M9905 Segmental and somatic dysfunction of pelvic region: Secondary | ICD-10-CM | POA: Diagnosis not present

## 2024-10-07 DIAGNOSIS — H5203 Hypermetropia, bilateral: Secondary | ICD-10-CM | POA: Diagnosis not present

## 2024-10-22 ENCOUNTER — Encounter: Payer: Self-pay | Admitting: Nurse Practitioner

## 2024-10-22 ENCOUNTER — Ambulatory Visit: Payer: Medicare Other | Admitting: Nurse Practitioner

## 2024-10-22 ENCOUNTER — Encounter: Payer: Self-pay | Admitting: Internal Medicine

## 2024-10-22 ENCOUNTER — Ambulatory Visit: Attending: Internal Medicine | Admitting: Internal Medicine

## 2024-10-22 VITALS — BP 124/76 | HR 68 | Temp 98.0°F | Resp 20 | Ht 70.0 in | Wt 244.4 lb

## 2024-10-22 VITALS — BP 124/76 | HR 64 | Ht 70.0 in

## 2024-10-22 DIAGNOSIS — I480 Paroxysmal atrial fibrillation: Secondary | ICD-10-CM

## 2024-10-22 DIAGNOSIS — Z0000 Encounter for general adult medical examination without abnormal findings: Secondary | ICD-10-CM | POA: Diagnosis not present

## 2024-10-22 DIAGNOSIS — Z95811 Presence of heart assist device: Secondary | ICD-10-CM

## 2024-10-22 DIAGNOSIS — I441 Atrioventricular block, second degree: Secondary | ICD-10-CM | POA: Diagnosis not present

## 2024-10-22 LAB — CUP PACEART INCLINIC DEVICE CHECK
Battery Remaining Longevity: 67 mo
Battery Voltage: 2.98 V
Brady Statistic RA Percent Paced: 64 %
Brady Statistic RV Percent Paced: 99.66 %
Date Time Interrogation Session: 20251024125809
Implantable Lead Connection Status: 753985
Implantable Lead Connection Status: 753985
Implantable Lead Implant Date: 20230518
Implantable Lead Implant Date: 20230518
Implantable Lead Location: 753859
Implantable Lead Location: 753860
Implantable Pulse Generator Implant Date: 20230518
Lead Channel Impedance Value: 425 Ohm
Lead Channel Impedance Value: 425 Ohm
Lead Channel Pacing Threshold Amplitude: 0.75 V
Lead Channel Pacing Threshold Amplitude: 0.75 V
Lead Channel Pacing Threshold Amplitude: 0.75 V
Lead Channel Pacing Threshold Amplitude: 0.75 V
Lead Channel Pacing Threshold Pulse Width: 0.5 ms
Lead Channel Pacing Threshold Pulse Width: 0.5 ms
Lead Channel Pacing Threshold Pulse Width: 0.5 ms
Lead Channel Pacing Threshold Pulse Width: 0.5 ms
Lead Channel Sensing Intrinsic Amplitude: 3.3 mV
Lead Channel Setting Pacing Amplitude: 2 V
Lead Channel Setting Pacing Amplitude: 2.5 V
Lead Channel Setting Pacing Pulse Width: 0.5 ms
Lead Channel Setting Sensing Sensitivity: 4 mV
Pulse Gen Model: 2272
Pulse Gen Serial Number: 8081973

## 2024-10-22 NOTE — Patient Instructions (Signed)

## 2024-10-22 NOTE — Progress Notes (Signed)
 HPI Dean Fry returns today for PPM followup. He is a pleasant 78 yo man with a h/o CHB, s/p PPM insertion about 2 years ago. In the interim he has done well. He has a remote h/o PE. He has a remote thoracotomy for an ASD repair. He has been active working for the [employer] at the recent [employer] event. He has some dyspnea with exertion but this does not limit his activity. No edema. No syncope and no palpitationsl. No Known Allergies   Current Outpatient Medications  Medication Sig Dispense Refill   allopurinol  (ZYLOPRIM ) 100 MG tablet Take 1 tablet (100 mg total) by mouth daily. 90 tablet 3   apixaban  (ELIQUIS ) 5 MG TABS tablet Take 1 tablet (5 mg total) by mouth 2 (two) times daily. 180 tablet 1   atorvastatin  (LIPITOR) 40 MG tablet TAKE 1 TABLET BY MOUTH DAILY 90 tablet 1   lisinopril -hydrochlorothiazide  (ZESTORETIC ) 20-25 MG tablet Take 1 tablet by mouth daily. 90 tablet 3   omega-3 acid ethyl esters (LOVAZA ) 1 g capsule Take 1 capsule (1 g total) by mouth 3 (three) times daily. 270 capsule 3   OVER THE COUNTER MEDICATION      No current facility-administered medications for this visit.     Past Medical History:  Diagnosis Date   Atrial septal defect    corrected in 1962   Cataracts, bilateral    early stages   Encounter for care of pacemaker 05/19/2022   Hypercholesteremia    Hypertension    Sees Dr. 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  05/19/2022   Rheumatic fever    at 78 years old   Second degree Mobitz II AV block 05/19/2022   Shoulder pain    left, sees chiropracter for    Stroke (HCC) 03/14/2008   pt states that he may have had a mild stroke 1 week after knee surgery    ROS:   All systems reviewed and negative except as noted in the HPI.   Past Surgical History:  Procedure Laterality Date   APPENDECTOMY   1979   ASD REPAIR     ASD REPAIR     CATARACT EXTRACTION Left    May 2017   CATARACT EXTRACTION Right    May 2017   KNEE SURGERY     4 surgeries on right knee   PACEMAKER IMPLANT N/A 05/16/2022   Procedure: PACEMAKER IMPLANT;  Surgeon: Waddell Danelle ORN, MD;  Location: [facility];  Service: Cardiovascular;  Laterality: N/A;   plantar fascititis     bilateral surgery    TONSILLECTOMY  1954   and removal of adnoids   TOTAL KNEE ARTHROPLASTY  2009   right knee, Dr.Randal    TOTAL KNEE ARTHROPLASTY  01/15/2013   Procedure: TOTAL KNEE ARTHROPLASTY;  Surgeon: ORN JONETTA Shari Mickey., MD;  Location: [facility] OR;  Service: Orthopedics;  Laterality: Left;  left total knee arthroplasty     Family History  Adopted: Yes  Problem Relation Age of Onset   Heart attack Father    Pancreatic cancer Brother      Social History   Socioeconomic History   Marital status: Divorced    Spouse name: Not on file   Number of children: 0   Years of  education: Not on file   Highest education level: Bachelor's degree (e.g., BA, AB, BS)  Occupational History   Not on file  Tobacco Use   Smoking status: Never   Smokeless tobacco: Never  Vaping Use   Vaping status: Never Used  Substance and Sexual Activity   Alcohol use: Yes    Alcohol/week: 2.0 standard drinks of alcohol    Types: 2 Standard drinks or equivalent per week    Comment: occasional use   Drug use: No   Sexual activity: Not Currently  Other Topics Concern   Not on file  Social History Narrative   Not on file   Social Drivers of Health   Financial Resource Strain: Low Risk  (09/13/2024)   Overall Financial Resource Strain (CARDIA)    Difficulty of Paying Living Expenses: Not very hard  Food Insecurity: No Food Insecurity (10/22/2024)   Hunger Vital Sign    Worried About Running Out of Food in the Last Year: Never true    Ran Out of Food in the Last Year: Never true  Transportation Needs: No Transportation Needs (10/22/2024)    PRAPARE - Administrator, Civil Service (Medical): No    Lack of Transportation (Non-Medical): No  Physical Activity: Sufficiently Active (09/13/2024)   Exercise Vital Sign    Days of Exercise per Week: 5 days    Minutes of Exercise per Session: 30 min  Stress: No Stress Concern Present (09/13/2024)   [employer] of Occupational Health - Occupational Stress Questionnaire    Feeling of Stress: Not at all  Social Connections: Moderately Integrated (09/13/2024)   Social Connection and Isolation Panel    Frequency of Communication with Friends and Family: More than three times a week    Frequency of Social Gatherings with Friends and Family: Once a week    Attends Religious Services: 1 to 4 times per year    Active Member of [employer] or Organizations: Yes    Attends Engineer, structural: More than 4 times per year    Marital Status: Divorced  Intimate Partner Violence: Not At Risk (10/22/2024)   Humiliation, Afraid, Rape, and Kick questionnaire    Fear of Current or Ex-Partner: No    Emotionally Abused: No    Physically Abused: No    Sexually Abused: No     BP 124/76   Pulse 64   Ht 5' 10 (1.778 m)   SpO2 94%   BMI 35.07 kg/m   Physical Exam:  Well appearing 78 yo man, NAD HEENT: Unremarkable Neck:  No JVD, no thyromegally Lymphatics:  No adenopathy Back:  No CVA tenderness Lungs:  Clear with no wheezes HEART:  Regular rate rhythm, no murmurs, no rubs, no clicks Abd:  soft, positive bowel sounds, no organomegally, no rebound, no guarding Ext:  2 plus pulses, no edema, no cyanosis, no clubbing Skin:  No rashes no nodules Neuro:  CN II through XII intact, motor grossly intact  EKG - nsr with p synch ventricular pacing  DEVICE  Normal device function.  See PaceArt for details.   Assess/Plan:  Heart block - he is stable s/p PPM insertion and is asymptomatic. Dyspnea - he has class 2 symptoms. I encouraged him to start walking.  Pulmonary embolism  - he remains on eliquis .  Continue.   4. PAF - he is maintaining NSR over 99% of the time. Continue systemic anti-coagulation.    Danelle Nylee Barbuto,MD

## 2024-10-22 NOTE — Patient Instructions (Addendum)
  Mr. Blanchette , Thank you for taking time to come for your Medicare Wellness Visit. I appreciate your ongoing commitment to your health goals. Please review the following plan we discussed and let me know if I can assist you in the future.   To get TDAP and shingles vaccine at local pharmacy To get COVID booster at pharmacy    This is a list of the screening recommended for you and due dates:  Health Maintenance  Topic Date Due   DTaP/Tdap/Td vaccine (2 - Td or Tdap) 02/26/2022   COVID-19 Vaccine (5 - 2025-26 season) 11/08/2027*   Zoster (Shingles) Vaccine (2 of 2) 01/23/2028*   Medicare Annual Wellness Visit  10/22/2025   Pneumococcal Vaccine for age over 20  Completed   Flu Shot  Completed   Hepatitis C Screening  Completed   Meningitis B Vaccine  Aged Out   Colon Cancer Screening  Discontinued  *Topic was postponed. The date shown is not the original due date.

## 2024-10-22 NOTE — Progress Notes (Signed)
 Subjective:   Dean Fry is a 78 y.o. male who presents for Medicare Annual/Subsequent preventive examination.  Visit Complete: In person [facility] clinic     Cardiac Risk Factors include: advanced age (>52men, >63 women);hypertension;male gender     Objective:    Today's Vitals   10/22/24 0848  BP: 124/76  Pulse: 68  Resp: 20  Temp: 98 F (36.7 C)  SpO2: 94%  Weight: 244 lb 6.4 oz (110.9 kg)  Height: 5' 10 (1.778 m)   Body mass index is 35.07 kg/m.     10/22/2024    8:40 AM 03/12/2024    4:55 PM 10/17/2023    8:56 AM 09/15/2023    8:21 AM 05/16/2022   10:02 AM 10/27/2020   10:43 AM 05/15/2020    8:47 AM  Advanced Directives  Does Patient Have a Medical Advance Directive? Yes Yes Yes Yes Yes Yes Yes  Type of Advance Directive Out of facility DNR (pink MOST or yellow form) Out of facility DNR (pink MOST or yellow form) Living will;Out of facility DNR (pink MOST or yellow form) Healthcare Power of [place]ode;Living will Living will Out of facility DNR (pink MOST or yellow form) Out of facility DNR (pink MOST or yellow form)  Does patient want to make changes to medical advance directive? No - Patient declined No - Patient declined No - Patient declined Yes (Inpatient - patient defers changing a medical advance directive at this time - Information given) No - Patient declined No - Patient declined No - Patient declined  Copy of Healthcare Power of Attorney in Chart?   No - copy requested Yes - validated most recent copy scanned in chart (See row information)     Pre-existing out of facility DNR order (yellow form or pink MOST form)  Yellow form placed in chart (order not valid for inpatient use);Pink MOST form placed in chart (order not valid for inpatient use) Yellow form placed in chart (order not valid for inpatient use)        Current Medications (verified) Outpatient Encounter Medications as of 10/22/2024  Medication Sig   allopurinol  (ZYLOPRIM ) 100 MG tablet Take 1  tablet (100 mg total) by mouth daily.   apixaban  (ELIQUIS ) 5 MG TABS tablet Take 1 tablet (5 mg total) by mouth 2 (two) times daily.   atorvastatin  (LIPITOR) 40 MG tablet TAKE 1 TABLET BY MOUTH DAILY   lisinopril -hydrochlorothiazide  (ZESTORETIC ) 20-25 MG tablet Take 1 tablet by mouth daily.   omega-3 acid ethyl esters (LOVAZA ) 1 g capsule Take 1 capsule (1 g total) by mouth 3 (three) times daily.   OVER THE COUNTER MEDICATION    [DISCONTINUED] aspirin  81 MG tablet Take 1 tablet (81 mg total) by mouth daily.   No facility-administered encounter medications on file as of 10/22/2024.    Allergies (verified) Patient has no known allergies.   History: Past Medical History:  Diagnosis Date   Atrial septal defect    corrected in 1962   Cataracts, bilateral    early stages   Encounter for care of pacemaker 05/19/2022   Hypercholesteremia    Hypertension    Sees Dr. 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   Shoulder pain    left, sees chiropracter for    Stroke [facility]) 03/14/2008   pt states that he may have had a mild stroke 1 week after knee surgery   Past Surgical History:  Procedure Laterality Date   APPENDECTOMY  1979   ASD REPAIR     ASD REPAIR     CATARACT EXTRACTION Left    May 2017   CATARACT EXTRACTION Right    May 2017   KNEE SURGERY     4 surgeries on right knee   PACEMAKER IMPLANT N/A 05/16/2022   Procedure: PACEMAKER IMPLANT;  Surgeon: Waddell Danelle ORN, MD;  Location: [facility] INVASIVE CV LAB;  Service: Cardiovascular;  Laterality: N/A;   plantar fascititis     bilateral surgery    TONSILLECTOMY  1954   and removal of adnoids   TOTAL KNEE ARTHROPLASTY  2009   right knee, Dr.Randal    TOTAL KNEE ARTHROPLASTY  01/15/2013   Procedure:  TOTAL KNEE ARTHROPLASTY;  Surgeon: ORN JONETTA Shari Mickey., MD;  Location: [facility] OR;  Service: Orthopedics;  Laterality: Left;  left total knee arthroplasty   Family History  Adopted: Yes  Problem Relation Age of Onset   Heart attack Father    Pancreatic cancer Brother    Social History   Socioeconomic History   Marital status: Divorced    Spouse name: Not on file   Number of children: 0   Years of education: Not on file   Highest education level: Bachelor's degree (e.g., BA, AB, BS)  Occupational History   Not on file  Tobacco Use   Smoking status: Never   Smokeless tobacco: Never  Vaping Use   Vaping status: Never Used  Substance and Sexual Activity   Alcohol use: Yes    Alcohol/week: 2.0 standard drinks of alcohol    Types: 2 Standard drinks or equivalent per week    Comment: occasional use   Drug use: No   Sexual activity: Not Currently  Other Topics Concern   Not on file  Social History Narrative   Not on file   Social Drivers of Health   Financial Resource Strain: Low Risk  (09/13/2024)   Overall Financial Resource Strain (CARDIA)    Difficulty of Paying Living Expenses: Not very hard  Food Insecurity: No Food Insecurity (10/22/2024)   Hunger Vital Sign    Worried About Running Out of Food in the Last Year: Never true    Ran Out of Food in the Last Year: Never true  Transportation Needs: No Transportation Needs (10/22/2024)   PRAPARE - Administrator, Civil Service (Medical): No    Lack of Transportation (Non-Medical): No  Physical Activity: Sufficiently Active (09/13/2024)   Exercise Vital Sign    Days of Exercise per Week: 5 days    Minutes of Exercise per Session: 30 min  Stress: No Stress Concern Present (09/13/2024)   [employer] of Occupational Health - Occupational Stress Questionnaire    Feeling of Stress: Not at all  Social Connections: Moderately Integrated (09/13/2024)   Social Connection and Isolation Panel    Frequency of Communication  with Friends and Family: More than three times a week    Frequency of Social Gatherings with Friends and Family: Once a week    Attends Religious Services: 1 to 4 times per year    Active Member of [employer] or Organizations: Yes    Attends Banker Meetings: More than 4 times per  year    Marital Status: Divorced    Tobacco Counseling Counseling given: Not Answered   Clinical Intake:  Pre-visit preparation completed: Yes  Pain : No/denies pain     BMI - recorded: 35 Nutritional Status: BMI > 30  Obese Diabetes: No  How often do you need to have someone help you when you read instructions, pamphlets, or other written materials from your doctor or pharmacy?: 1 - Never         Activities of Daily Living    10/22/2024    8:49 AM 10/18/2024   11:39 AM  In your present state of health, do you have any difficulty performing the following activities:  Hearing? 1 1  Vision? 0 0  Difficulty concentrating or making decisions? 0 0  Walking or climbing stairs? 0 0  Dressing or bathing? 0 0  Doing errands, shopping? 0 0  Preparing Food and eating ? N N  Using the Toilet? N N  In the past six months, have you accidently leaked urine? Y Y  Do you have problems with loss of bowel control? N N  Managing your Medications? N N  Managing your Finances? N N  Housekeeping or managing your Housekeeping? N N    Patient Care Team: Caro Harlene POUR, NP as PCP - General (Geriatric Medicine) Livingston Rigg, MD as Consulting Physician (Dermatology) Waddell Danelle ORN, MD as Consulting Physician (Cardiology)  Indicate any recent Medical Services you may have received from other than [facility] providers in the past year (date may be approximate).     Assessment:   This is a routine wellness examination for Dean Fry.  Hearing/Vision screen Hearing Screening - Comments:: Some hearing issues pt wear hearing aids. Vision Screening - Comments:: Eye exam a month ago ( Dr.Gould)    Goals  Addressed   None    Depression Screen    10/22/2024    8:41 AM 10/17/2023    8:58 AM 09/15/2023    8:21 AM 03/14/2023    9:25 AM 11/20/2020    8:35 AM 10/27/2020   10:37 AM 01/26/2020   11:00 AM  PHQ 2/9 Scores  PHQ - 2 Score 0 0 0 0 0 0 0    Fall Risk    10/22/2024    8:41 AM 10/18/2024   11:39 AM 03/15/2024    8:32 AM 10/17/2023    8:58 AM 10/13/2023    8:48 AM  Fall Risk   Falls in the past year? 0 1 1 1  0  Comment   Tripped    Number falls in past yr: 0 0 0 0 0  Injury with Fall? 0 0 0 0 0  Risk for fall due to : No Fall Risks  No Fall Risks History of fall(s)   Follow up Falls evaluation completed  Falls evaluation completed Falls evaluation completed     MEDICARE RISK AT HOME: Medicare Risk at Home Any stairs in or around the home?: Yes If so, are there any without handrails?: No Home free of loose throw rugs in walkways, pet beds, electrical cords, etc?: Yes Adequate lighting in your home to reduce risk of falls?: Yes Life alert?: No Use of a cane, walker or w/c?: No Grab bars in the bathroom?: Yes Shower chair or bench in shower?: No Elevated toilet seat or a handicapped toilet?: No  TIMED UP AND GO:  Was the test performed?  No    Cognitive Function:    10/17/2023    9:03 AM 10/25/2019  8:32 AM 04/23/2018    2:04 PM 03/21/2017    9:00 AM 09/08/2015    8:41 AM  MMSE - Mini Mental State Exam  Orientation to time 5 5 5 5  5    Orientation to Place 5 5 5 5  5    Registration 3 3 3 3  3    Attention/ Calculation 5 5 5 3  3    Recall 3 3 3 3  2    Language- name 2 objects 2 2 2 2  2    Language- repeat 1 1 1 1 1   Language- follow 3 step command 3 3 3 3  3    Language- read & follow direction 1 1 1 1  1    Write a sentence 1 1 1 1  1    Copy design 1 1 1 1  1    Total score 30 30 30 28  27       Data saved with a previous flowsheet row definition        10/22/2024    8:42 AM 10/27/2020   10:38 AM  6CIT Screen  What Year? 0 points 0 points  What month?  0 points 0 points  What time? 0 points 0 points  Count back from 20 0 points 0 points  Months in reverse 0 points 0 points  Repeat phrase 0 points 0 points  Total Score 0 points 0 points    Immunizations Immunization History  Administered Date(s) Administered   Fluad Quad(high Dose 65+) 09/24/2019, 11/20/2020   Fluad Trivalent(High Dose 65+) 09/15/2023   INFLUENZA, HIGH DOSE SEASONAL PF 09/29/2017, 10/26/2018, 09/17/2024   Influenza Whole 08/29/2011, 12/03/2012   Influenza,inj,Quad PF,6+ Mos 12/09/2013, 09/02/2014, 09/08/2015, 11/25/2016   Influenza-Unspecified 09/13/2022   Moderna Sars-Covid-2 Vaccination 03/06/2020, 04/07/2020, 11/03/2020   Pfizer(Comirnaty)Fall Seasonal Vaccine 12 years and older 10/17/2023   Pneumococcal Conjugate-13 01/06/2015   Pneumococcal Polysaccharide-23 02/27/2012   Tdap 02/27/2012   Zoster Recombinant(Shingrix) 10/17/2023   Zoster, Live 05/28/2012    TDAP status: Due, Education has been provided regarding the importance of this vaccine. Advised may receive this vaccine at local pharmacy or Health Dept. Aware to provide a copy of the vaccination record if obtained from local pharmacy or Health Dept. Verbalized acceptance and understanding.  Flu Vaccine status: Up to date  Pneumococcal vaccine status: Up to date  Covid-19 vaccine status: Information provided on how to obtain vaccines.   Qualifies for Shingles Vaccine? Yes   Zostavax completed No   Shingrix Completed?: No.    Education has been provided regarding the importance of this vaccine. Patient has been advised to call insurance company to determine out of pocket expense if they have not yet received this vaccine. Advised may also receive vaccine at local pharmacy or Health Dept. Verbalized acceptance and understanding.  Screening Tests Health Maintenance  Topic Date Due   DTaP/Tdap/Td (2 - Td or Tdap) 02/26/2022   COVID-19 Vaccine (5 - 2025-26 season) 11/08/2027 (Originally 08/30/2024)    Zoster Vaccines- Shingrix (2 of 2) 01/23/2028 (Originally 12/12/2023)   Medicare Annual Wellness (AWV)  10/22/2025   Pneumococcal Vaccine: 50+ Years  Completed   Influenza Vaccine  Completed   Hepatitis C Screening  Completed   Meningococcal B Vaccine  Aged Out   Colonoscopy  Discontinued    Health Maintenance  Health Maintenance Due  Topic Date Due   DTaP/Tdap/Td (2 - Td or Tdap) 02/26/2022    Colorectal cancer screening: No longer required.   Lung Cancer Screening: (Low Dose CT Chest recommended  if Age 81-80 years, 20 pack-year currently smoking OR have quit w/in 15years.) does not qualify.   Lung Cancer Screening Referral: na  Additional Screening:  Hepatitis C Screening: does qualify; Completed   Vision Screening: Recommended annual ophthalmology exams for early detection of glaucoma and other disorders of the eye. Is the patient up to date with their annual eye exam?  Yes  Who is the provider or what is the name of the office in which the patient attends annual eye exams? [facility] If pt is not established with a provider, would they like to be referred to a provider to establish care? No .   Dental Screening: Recommended annual dental exams for proper oral hygiene  Community Resource Referral / Chronic Care Management: CRR required this visit?  No   CCM required this visit?  No     Plan:     I have personally reviewed and noted the following in the patient's chart:   Medical and social history Use of alcohol, tobacco or illicit drugs  Current medications and supplements including opioid prescriptions. Patient is not currently taking opioid prescriptions. Functional ability and status Nutritional status Physical activity Advanced directives List of other physicians Hospitalizations, surgeries, and ER visits in previous 12 months Vitals Screenings to include cognitive, depression, and falls Referrals and appointments  In addition, I have reviewed and  discussed with patient certain preventive protocols, quality metrics, and best practice recommendations. A written personalized care plan for preventive services as well as general preventive health recommendations were provided to patient.     Harlene MARLA An, NP   10/22/2024

## 2024-11-15 DIAGNOSIS — M9903 Segmental and somatic dysfunction of lumbar region: Secondary | ICD-10-CM | POA: Diagnosis not present

## 2024-11-15 DIAGNOSIS — M9905 Segmental and somatic dysfunction of pelvic region: Secondary | ICD-10-CM | POA: Diagnosis not present

## 2024-11-15 DIAGNOSIS — M5136 Other intervertebral disc degeneration, lumbar region with discogenic back pain only: Secondary | ICD-10-CM | POA: Diagnosis not present

## 2024-11-15 DIAGNOSIS — M9904 Segmental and somatic dysfunction of sacral region: Secondary | ICD-10-CM | POA: Diagnosis not present

## 2024-11-24 ENCOUNTER — Ambulatory Visit: Payer: Medicare Other

## 2024-12-18 ENCOUNTER — Other Ambulatory Visit: Payer: Self-pay | Admitting: Nurse Practitioner

## 2024-12-18 DIAGNOSIS — E785 Hyperlipidemia, unspecified: Secondary | ICD-10-CM

## 2025-01-10 ENCOUNTER — Telehealth: Payer: Self-pay

## 2025-01-10 DIAGNOSIS — I2693 Single subsegmental pulmonary embolism without acute cor pulmonale: Secondary | ICD-10-CM

## 2025-01-10 MED ORDER — APIXABAN 5 MG PO TABS: 5.0000 mg | ORAL_TABLET | Freq: Two times a day (BID) | ORAL | 1 refills | Status: AC

## 2025-01-10 NOTE — Telephone Encounter (Signed)
 Patient walk into the office stating that he is out of refills for Eliquis  and that he also stated that pharmacy needs something from the doctor's office. I did advise that I will call the pharmacy, Rx refill has been sent to the pharmacy.

## 2025-02-23 ENCOUNTER — Ambulatory Visit: Payer: Medicare Other

## 2025-03-11 ENCOUNTER — Other Ambulatory Visit: Payer: Self-pay

## 2025-03-18 ENCOUNTER — Ambulatory Visit: Payer: Self-pay | Admitting: Nurse Practitioner

## 2025-05-25 ENCOUNTER — Ambulatory Visit

## 2025-08-24 ENCOUNTER — Ambulatory Visit

## 2025-10-24 ENCOUNTER — Ambulatory Visit: Payer: Self-pay | Admitting: Nurse Practitioner

## 2025-11-23 ENCOUNTER — Ambulatory Visit

## 2026-02-22 ENCOUNTER — Ambulatory Visit

## 2026-05-24 ENCOUNTER — Ambulatory Visit
